# Patient Record
Sex: Male | Born: 1955
Health system: Southern US, Community
[De-identification: ages and names within clinical notes are randomized; demographics above are authoritative.]

## PROBLEM LIST (undated history)

## (undated) DIAGNOSIS — I10 Essential (primary) hypertension: Secondary | ICD-10-CM

## (undated) DIAGNOSIS — M1990 Unspecified osteoarthritis, unspecified site: Secondary | ICD-10-CM

---

## 2001-02-24 ENCOUNTER — Ambulatory Visit (HOSPITAL_COMMUNITY): Admission: RE | Admit: 2001-02-24 | Discharge: 2001-02-24 | Payer: Self-pay | Admitting: *Deleted

## 2004-09-23 ENCOUNTER — Emergency Department (HOSPITAL_COMMUNITY): Admission: EM | Admit: 2004-09-23 | Discharge: 2004-09-23 | Payer: Self-pay | Admitting: Emergency Medicine

## 2005-03-31 ENCOUNTER — Encounter: Admission: RE | Admit: 2005-03-31 | Discharge: 2005-03-31 | Payer: Self-pay | Admitting: Internal Medicine

## 2009-07-07 ENCOUNTER — Ambulatory Visit (HOSPITAL_COMMUNITY): Admission: RE | Admit: 2009-07-07 | Discharge: 2009-07-07 | Payer: Self-pay | Admitting: *Deleted

## 2009-07-07 ENCOUNTER — Encounter (INDEPENDENT_AMBULATORY_CARE_PROVIDER_SITE_OTHER): Payer: Self-pay | Admitting: *Deleted

## 2011-05-04 NOTE — Op Note (Signed)
NAME:  Tim [name], Tim [name] NO.:  [id number]   MEDICAL RECORD NO.:  [id number]          PATIENT TYPE:  AMB   LOCATION:  ENDO                         FACILITY:  [facility]   PHYSICIAN:  Georgiana Spinner, M.D.    DATE OF BIRTH:  10/05/1956   DATE OF PROCEDURE:  07/07/2009  DATE OF DISCHARGE:                               OPERATIVE REPORT   PROCEDURE:  Colonoscopy.   INDICATIONS:  Colon polyps.   ANESTHESIA:  1. Fentanyl 50 mcg.  2. Versed 5 mg.   PROCEDURE:  With the patient mildly sedated in the left lateral  decubitus position, a rectal examination was attempted.  Subsequently,  the Pentax videoscopic pediatric colonoscope was inserted in the rectum  and passed under direct vision to the cecum identified by the ileocecal  valve and appendiceal orifice, both of which were photographed. From  this point, the colonoscope was slowly withdrawn taking circumferential  views of the colonic mucosa, stopping only in the transverse colon were  a polyp was seen, photographed, and removed using snare cautery  technique.  The setting was 20/150 blended current.  Tissue was  retrieved by suctioning it into the endoscope into a tissue trap.  The  endoscope was then withdrawn all the way to the rectum, which appeared  normal on direct and showed hemorrhoids on retroflexed view.  The  endoscope was straightened and withdrawn.  The patient's vital signs and  pulse oximeter remained stable.  The patient tolerated the procedure  well with no apparent complication.   FINDINGS:  Polyp of distal transverse colon, internal hemorrhoids;  otherwise, unremarkable exam.  Await biopsy report.  The patient will  call me for results and follow up with me as an outpatient as needed.           ______________________________  Georgiana Spinner, M.D.     GMO/MEDQ  D:  07/07/2009  T:  07/07/2009  Job:  [id number]

## 2011-05-07 NOTE — Procedures (Signed)
[facility]  Patient:    Tim [name], Tim [name]                         MRN: [id number] Proc. Date: 02/24/01 Adm. Date:  [id number] Attending:  Sabino Gasser                           Procedure Report  PROCEDURE:  Colonoscopy.  INDICATIONS FOR PROCEDURE:  Hemoccult positivity.  ANESTHESIA:  Demerol 25, Versed 2 extra milligrams.  DESCRIPTION OF PROCEDURE:  With the patient mildly sedated in the left lateral decubitus position, the Olympus videoscopic colonoscope was inserted in the rectum and passed under direct vision to the cecum. The cecum identified by the ileocecal valve and appendiceal orifice both of which were photographed. From this point, the colonoscope was slowly withdrawn taking circumferential views of the entire colonic mucosa stopping only in the rectum which appeared normal in direct and showed internal hemorrhoids on retroflexed view. The endoscope was straightened and withdrawn. The patients vital signs and pulse oximeter stable. The patient tolerated the procedure well without apparent complications.  FINDINGS:  Internal hemorrhoids otherwise unremarkable colonoscopic examination to the cecum.  PLAN:  Have the patient follow-up with me as needed. DD:  02/24/01 TD:  02/25/01 Job: [id number]

## 2011-05-07 NOTE — Procedures (Signed)
[facility]  Patient:    FIN, HUPP                         MRN: [id number] Proc. Date: 02/24/01 Adm. Date:  [id number] Attending:  Sabino Gasser                           Procedure Report  PROCEDURE:  Upper endoscopy.  INDICATION FOR PROCEDURE:  Hemoccult positivity.  ANESTHESIA:  Demerol 50 mg, Versed 5 mg.  DESCRIPTION OF PROCEDURE:  With the patient mildly sedated in the left lateral decubitus position, the Olympus video endoscope was inserted in the mouth and passed under direct vision through the esophagus which appeared normal into the stomach. The fundus, body, antrum, duodenal bulb, and second portion of the duodenum all appeared normal. From this point, the endoscope was slowly withdrawn taking circumferential views of the entire duodenal mucosa until the endoscope was then pulled back into the stomach, placed in retroflexion to view the stomach from below and this too appeared normal. The endoscope was then straightened and withdrawn taking circumferential views of the entire gastric and esophageal mucosa which also appeared normal. The patients vital signs and pulse oximeter remained stable. The patient tolerated the procedure well and there were no apparent complications.  FINDINGS:  Essentially negative endoscopic examination.  PLAN:  Proceed to colonoscopy. DD:  02/24/01 TD:  02/25/01 Job: [id number]

## 2011-10-19 ENCOUNTER — Other Ambulatory Visit: Payer: Self-pay | Admitting: Internal Medicine

## 2011-10-19 ENCOUNTER — Ambulatory Visit
Admission: RE | Admit: 2011-10-19 | Discharge: 2011-10-19 | Disposition: A | Discharge status: Home / Self Care | Payer: Managed Care, Other (non HMO) | Source: Ambulatory Visit | Attending: Internal Medicine | Admitting: Internal Medicine

## 2011-10-19 MED ORDER — IOHEXOL 300 MG/ML  SOLN
100.0000 mL | Freq: Once | INTRAMUSCULAR | Status: AC | PRN
  Administered 2011-10-19: 100 mL via INTRAVENOUS

## 2015-07-10 ENCOUNTER — Ambulatory Visit: Payer: Self-pay | Admitting: Orthopedic Surgery

## 2015-07-10 NOTE — Progress Notes (Signed)
Preoperative surgical orders have been place into the Epic hospital system for Tim [name] on 07/10/2015, 6:07 PM  by Patrica Duel for surgery on 07/28/2015.  Preop Total Knee orders including Experal, IV Tylenol, and IV Decadron as long as there are no contraindications to the above medications. Avel Peace, PA-C

## 2015-07-17 NOTE — Patient Instructions (Addendum)
Tim [name]  07/17/2015   Your procedure is scheduled on:  Monday 07/28/2015  Report to [facility] Main  Entrance take [facility]  elevators to 3rd floor to  Short Stay Center at  1045 AM.  Call this number if you have problems the morning of surgery [phone]   Remember: ONLY 1 PERSON MAY GO WITH YOU TO SHORT STAY TO GET  READY MORNING OF YOUR SURGERY.   Do not eat food  :After Midnight. MAY HAVE CLEAR LIQUIDS FROM MIDNIGHT UP UNTIL 0745 AM THEN NOTHING UNTIL AFTER SURGERY!               Eat a good healthy snack prior to bedtime.   Take these medicines the morning of surgery with A SIP OF WATER: AMLODIPINE                               You may not have any metal on your body including hair pins and              piercings  Do not wear jewelry,  lotions, powders or perfumes, deodorant                         Men may shave face and neck.   Do not bring valuables to the hospital. [facility] IS NOT             RESPONSIBLE   FOR VALUABLES.  Contacts, dentures or bridgework may not be worn into surgery.  Leave suitcase in the car. After surgery it may be brought to your room.         Special Instructions: coughing and deep breathing exercises               Please read over the following fact sheets you were given: _____________________________________________________________________             [facility] - Preparing for Surgery Before surgery, you can play an important role.  Because skin is not sterile, your skin needs to be as free of germs as possible.  You can reduce the number of germs on your skin by washing with CHG (chlorahexidine gluconate) soap before surgery.  CHG is an antiseptic cleaner which kills germs and bonds with the skin to continue killing germs even after washing. Please DO NOT use if you have an allergy to CHG or antibacterial soaps.  If your skin becomes reddened/irritated stop using the CHG and inform your nurse when you arrive at Short  Stay. Do not shave (including legs and underarms) for at least 48 hours prior to the first CHG shower.  You may shave your face/neck. Please follow these instructions carefully:  1.  Shower with CHG Soap the night before surgery and the  morning of Surgery.  2.  If you choose to wash your hair, wash your hair first as usual with your  normal  shampoo.  3.  After you shampoo, rinse your hair and body thoroughly to remove the  shampoo.                           4.  Use CHG as you would any other liquid soap.  You can apply chg directly  to the skin and wash  Gently with a scrungie or clean washcloth.  5.  Apply the CHG Soap to your body ONLY FROM THE NECK DOWN.   Do not use on face/ open                           Wound or open sores. Avoid contact with eyes, ears mouth and genitals (private parts).                       Wash face,  Genitals (private parts) with your normal soap.             6.  Wash thoroughly, paying special attention to the area where your surgery  will be performed.  7.  Thoroughly rinse your body with warm water from the neck down.  8.  DO NOT shower/wash with your normal soap after using and rinsing off  the CHG Soap.                9.  Pat yourself dry with a clean towel.            10.  Wear clean pajamas.            11.  Place clean sheets on your bed the night of your first shower and do not  sleep with pets. Day of Surgery : Do not apply any lotions/deodorants the morning of surgery.  Please wear clean clothes to the hospital/surgery center.  FAILURE TO FOLLOW THESE INSTRUCTIONS MAY RESULT IN THE CANCELLATION OF YOUR SURGERY PATIENT SIGNATURE_________________________________  NURSE SIGNATURE__________________________________  ________________________________________________________________________   Tim [name]  An incentive spirometer is a tool that can help keep your lungs clear and active. This tool measures how well you are  filling your lungs with each breath. Taking long deep breaths may help reverse or decrease the chance of developing breathing (pulmonary) problems (especially infection) following:  A long period of time when you are unable to move or be active. BEFORE THE PROCEDURE   If the spirometer includes an indicator to show your best effort, your nurse or respiratory therapist will set it to a desired goal.  If possible, sit up straight or lean slightly forward. Try not to slouch.  Hold the incentive spirometer in an upright position. INSTRUCTIONS FOR USE   Sit on the edge of your bed if possible, or sit up as far as you can in bed or on a chair.  Hold the incentive spirometer in an upright position.  Breathe out normally.  Place the mouthpiece in your mouth and seal your lips tightly around it.  Breathe in slowly and as deeply as possible, raising the piston or the ball toward the top of the column.  Hold your breath for 3-5 seconds or for as long as possible. Allow the piston or ball to fall to the bottom of the column.  Remove the mouthpiece from your mouth and breathe out normally.  Rest for a few seconds and repeat Steps 1 through 7 at least 10 times every 1-2 hours when you are awake. Take your time and take a few normal breaths between deep breaths.  The spirometer may include an indicator to show your best effort. Use the indicator as a goal to work toward during each repetition.  After each set of 10 deep breaths, practice coughing to be sure your lungs are clear. If you have an incision (the cut made at the time of surgery),  support your incision when coughing by placing a pillow or rolled up towels firmly against it. Once you are able to get out of bed, walk around indoors and cough well. You may stop using the incentive spirometer when instructed by your caregiver.  RISKS AND COMPLICATIONS  Take your time so you do not get dizzy or light-headed.  If you are in pain, you may  need to take or ask for pain medication before doing incentive spirometry. It is harder to take a deep breath if you are having pain. AFTER USE  Rest and breathe slowly and easily.  It can be helpful to keep track of a log of your progress. Your caregiver can provide you with a simple table to help with this. If you are using the spirometer at home, follow these instructions: [place] IF:   You are having difficultly using the spirometer.  You have trouble using the spirometer as often as instructed.  Your pain medication is not giving enough relief while using the spirometer.  You develop fever of 100.5 F (38.1 C) or higher. SEEK IMMEDIATE MEDICAL CARE IF:   You cough up bloody sputum that had not been present before.  You develop fever of 102 F (38.9 C) or greater.  You develop worsening pain at or near the incision site. MAKE SURE YOU:   Understand these instructions.  Will watch your condition.  Will get help right away if you are not doing well or get worse. Document Released: 04/18/2007 Document Revised: 02/28/2012 Document Reviewed: 06/19/2007 ExitCare Patient Information 2014 ExitCare, [place].   ________________________________________________________________________  WHAT IS A BLOOD TRANSFUSION? Blood Transfusion Information  A transfusion is the replacement of blood or some of its parts. Blood is made up of multiple cells which provide different functions.  Red blood cells carry oxygen and are used for blood loss replacement.  White blood cells fight against infection.  Platelets control bleeding.  Plasma helps clot blood.  Other blood products are available for specialized needs, such as hemophilia or other clotting disorders. BEFORE THE TRANSFUSION  Who gives blood for transfusions?   Healthy volunteers who are fully evaluated to make sure their blood is safe. This is blood bank blood. Transfusion therapy is the safest it has ever been in  the practice of medicine. Before blood is taken from a donor, a complete history is taken to make sure that person has no history of diseases nor engages in risky social behavior (examples are intravenous drug use or sexual activity with multiple partners). The donor's travel history is screened to minimize risk of transmitting infections, such as malaria. The donated blood is tested for signs of infectious diseases, such as HIV and hepatitis. The blood is then tested to be sure it is compatible with you in order to minimize the chance of a transfusion reaction. If you or a relative donates blood, this is often done in anticipation of surgery and is not appropriate for emergency situations. It takes many days to process the donated blood. RISKS AND COMPLICATIONS Although transfusion therapy is very safe and saves many lives, the main dangers of transfusion include:   Getting an infectious disease.  Developing a transfusion reaction. This is an allergic reaction to something in the blood you were given. Every precaution is taken to prevent this. The decision to have a blood transfusion has been considered carefully by your caregiver before blood is given. Blood is not given unless the benefits outweigh the risks. AFTER THE TRANSFUSION  Right after receiving a blood transfusion, you will usually feel much better and more energetic. This is especially true if your red blood cells have gotten low (anemic). The transfusion raises the level of the red blood cells which carry oxygen, and this usually causes an energy increase.  The nurse administering the transfusion will monitor you carefully for complications. HOME CARE INSTRUCTIONS  No special instructions are needed after a transfusion. You may find your energy is better. Speak with your caregiver about any limitations on activity for underlying diseases you may have. SEEK MEDICAL CARE IF:   Your condition is not improving after your  transfusion.  You develop redness or irritation at the intravenous (IV) site. SEEK IMMEDIATE MEDICAL CARE IF:  Any of the following symptoms occur over the next 12 hours:  Shaking chills.  You have a temperature by mouth above 102 F (38.9 C), not controlled by medicine.  Chest, back, or muscle pain.  People around you feel you are not acting correctly or are confused.  Shortness of breath or difficulty breathing.  Dizziness and fainting.  You get a rash or develop hives.  You have a decrease in urine output.  Your urine turns a dark color or changes to pink, red, or brown. Any of the following symptoms occur over the next 10 days:  You have a temperature by mouth above 102 F (38.9 C), not controlled by medicine.  Shortness of breath.  Weakness after normal activity.  The white part of the eye turns yellow (jaundice).  You have a decrease in the amount of urine or are urinating less often.  Your urine turns a dark color or changes to pink, red, or brown. Document Released: 12/03/2000 Document Revised: 02/28/2012 Document Reviewed: 07/22/2008 ExitCare Patient Information 2014 ExitCare, [place].  _______________________________________________________________________   CLEAR LIQUID DIET   Foods Allowed                                                                     Foods Excluded  Coffee and tea, regular and decaf                             liquids that you cannot  Plain Jell-O in any flavor                                             see through such as: Fruit ices (not with fruit pulp)                                     milk, soups, orange juice  Iced Popsicles                                    All solid food Carbonated beverages, regular and diet  Cranberry, grape and apple juices Sports drinks like Gatorade Lightly seasoned clear broth or consume(fat free) Sugar, honey syrup  Sample Menu Breakfast                                 Lunch                                     Supper Cranberry juice                    Beef broth                            Chicken broth Jell-O                                     Grape juice                           Apple juice Coffee or tea                        Jell-O                                      Popsicle                                                Coffee or tea                        Coffee or tea  _____________________________________________________________________

## 2015-07-17 NOTE — Progress Notes (Signed)
04/04/2015-Pre-operative clearance note from Dr. Nicholos Johns on chart.

## 2015-07-21 ENCOUNTER — Encounter (HOSPITAL_COMMUNITY): Payer: Self-pay

## 2015-07-21 ENCOUNTER — Encounter (HOSPITAL_COMMUNITY)
Admission: RE | Admit: 2015-07-21 | Discharge: 2015-07-21 | Disposition: A | Discharge status: Home / Self Care | Payer: Managed Care, Other (non HMO) | Source: Ambulatory Visit | Attending: Orthopedic Surgery | Admitting: Orthopedic Surgery

## 2015-07-21 DIAGNOSIS — M179 Osteoarthritis of knee, unspecified: Secondary | ICD-10-CM | POA: Insufficient documentation

## 2015-07-21 DIAGNOSIS — Z1812 Retained nonmagnetic metal fragments: Secondary | ICD-10-CM | POA: Insufficient documentation

## 2015-07-21 DIAGNOSIS — Z01810 Encounter for preprocedural cardiovascular examination: Secondary | ICD-10-CM | POA: Diagnosis present

## 2015-07-21 DIAGNOSIS — Z01812 Encounter for preprocedural laboratory examination: Secondary | ICD-10-CM | POA: Diagnosis present

## 2015-07-21 HISTORY — DX: Unspecified osteoarthritis, unspecified site: M19.90

## 2015-07-21 HISTORY — DX: Essential (primary) hypertension: I10

## 2015-07-21 LAB — PROTIME-INR
INR: 1.07 (ref 0.00–1.49)
PROTHROMBIN TIME: 14.1 s (ref 11.6–15.2)

## 2015-07-21 LAB — URINALYSIS, ROUTINE W REFLEX MICROSCOPIC
Bilirubin Urine: NEGATIVE
Glucose, UA: NEGATIVE mg/dL
Hgb urine dipstick: NEGATIVE
Ketones, ur: NEGATIVE mg/dL
Leukocytes, UA: NEGATIVE
Nitrite: NEGATIVE
Protein, ur: NEGATIVE mg/dL
SPECIFIC GRAVITY, URINE: 1.023 (ref 1.005–1.030)
UROBILINOGEN UA: 0.2 mg/dL (ref 0.0–1.0)
pH: 5.5 (ref 5.0–8.0)

## 2015-07-21 LAB — COMPREHENSIVE METABOLIC PANEL
ALT: 34 U/L (ref 17–63)
AST: 37 U/L (ref 15–41)
Albumin: 4.4 g/dL (ref 3.5–5.0)
Alkaline Phosphatase: 75 U/L (ref 38–126)
Anion gap: 7 (ref 5–15)
BUN: 13 mg/dL (ref 6–20)
CO2: 25 mmol/L (ref 22–32)
Calcium: 9.1 mg/dL (ref 8.9–10.3)
Chloride: 104 mmol/L (ref 101–111)
Creatinine, Ser: 1.23 mg/dL (ref 0.61–1.24)
GFR calc Af Amer: >60 mL/min (ref >60)
GFR calc non Af Amer: >60 mL/min (ref >60)
Glucose, Bld: 110 mg/dL — ABNORMAL HIGH (ref 65–99)
Potassium: 4 mmol/L (ref 3.5–5.1)
SODIUM: 136 mmol/L (ref 135–145)
Total Bilirubin: 0.4 mg/dL (ref 0.3–1.2)
Total Protein: 7.6 g/dL (ref 6.5–8.1)

## 2015-07-21 LAB — CBC
HEMATOCRIT: 40.7 % (ref 39.0–52.0)
HEMOGLOBIN: 13.1 g/dL (ref 13.0–17.0)
MCH: 24.8 pg — AB (ref 26.0–34.0)
MCHC: 32.2 g/dL (ref 30.0–36.0)
MCV: 77.1 fL — ABNORMAL LOW (ref 78.0–100.0)
Platelets: 234 10*3/uL (ref 150–400)
RBC: 5.28 MIL/uL (ref 4.22–5.81)
RDW: 13.8 % (ref 11.5–15.5)
WBC: 4.7 10*3/uL (ref 4.0–10.5)

## 2015-07-21 LAB — SURGICAL PCR SCREEN
MRSA, PCR: NEGATIVE
STAPHYLOCOCCUS AUREUS: NEGATIVE

## 2015-07-21 LAB — ABO/RH: ABO/RH(D): A POS

## 2015-07-21 LAB — APTT: aPTT: 34 seconds (ref 24–37)

## 2015-07-21 NOTE — Progress Notes (Signed)
04/04/2015- preop clearance note from Dr Nicholos Johns on chart

## 2015-07-21 NOTE — Progress Notes (Signed)
Final EKG done 07/21/15 in EPIC  

## 2015-07-27 ENCOUNTER — Ambulatory Visit: Payer: Self-pay | Admitting: Orthopedic Surgery

## 2015-07-27 MED ORDER — DEXTROSE 5 % IV SOLN
3.0000 g | INTRAVENOUS | Status: AC
  Administered 2015-07-28: 3 g via INTRAVENOUS
  Filled 2015-07-27 (×2): qty 3000

## 2015-07-27 NOTE — H&P (Signed)
[name] DOB: 1956/09/23 Married / Language: English / Race: Black or African American Male Date of Admission:  07/28/2015 CC:  Left [name] History of Present Illness The patient is a 59 year old male who comes in for a preoperative History and Physical. The patient is scheduled for a left total knee arthroplasty to be performed by Dr. Gus [name]. Aluisio, MD at [facility] on 07/28/2015. The patient is a 59 year old male who presented with knee complaints. The patient was seen for a second opinion. The patient reports left knee symptoms including: [name] . Prior to being seen, the patient was previously evaluated by a colleague 5 year(s) ago (to 6). Previous work-up for this problem has included knee x-rays. Past treatment for this problem has included intra-articular injection of corticosteroids (and viscosupplementation (Supartz)). Note for "[name]": He has not had any surgery on the knee. He states that the left knee is getting progressively worse with time. His last injection was approximately 4 to 5 years ago. He has been treated in the past at [name] by Dr. Althea [name] and Dr. Ave [name]. He states that his left knee has gotten progressively worse over time. It is now bothering him with all activities. He even has some discomfort at rest. It is definitely limiting what he can and cannot do. He has lost a lot of motion in the knee. He is at a stage, where he wants to discuss treatment options of the knee. He is not having any hip [name]. He is not having lower extremity paresthesia. He has occasional [name] in his right knee, but nowhere near as bad as the left. he was foudn to have osteoarthritis of the left knee and felt to benefit from undergoing a total knee replacement. They have been treated conservatively in the past for the above stated problem and despite conservative measures, they continue to have progressive [name] and severe functional limitations and dysfunction. They have  failed non-operative management including home exercise, medications, and injections. It is felt that they would benefit from undergoing total joint replacement. Risks and benefits of the procedure have been discussed with the patient and they elect to proceed with surgery. There are no active contraindications to surgery such as ongoing infection or rapidly progressive neurological disease.  Problem List/Past Medical  Primary osteoarthritis of left knee (M17.12) Hypertension Hypercholesterolemia  Allergies ACE Inhibitors Swelling. Mouth  Social History Not under [name] contract No history of drug/alcohol rehab Marital status married Tobacco use Former smoker. 04/03/2015: smoke(d) less than 1/2 pack(s) per day Number of flights of stairs before winded less than 1 Current work status working full time Exercise Exercises weekly; does other Living situation live with spouse Former drinker 04/03/2015: In the past drank beer less than 5 times per week  Medication History Multivitamin (Oral) Active. Ginkgo Biloba (Oral) Active. Glucosamine Complex (Oral) Active. Valsartan-Hydrochlorothiazide (160-12.5MG  Tablet, Oral) Active. AmLODIPine Besylate (5MG  Tablet, Oral) Active. MetFORMIN HCl ER (500MG  Tablet ER 24HR, Oral) Active. Atorvastatin Calcium (20MG  Tablet, Oral) Active. Saw Palmetto (1000MG  Capsule, Oral) Active. Aspirin (81MG  Tablet Chewable, Oral) Active.  Past Surgical History No pertinent past surgical history  Review of Systems General Not Present- Chills, Fatigue, Fever, Memory Loss, Night Sweats, Weight Gain and Weight Loss. Skin Not Present- Eczema, Hives, Itching, Lesions and Rash. HEENT Not Present- Dentures, Double Vision, Headache, Hearing Loss, Tinnitus and Visual Loss. Respiratory Not Present- Allergies, Chronic Cough, Coughing up blood, Shortness of breath at rest and Shortness of breath with  exertion. Cardiovascular Not Present- Chest [name],  Difficulty Breathing Lying Down, Murmur, Palpitations, Racing/skipping heartbeats and Swelling. Gastrointestinal Not Present- Abdominal [name], Bloody Stool, Constipation, Diarrhea, Difficulty Swallowing, Heartburn, Jaundice, Loss of appetitie, Nausea and Vomiting. Male Genitourinary Not Present- Blood in Urine, Discharge, Flank [name], Incontinence, Painful Urination, Urgency, Urinary frequency, Urinary Retention, Urinating at Night and Weak urinary stream. Musculoskeletal Present- Joint [name]. Not Present- Back [name], Joint Swelling, Morning Stiffness, Muscle [name], Muscle Weakness and Spasms. Neurological Not Present- Blackout spells, Difficulty with balance, Dizziness, Paralysis, Tremor and Weakness. Psychiatric Not Present- Insomnia.  Vitals Weight: 270 lb Height: 75in Weight was reported by patient. Height was reported by patient. Body Surface Area: 2.49 m Body Mass Index: 33.75 kg/m  BP: 118/72 (Sitting, Right Arm, Standard)  Physical Exam General Mental Status -Alert, cooperative and good historian. General Appearance-pleasant, Not in acute distress. Orientation-Oriented X3. Build & Nutrition-Well nourished and Well developed.  Head and Neck Head-normocephalic, atraumatic . Neck Global Assessment - supple, no bruit auscultated on the right, no bruit auscultated on the left.  Eye Pupil - Bilateral-Regular and Round. Motion - Bilateral-EOMI.  Chest and Lung Exam Auscultation Breath sounds - clear at anterior chest wall and clear at posterior chest wall. Adventitious sounds - No Adventitious sounds.  Cardiovascular Auscultation Rhythm - Regular rate and rhythm. Heart Sounds - S1 WNL and S2 WNL. Murmurs & Other Heart Sounds - Auscultation of the heart reveals - No Murmurs.  Abdomen Palpation/Percussion Tenderness - Abdomen is non-tender to palpation. Rigidity (guarding) - Abdomen is soft. Auscultation Auscultation of the abdomen reveals - Bowel sounds  normal.  Male Genitourinary Note: Not done, not pertinent to present illness  Musculoskeletal Note: Well-developed male, alert and oriented, in no apparent distress. Evaluation of his hips shows normal range of motion with no discomfort. The right knee shows no effusion. Range of motion of the right knee is approximately 0 to 125 and 130. He has slight crepitus on range of motion, minimal tenderness, and no instability. Left knee, no effusion; a lot of hypertrophic changes around the left knee. His range is about 5 to 90. He cannot flex it past 90. There is no instability about the knee. He is tender medial greater than the lateral.  RADIOGRAPHS AP both knees and lateral shows severe medial and patellofemoral arthritis of the left knee with massive osteophyte formation in both the patellofemoral and medial compartments. He has minimal change in the right knee.  Assessment & Plan  Primary osteoarthritis of left knee (M17.12) Note:Surgical Plans: Left Total Knee Replacement  Disposition: Home  PCP: Dr. Nicholos Johns - Patient has been seen preoperatively and felt to be stable for surgery.  IV TXA  Anesthesia Issues: None  Signed electronically by Lauraine Rinne, III PA-C

## 2015-07-28 ENCOUNTER — Encounter (HOSPITAL_COMMUNITY)
Admission: RE | Disposition: A | Discharge status: Home Health | Payer: Self-pay | Source: Ambulatory Visit | Attending: Orthopedic Surgery

## 2015-07-28 ENCOUNTER — Encounter (HOSPITAL_COMMUNITY): Payer: Self-pay | Admitting: *Deleted

## 2015-07-28 ENCOUNTER — Inpatient Hospital Stay (HOSPITAL_COMMUNITY): Payer: Managed Care, Other (non HMO) | Admitting: Anesthesiology

## 2015-07-28 ENCOUNTER — Inpatient Hospital Stay (HOSPITAL_COMMUNITY)
Admission: RE | Admit: 2015-07-28 | Discharge: 2015-07-30 | DRG: 470 | Disposition: A | Discharge status: Home Health | Payer: Managed Care, Other (non HMO) | Source: Ambulatory Visit | Attending: Orthopedic Surgery | Admitting: Orthopedic Surgery

## 2015-07-28 DIAGNOSIS — Z7982 Long term (current) use of aspirin: Secondary | ICD-10-CM | POA: Diagnosis not present

## 2015-07-28 DIAGNOSIS — I10 Essential (primary) hypertension: Secondary | ICD-10-CM | POA: Diagnosis present

## 2015-07-28 DIAGNOSIS — E119 Type 2 diabetes mellitus without complications: Secondary | ICD-10-CM | POA: Diagnosis present

## 2015-07-28 DIAGNOSIS — Z79899 Other long term (current) drug therapy: Secondary | ICD-10-CM | POA: Diagnosis not present

## 2015-07-28 DIAGNOSIS — Z01812 Encounter for preprocedural laboratory examination: Secondary | ICD-10-CM | POA: Diagnosis not present

## 2015-07-28 DIAGNOSIS — M1712 Unilateral primary osteoarthritis, left knee: Secondary | ICD-10-CM | POA: Diagnosis present

## 2015-07-28 DIAGNOSIS — E780 Pure hypercholesterolemia: Secondary | ICD-10-CM | POA: Diagnosis present

## 2015-07-28 DIAGNOSIS — M25562 Pain in left knee: Secondary | ICD-10-CM | POA: Diagnosis present

## 2015-07-28 DIAGNOSIS — M179 Osteoarthritis of knee, unspecified: Secondary | ICD-10-CM | POA: Diagnosis present

## 2015-07-28 DIAGNOSIS — M1710 Unilateral primary osteoarthritis, unspecified knee: Secondary | ICD-10-CM | POA: Diagnosis present

## 2015-07-28 HISTORY — PX: TOTAL KNEE ARTHROPLASTY: SHX125

## 2015-07-28 LAB — TYPE AND SCREEN
ABO/RH(D): A POS
Antibody Screen: NEGATIVE

## 2015-07-28 LAB — GLUCOSE, CAPILLARY
GLUCOSE-CAPILLARY: 88 mg/dL (ref 65–99)
Glucose-Capillary: 103 mg/dL — ABNORMAL HIGH (ref 65–99)

## 2015-07-28 SURGERY — ARTHROPLASTY, KNEE, TOTAL
Anesthesia: Spinal | Site: Knee | Laterality: Left

## 2015-07-28 MED ORDER — DEXAMETHASONE SODIUM PHOSPHATE 10 MG/ML IJ SOLN
10.0000 mg | Freq: Once | INTRAMUSCULAR | Status: AC
  Administered 2015-07-28: 10 mg via INTRAVENOUS

## 2015-07-28 MED ORDER — LACTATED RINGERS IV SOLN
INTRAVENOUS | Status: DC
  Administered 2015-07-28: 1000 mL via INTRAVENOUS
  Administered 2015-07-28 (×2): via INTRAVENOUS

## 2015-07-28 MED ORDER — BUPIVACAINE HCL 0.25 % IJ SOLN
INTRAMUSCULAR | Status: DC | PRN
  Administered 2015-07-28: 20 mL

## 2015-07-28 MED ORDER — LACTATED RINGERS IV SOLN: INTRAVENOUS | Status: DC

## 2015-07-28 MED ORDER — MIDAZOLAM HCL 2 MG/2ML IJ SOLN
INTRAMUSCULAR | Status: AC
  Filled 2015-07-28: qty 2

## 2015-07-28 MED ORDER — FENTANYL CITRATE (PF) 100 MCG/2ML IJ SOLN
INTRAMUSCULAR | Status: AC
  Filled 2015-07-28: qty 4

## 2015-07-28 MED ORDER — ATORVASTATIN CALCIUM 20 MG PO TABS
20.0000 mg | ORAL_TABLET | Freq: Every day | ORAL | Status: DC
  Administered 2015-07-29 – 2015-07-30 (×2): 20 mg via ORAL
  Filled 2015-07-28 (×2): qty 1

## 2015-07-28 MED ORDER — PROPOFOL 10 MG/ML IV BOLUS
INTRAVENOUS | Status: AC
  Filled 2015-07-28: qty 20

## 2015-07-28 MED ORDER — MIDAZOLAM HCL 5 MG/5ML IJ SOLN
INTRAMUSCULAR | Status: DC | PRN
  Administered 2015-07-28: 2 mg via INTRAVENOUS

## 2015-07-28 MED ORDER — MORPHINE SULFATE 2 MG/ML IJ SOLN
1.0000 mg | INTRAMUSCULAR | Status: DC | PRN
  Administered 2015-07-28 (×2): 1 mg via INTRAVENOUS
  Filled 2015-07-28 (×2): qty 1

## 2015-07-28 MED ORDER — TRAMADOL HCL 50 MG PO TABS: 50.0000 mg | ORAL_TABLET | Freq: Four times a day (QID) | ORAL | Status: DC | PRN

## 2015-07-28 MED ORDER — KETOROLAC TROMETHAMINE 15 MG/ML IJ SOLN: 7.5000 mg | Freq: Four times a day (QID) | INTRAMUSCULAR | Status: AC | PRN

## 2015-07-28 MED ORDER — FLEET ENEMA 7-19 GM/118ML RE ENEM: 1.0000 | ENEMA | Freq: Once | RECTAL | Status: DC | PRN

## 2015-07-28 MED ORDER — METFORMIN HCL ER 500 MG PO TB24
500.0000 mg | ORAL_TABLET | Freq: Two times a day (BID) | ORAL | Status: DC
Start: 2015-07-29 — End: 2015-07-29
  Administered 2015-07-29: 500 mg via ORAL
  Filled 2015-07-28 (×3): qty 1

## 2015-07-28 MED ORDER — METHOCARBAMOL 1000 MG/10ML IJ SOLN
500.0000 mg | Freq: Four times a day (QID) | INTRAVENOUS | Status: DC | PRN
  Filled 2015-07-28: qty 5

## 2015-07-28 MED ORDER — ONDANSETRON HCL 4 MG PO TABS: 4.0000 mg | ORAL_TABLET | Freq: Four times a day (QID) | ORAL | Status: DC | PRN

## 2015-07-28 MED ORDER — ACETAMINOPHEN 500 MG PO TABS
1000.0000 mg | ORAL_TABLET | Freq: Four times a day (QID) | ORAL | Status: AC
  Administered 2015-07-28 – 2015-07-29 (×4): 1000 mg via ORAL
  Filled 2015-07-28 (×4): qty 2

## 2015-07-28 MED ORDER — PROPOFOL INFUSION 10 MG/ML OPTIME
INTRAVENOUS | Status: DC | PRN
  Administered 2015-07-28: 100 ug/kg/min via INTRAVENOUS

## 2015-07-28 MED ORDER — ACETAMINOPHEN 650 MG RE SUPP: 650.0000 mg | Freq: Four times a day (QID) | RECTAL | Status: DC | PRN

## 2015-07-28 MED ORDER — DEXAMETHASONE SODIUM PHOSPHATE 10 MG/ML IJ SOLN
INTRAMUSCULAR | Status: AC
  Filled 2015-07-28: qty 1

## 2015-07-28 MED ORDER — ACETAMINOPHEN 325 MG PO TABS
650.0000 mg | ORAL_TABLET | Freq: Four times a day (QID) | ORAL | Status: DC | PRN
  Administered 2015-07-29: 650 mg via ORAL
  Filled 2015-07-28: qty 2

## 2015-07-28 MED ORDER — POTASSIUM CHLORIDE IN NACL 20-0.9 MEQ/L-% IV SOLN
INTRAVENOUS | Status: DC
  Administered 2015-07-28: 20:00:00 via INTRAVENOUS
  Filled 2015-07-28 (×4): qty 1000

## 2015-07-28 MED ORDER — POLYETHYLENE GLYCOL 3350 17 G PO PACK: 17.0000 g | PACK | Freq: Every day | ORAL | Status: DC | PRN

## 2015-07-28 MED ORDER — FENTANYL CITRATE (PF) 100 MCG/2ML IJ SOLN
INTRAMUSCULAR | Status: DC | PRN
  Administered 2015-07-28: 100 ug via INTRAVENOUS

## 2015-07-28 MED ORDER — BUPIVACAINE LIPOSOME 1.3 % IJ SUSP
INTRAMUSCULAR | Status: DC | PRN
  Administered 2015-07-28: 20 mL

## 2015-07-28 MED ORDER — CHLORHEXIDINE GLUCONATE 4 % EX LIQD: 60.0000 mL | Freq: Once | CUTANEOUS | Status: DC

## 2015-07-28 MED ORDER — DIPHENHYDRAMINE HCL 12.5 MG/5ML PO ELIX: 12.5000 mg | ORAL_SOLUTION | ORAL | Status: DC | PRN

## 2015-07-28 MED ORDER — OXYCODONE HCL 5 MG PO TABS
5.0000 mg | ORAL_TABLET | ORAL | Status: DC | PRN
  Administered 2015-07-28: 10 mg via ORAL
  Administered 2015-07-28: 5 mg via ORAL
  Administered 2015-07-29 (×2): 10 mg via ORAL
  Administered 2015-07-29: 5 mg via ORAL
  Administered 2015-07-29 – 2015-07-30 (×4): 10 mg via ORAL
  Filled 2015-07-28: qty 2
  Filled 2015-07-28: qty 1
  Filled 2015-07-28 (×7): qty 2

## 2015-07-28 MED ORDER — 0.9 % SODIUM CHLORIDE (POUR BTL) OPTIME
TOPICAL | Status: DC | PRN
  Administered 2015-07-28: 1000 mL

## 2015-07-28 MED ORDER — TRANEXAMIC ACID 1000 MG/10ML IV SOLN
1000.0000 mg | INTRAVENOUS | Status: AC
  Administered 2015-07-28: 1000 mg via INTRAVENOUS
  Filled 2015-07-28: qty 10

## 2015-07-28 MED ORDER — RIVAROXABAN 10 MG PO TABS
10.0000 mg | ORAL_TABLET | Freq: Every day | ORAL | Status: DC
  Administered 2015-07-29 – 2015-07-30 (×2): 10 mg via ORAL
  Filled 2015-07-28 (×3): qty 1

## 2015-07-28 MED ORDER — CEFAZOLIN SODIUM-DEXTROSE 2-3 GM-% IV SOLR
2.0000 g | Freq: Four times a day (QID) | INTRAVENOUS | Status: AC
  Administered 2015-07-28 – 2015-07-29 (×2): 2 g via INTRAVENOUS
  Filled 2015-07-28 (×2): qty 50

## 2015-07-28 MED ORDER — METOCLOPRAMIDE HCL 10 MG PO TABS
5.0000 mg | ORAL_TABLET | Freq: Three times a day (TID) | ORAL | Status: DC | PRN
Start: 2015-07-28 — End: 2015-07-30

## 2015-07-28 MED ORDER — SODIUM CHLORIDE 0.9 % IR SOLN
Status: DC | PRN
  Administered 2015-07-28: 1000 mL

## 2015-07-28 MED ORDER — SODIUM CHLORIDE 0.9 % IJ SOLN
INTRAMUSCULAR | Status: AC
  Filled 2015-07-28: qty 50

## 2015-07-28 MED ORDER — METOCLOPRAMIDE HCL 5 MG/ML IJ SOLN: 5.0000 mg | Freq: Three times a day (TID) | INTRAMUSCULAR | Status: DC | PRN

## 2015-07-28 MED ORDER — ONDANSETRON HCL 4 MG/2ML IJ SOLN: 4.0000 mg | Freq: Four times a day (QID) | INTRAMUSCULAR | Status: DC | PRN

## 2015-07-28 MED ORDER — AMLODIPINE BESYLATE 5 MG PO TABS
5.0000 mg | ORAL_TABLET | Freq: Every day | ORAL | Status: DC
  Administered 2015-07-29 – 2015-07-30 (×2): 5 mg via ORAL
  Filled 2015-07-28 (×2): qty 1

## 2015-07-28 MED ORDER — BISACODYL 10 MG RE SUPP: 10.0000 mg | Freq: Every day | RECTAL | Status: DC | PRN

## 2015-07-28 MED ORDER — SODIUM CHLORIDE 0.9 % IJ SOLN
INTRAMUSCULAR | Status: DC | PRN
  Administered 2015-07-28: 30 mL via INTRAVENOUS

## 2015-07-28 MED ORDER — DOCUSATE SODIUM 100 MG PO CAPS
100.0000 mg | ORAL_CAPSULE | Freq: Two times a day (BID) | ORAL | Status: DC
  Administered 2015-07-30: 100 mg via ORAL

## 2015-07-28 MED ORDER — DEXAMETHASONE SODIUM PHOSPHATE 10 MG/ML IJ SOLN
10.0000 mg | Freq: Once | INTRAMUSCULAR | Status: AC
  Administered 2015-07-29: 10 mg via INTRAVENOUS
  Filled 2015-07-28: qty 1

## 2015-07-28 MED ORDER — PHENOL 1.4 % MT LIQD: 1.0000 | OROMUCOSAL | Status: DC | PRN

## 2015-07-28 MED ORDER — BUPIVACAINE LIPOSOME 1.3 % IJ SUSP
20.0000 mL | Freq: Once | INTRAMUSCULAR | Status: DC
  Filled 2015-07-28: qty 20

## 2015-07-28 MED ORDER — ACETAMINOPHEN 10 MG/ML IV SOLN
INTRAVENOUS | Status: AC
  Filled 2015-07-28: qty 100

## 2015-07-28 MED ORDER — HYDROMORPHONE HCL 1 MG/ML IJ SOLN: 0.2500 mg | INTRAMUSCULAR | Status: DC | PRN

## 2015-07-28 MED ORDER — ACETAMINOPHEN 10 MG/ML IV SOLN
1000.0000 mg | Freq: Once | INTRAVENOUS | Status: AC
  Administered 2015-07-28: 1000 mg via INTRAVENOUS
  Filled 2015-07-28: qty 100

## 2015-07-28 MED ORDER — MENTHOL 3 MG MT LOZG: 1.0000 | LOZENGE | OROMUCOSAL | Status: DC | PRN

## 2015-07-28 MED ORDER — SODIUM CHLORIDE 0.9 % IV SOLN: INTRAVENOUS | Status: DC

## 2015-07-28 MED ORDER — BUPIVACAINE HCL (PF) 0.25 % IJ SOLN
INTRAMUSCULAR | Status: AC
  Filled 2015-07-28: qty 30

## 2015-07-28 MED ORDER — BUPIVACAINE HCL (PF) 0.75 % IJ SOLN
INTRAMUSCULAR | Status: DC | PRN
  Administered 2015-07-28: 2 mL

## 2015-07-28 MED ORDER — METHOCARBAMOL 500 MG PO TABS
500.0000 mg | ORAL_TABLET | Freq: Four times a day (QID) | ORAL | Status: DC | PRN
  Administered 2015-07-28 – 2015-07-30 (×5): 500 mg via ORAL
  Filled 2015-07-28 (×5): qty 1

## 2015-07-28 SURGICAL SUPPLY — 59 items
BAG DECANTER FOR FLEXI CONT (MISCELLANEOUS) IMPLANT
BAG ZIPLOCK 12X15 (MISCELLANEOUS) ×2 IMPLANT
BANDAGE ELASTIC 6 VELCRO ST LF (GAUZE/BANDAGES/DRESSINGS) ×2 IMPLANT
BANDAGE ESMARK 6X9 LF (GAUZE/BANDAGES/DRESSINGS) ×1 IMPLANT
BLADE SAG 18X100X1.27 (BLADE) ×2 IMPLANT
BLADE SAW SGTL 11.0X1.19X90.0M (BLADE) ×2 IMPLANT
BNDG ESMARK 6X9 LF (GAUZE/BANDAGES/DRESSINGS) ×2
BOWL SMART MIX CTS (DISPOSABLE) ×2 IMPLANT
CAPT KNEE TOTAL 3 ATTUNE ×2 IMPLANT
CEMENT HV SMART SET (Cement) ×4 IMPLANT
CUFF TOURN SGL QUICK 34 (TOURNIQUET CUFF) ×1
CUFF TRNQT CYL 34X4X40X1 (TOURNIQUET CUFF) ×1 IMPLANT
DECANTER SPIKE VIAL GLASS SM (MISCELLANEOUS) ×2 IMPLANT
DRAPE EXTREMITY T 121X128X90 (DRAPE) ×2 IMPLANT
DRAPE POUCH INSTRU U-SHP 10X18 (DRAPES) ×2 IMPLANT
DRAPE U-SHAPE 47X51 STRL (DRAPES) ×2 IMPLANT
DRSG ADAPTIC 3X8 NADH LF (GAUZE/BANDAGES/DRESSINGS) ×2 IMPLANT
DRSG PAD ABDOMINAL 8X10 ST (GAUZE/BANDAGES/DRESSINGS) ×2 IMPLANT
DURAPREP 26ML APPLICATOR (WOUND CARE) ×2 IMPLANT
ELECT REM PT RETURN 9FT ADLT (ELECTROSURGICAL) ×2
ELECTRODE REM PT RTRN 9FT ADLT (ELECTROSURGICAL) ×1 IMPLANT
EVACUATOR 1/8 PVC DRAIN (DRAIN) ×2 IMPLANT
FACESHIELD WRAPAROUND (MASK) ×10 IMPLANT
GAUZE SPONGE 4X4 12PLY STRL (GAUZE/BANDAGES/DRESSINGS) ×2 IMPLANT
GLOVE BIO SURGEON STRL SZ7.5 (GLOVE) IMPLANT
GLOVE BIO SURGEON STRL SZ8 (GLOVE) ×2 IMPLANT
GLOVE BIOGEL PI IND STRL 6.5 (GLOVE) IMPLANT
GLOVE BIOGEL PI IND STRL 8 (GLOVE) ×1 IMPLANT
GLOVE BIOGEL PI INDICATOR 6.5 (GLOVE)
GLOVE BIOGEL PI INDICATOR 8 (GLOVE) ×1
GLOVE SURG SS PI 6.5 STRL IVOR (GLOVE) IMPLANT
GOWN STRL REUS W/TWL LRG LVL3 (GOWN DISPOSABLE) ×2 IMPLANT
GOWN STRL REUS W/TWL XL LVL3 (GOWN DISPOSABLE) IMPLANT
HANDPIECE INTERPULSE COAX TIP (DISPOSABLE) ×1
IMMOBILIZER KNEE 22 (SOFTGOODS) ×2 IMPLANT
KIT BASIN OR (CUSTOM PROCEDURE TRAY) ×2 IMPLANT
MANIFOLD NEPTUNE II (INSTRUMENTS) ×2 IMPLANT
NDL SAFETY ECLIPSE 18X1.5 (NEEDLE) ×2 IMPLANT
NEEDLE HYPO 18GX1.5 SHARP (NEEDLE) ×2
NS IRRIG 1000ML POUR BTL (IV SOLUTION) ×2 IMPLANT
PACK TOTAL JOINT (CUSTOM PROCEDURE TRAY) ×2 IMPLANT
PADDING CAST COTTON 6X4 STRL (CAST SUPPLIES) ×2 IMPLANT
PEN SKIN MARKING BROAD (MISCELLANEOUS) ×2 IMPLANT
POSITIONER SURGICAL ARM (MISCELLANEOUS) ×2 IMPLANT
SET HNDPC FAN SPRY TIP SCT (DISPOSABLE) ×1 IMPLANT
STRIP CLOSURE SKIN 1/2X4 (GAUZE/BANDAGES/DRESSINGS) ×2 IMPLANT
SUCTION FRAZIER 12FR DISP (SUCTIONS) ×2 IMPLANT
SUT MNCRL AB 4-0 PS2 18 (SUTURE) ×2 IMPLANT
SUT VIC AB 2-0 CT1 27 (SUTURE) ×3
SUT VIC AB 2-0 CT1 TAPERPNT 27 (SUTURE) ×3 IMPLANT
SUT VLOC 180 0 24IN GS25 (SUTURE) ×2 IMPLANT
SYR 20CC LL (SYRINGE) ×2 IMPLANT
SYR 50ML LL SCALE MARK (SYRINGE) ×2 IMPLANT
TOWEL OR 17X26 10 PK STRL BLUE (TOWEL DISPOSABLE) ×2 IMPLANT
TOWEL OR NON WOVEN STRL DISP B (DISPOSABLE) IMPLANT
TRAY FOLEY W/METER SILVER 16FR (SET/KITS/TRAYS/PACK) ×2 IMPLANT
WATER STERILE IRR 1500ML POUR (IV SOLUTION) ×2 IMPLANT
WRAP KNEE MAXI GEL POST OP (GAUZE/BANDAGES/DRESSINGS) ×2 IMPLANT
YANKAUER SUCT BULB TIP 10FT TU (MISCELLANEOUS) ×2 IMPLANT

## 2015-07-28 NOTE — Anesthesia Preprocedure Evaluation (Signed)
Anesthesia Evaluation  Patient identified by MRN, date of birth, ID band Patient awake    Reviewed: Allergy & Precautions, H&P , NPO status , Patient's Chart, lab work & pertinent test results  Airway Mallampati: II  TM Distance: >3 FB Neck ROM: full    Dental no notable dental hx. (+) Dental Advisory Given, Teeth Intact   Pulmonary neg pulmonary ROS, Current Smoker,  breath sounds clear to auscultation  Pulmonary exam normal       Cardiovascular Exercise Tolerance: Good hypertension, Pt. on medications Normal cardiovascular examRhythm:regular Rate:Normal     Neuro/Psych negative neurological ROS  negative psych ROS   GI/Hepatic negative GI ROS, Neg liver ROS,   Endo/Other  diabetes, Well Controlled, Type 2, Oral Hypoglycemic Agents  Renal/GU negative Renal ROS  negative genitourinary   Musculoskeletal   Abdominal   Peds  Hematology negative hematology ROS (+)   Anesthesia Other Findings   Reproductive/Obstetrics negative OB ROS                             Anesthesia Physical Anesthesia Plan  ASA: II  Anesthesia Plan: Spinal   Post-op Pain Management:    Induction:   Airway Management Planned: Simple Face Mask  Additional Equipment:   Intra-op Plan:   Post-operative Plan:   Informed Consent: I have reviewed the patients History and Physical, chart, labs and discussed the procedure including the risks, benefits and alternatives for the proposed anesthesia with the patient or authorized representative who has indicated his/her understanding and acceptance.   Dental Advisory Given  Plan Discussed with: CRNA and Surgeon  Anesthesia Plan Comments:         Anesthesia Quick Evaluation

## 2015-07-28 NOTE — Anesthesia Postprocedure Evaluation (Signed)
  Anesthesia Post-op Note  Patient: [name]  Procedure(s) Performed: Procedure(s) (LRB): LEFT TOTAL KNEE ARTHROPLASTY (Left)  Patient Location: PACU  Anesthesia Type: spinal  Level of Consciousness: awake and alert   Airway and Oxygen Therapy: Patient Spontanous Breathing  Post-op Pain: mild  Post-op Assessment: Post-op Vital signs reviewed, Patient's Cardiovascular Status Stable, Respiratory Function Stable, Patent Airway and No signs of Nausea or vomiting  Last Vitals:  Filed Vitals:   07/28/15 1723  BP: 117/69  Pulse: 68  Temp: 36.5 C  Resp: 14    Post-op Vital Signs: stable   Complications: No apparent anesthesia complications

## 2015-07-28 NOTE — Transfer of Care (Signed)
Immediate Anesthesia Transfer of Care Note  Patient: [name]  Procedure(s) Performed: Procedure(s): LEFT TOTAL KNEE ARTHROPLASTY (Left)  Patient Location: PACU  Anesthesia Type:SpinalT10  Level of Consciousness:  sedated, patient cooperative and responds to stimulation  Airway & Oxygen Therapy:Patient Spontanous Breathing and Patient connected to face mask oxgen  Post-op Assessment:  Report given to PACU RN and Post -op Vital signs reviewed and stable  Post vital signs:  Reviewed and stable  Last Vitals:  Filed Vitals:   07/28/15 1057  BP: 127/87  Pulse: 79  Temp: 36.7 C  Resp: 16    Complications: No apparent anesthesia complications

## 2015-07-28 NOTE — Op Note (Signed)
Pre-operative diagnosis- Osteoarthritis  Left knee(s)  Post-operative diagnosis- Osteoarthritis Left knee(s)  Procedure-  Left  Total Knee Arthroplasty  Surgeon- Gus Rankin. Dartanyan Deasis, MD  Assistant- Avel Peace, PA-C   Anesthesia-  Spinal  EBL-* No blood loss amount entered *   Drains Hemovac  Tourniquet time-  Total Tourniquet Time Documented: Thigh (Left) - 58 minutes Total: Thigh (Left) - 58 minutes     Complications- None  Condition-PACU - hemodynamically stable.   Brief Clinical Note   [name] is a 59 y.o. year old male with end stage OA of his left knee with progressively worsening pain and dysfunction. He has constant pain, with activity and at rest and significant functional deficits with difficulties even with ADLs. He has had extensive non-op management including analgesics, injections of cortisone, and home exercise program, but remains in significant pain with significant dysfunction. Radiographs show bone on bone arthritis medial and patellofemoral with large osteophytes.Marland Kitchen He presents now for left Total Knee Arthroplasty.     Procedure in detail---   The patient is brought into the operating room and positioned supine on the operating table. After successful administration of  Spinal,   a tourniquet is placed high on the  Left thigh(s) and the lower extremity is prepped and draped in the usual sterile fashion. Time out is performed by the operating team and then the  Left lower extremity is wrapped in Esmarch, knee flexed and the tourniquet inflated to 300 mmHg.       A midline incision is made with a ten blade through the subcutaneous tissue to the level of the extensor mechanism. A fresh blade is used to make a medial parapatellar arthrotomy. Soft tissue over the proximal medial tibia is subperiosteally elevated to the joint line with a knife and into the semimembranosus bursa with a Cobb elevator. Soft tissue over the proximal lateral tibia is elevated with attention  being paid to avoiding the patellar tendon on the tibial tubercle. The patella is everted, knee flexed 90 degrees and the ACL and PCL are removed. Findings are bone on bone all 3 compartments with massive global osteophytes.        The drill is used to create a starting hole in the distal femur and the canal is thoroughly irrigated with sterile saline to remove the fatty contents. The 5 degree Left  valgus alignment guide is placed into the femoral canal and the distal femoral cutting block is pinned to remove 10 mm off the distal femur. Resection is made with an oscillating saw.      The tibia is subluxed forward and the menisci are removed. The extramedullary alignment guide is placed referencing proximally at the medial aspect of the tibial tubercle and distally along the second metatarsal axis and tibial crest. The block is pinned to remove 2mm off the more deficient medial  side. Resection is made with an oscillating saw. Size 8is the most appropriate size for the tibia and the proximal tibia is prepared with the modular drill and keel punch for that size.      The femoral sizing guide is placed and size 9 is most appropriate. Rotation is marked off the epicondylar axis and confirmed by creating a rectangular flexion gap at 90 degrees. The size 9 cutting block is pinned in this rotation and the anterior, posterior and chamfer cuts are made with the oscillating saw. The intercondylar block is then placed and that cut is made.      Trial size 8  tibial component, trial size 9 posterior stabilized femur and a 8  mm posterior stabilized rotating platform insert trial is placed. Full extension is achieved with excellent varus/valgus and anterior/posterior balance throughout full range of motion. The patella is everted and thickness measured to be 31  mm. Free hand resection is taken to 18 mm, a 41 template is placed, lug holes are drilled, trial patella is placed, and it tracks normally. Osteophytes are removed  off the posterior femur with the trial in place. All trials are removed and the cut bone surfaces prepared with pulsatile lavage. Cement is mixed and once ready for implantation, the size 8 tibial implant, size  9 posterior stabilized femoral component, and the size 41 patella are cemented in place and the patella is held with the clamp. The trial insert is placed and the knee held in full extension. The Exparel (20 ml mixed with 30 ml saline) and .25% Bupivicaine, are injected into the extensor mechanism, posterior capsule, medial and lateral gutters and subcutaneous tissues.  All extruded cement is removed and once the cement is hard the permanent 8 mm posterior stabilized rotating platform insert is placed into the tibial tray.      The wound is copiously irrigated with saline solution and the extensor mechanism closed over a hemovac drain with #1 V-loc suture. The tourniquet is released for a total tourniquet time of 58  minutes. Flexion against gravity is 140 degrees and the patella tracks normally. Subcutaneous tissue is closed with 2.0 vicryl and subcuticular with running 4.0 Monocryl. The incision is cleaned and dried and steri-strips and a bulky sterile dressing are applied. The limb is placed into a knee immobilizer and the patient is awakened and transported to recovery in stable condition.      Please note that a surgical assistant was a medical necessity for this procedure in order to perform it in a safe and expeditious manner. Surgical assistant was necessary to retract the ligaments and vital neurovascular structures to prevent injury to them and also necessary for proper positioning of the limb to allow for anatomic placement of the prosthesis.   Gus Rankin Kajuan Guyton, MD    07/28/2015, 3:00 PM

## 2015-07-28 NOTE — H&P (View-Only) (Signed)
[name] DOB: 12/04/1956 Married / Language: English / Race: Black or African American Male Date of Admission:  07/28/2015 CC:  Left Knee Pain History of Present Illness The patient is a 59 year old male who comes in for a preoperative History and Physical. The patient is scheduled for a left total knee arthroplasty to be performed by Dr. Frank V. Aluisio, MD at [facility] on 07/28/2015. The patient is a 59 year old male who presented with knee complaints. The patient was seen for a second opinion. The patient reports left knee symptoms including: pain . Prior to being seen, the patient was previously evaluated by a colleague 5 year(s) ago (to 6). Previous work-up for this problem has included knee x-rays. Past treatment for this problem has included intra-articular injection of corticosteroids (and viscosupplementation (Supartz)). Note for "Knee pain": He has not had any surgery on the knee. He states that the left knee is getting progressively worse with time. His last injection was approximately 4 to 5 years ago. He has been treated in the past at [facility] by Dr. McKinley and Dr. Chandler. He states that his left knee has gotten progressively worse over time. It is now bothering him with all activities. He even has some discomfort at rest. It is definitely limiting what he can and cannot do. He has lost a lot of motion in the knee. He is at a stage, where he wants to discuss treatment options of the knee. He is not having any hip pain. He is not having lower extremity paresthesia. He has occasional pain in his right knee, but nowhere near as bad as the left. he was foudn to have osteoarthritis of the left knee and felt to benefit from undergoing a total knee replacement. They have been treated conservatively in the past for the above stated problem and despite conservative measures, they continue to have progressive pain and severe functional limitations and dysfunction. They have  failed non-operative management including home exercise, medications, and injections. It is felt that they would benefit from undergoing total joint replacement. Risks and benefits of the procedure have been discussed with the patient and they elect to proceed with surgery. There are no active contraindications to surgery such as ongoing infection or rapidly progressive neurological disease.  Problem List/Past Medical  Primary osteoarthritis of left knee (M17.12) Hypertension Hypercholesterolemia  Allergies ACE Inhibitors Swelling. Mouth  Social History Not under pain contract No history of drug/alcohol rehab Marital status married Tobacco use Former smoker. 04/03/2015: smoke(d) less than 1/2 pack(s) per day Number of flights of stairs before winded less than 1 Current work status working full time Exercise Exercises weekly; does other Living situation live with spouse Former drinker 04/03/2015: In the past drank beer less than 5 times per week  Medication History Multivitamin (Oral) Active. Ginkgo Biloba (Oral) Active. Glucosamine Complex (Oral) Active. Valsartan-Hydrochlorothiazide (160-12.5MG Tablet, Oral) Active. AmLODIPine Besylate (5MG Tablet, Oral) Active. MetFORMIN HCl ER (500MG Tablet ER 24HR, Oral) Active. Atorvastatin Calcium (20MG Tablet, Oral) Active. Saw Palmetto (1000MG Capsule, Oral) Active. Aspirin (81MG Tablet Chewable, Oral) Active.  Past Surgical History No pertinent past surgical history  Review of Systems General Not Present- Chills, Fatigue, Fever, Memory Loss, Night Sweats, Weight Gain and Weight Loss. Skin Not Present- Eczema, Hives, Itching, Lesions and Rash. HEENT Not Present- Dentures, Double Vision, Headache, Hearing Loss, Tinnitus and Visual Loss. Respiratory Not Present- Allergies, Chronic Cough, Coughing up blood, Shortness of breath at rest and Shortness of breath with   exertion. Cardiovascular Not Present- Chest Pain,  Difficulty Breathing Lying Down, Murmur, Palpitations, Racing/skipping heartbeats and Swelling. Gastrointestinal Not Present- Abdominal Pain, Bloody Stool, Constipation, Diarrhea, Difficulty Swallowing, Heartburn, Jaundice, Loss of appetitie, Nausea and Vomiting. Male Genitourinary Not Present- Blood in Urine, Discharge, Flank Pain, Incontinence, Painful Urination, Urgency, Urinary frequency, Urinary Retention, Urinating at Night and Weak urinary stream. Musculoskeletal Present- Joint Pain. Not Present- Back Pain, Joint Swelling, Morning Stiffness, Muscle Pain, Muscle Weakness and Spasms. Neurological Not Present- Blackout spells, Difficulty with balance, Dizziness, Paralysis, Tremor and Weakness. Psychiatric Not Present- Insomnia.  Vitals Weight: 270 lb Height: 75in Weight was reported by patient. Height was reported by patient. Body Surface Area: 2.49 m Body Mass Index: 33.75 kg/m  BP: 118/72 (Sitting, Right Arm, Standard)  Physical Exam General Mental Status -Alert, cooperative and good historian. General Appearance-pleasant, Not in acute distress. Orientation-Oriented X3. Build & Nutrition-Well nourished and Well developed.  Head and Neck Head-normocephalic, atraumatic . Neck Global Assessment - supple, no bruit auscultated on the right, no bruit auscultated on the left.  Eye Pupil - Bilateral-Regular and Round. Motion - Bilateral-EOMI.  Chest and Lung Exam Auscultation Breath sounds - clear at anterior chest wall and clear at posterior chest wall. Adventitious sounds - No Adventitious sounds.  Cardiovascular Auscultation Rhythm - Regular rate and rhythm. Heart Sounds - S1 WNL and S2 WNL. Murmurs & Other Heart Sounds - Auscultation of the heart reveals - No Murmurs.  Abdomen Palpation/Percussion Tenderness - Abdomen is non-tender to palpation. Rigidity (guarding) - Abdomen is soft. Auscultation Auscultation of the abdomen reveals - Bowel sounds  normal.  Male Genitourinary Note: Not done, not pertinent to present illness  Musculoskeletal Note: Well-developed male, alert and oriented, in no apparent distress. Evaluation of his hips shows normal range of motion with no discomfort. The right knee shows no effusion. Range of motion of the right knee is approximately 0 to 125 and 130. He has slight crepitus on range of motion, minimal tenderness, and no instability. Left knee, no effusion; a lot of hypertrophic changes around the left knee. His range is about 5 to 90. He cannot flex it past 90. There is no instability about the knee. He is tender medial greater than the lateral.  RADIOGRAPHS AP both knees and lateral shows severe medial and patellofemoral arthritis of the left knee with massive osteophyte formation in both the patellofemoral and medial compartments. He has minimal change in the right knee.  Assessment & Plan  Primary osteoarthritis of left knee (M17.12) Note:Surgical Plans: Left Total Knee Replacement  Disposition: Home  PCP: Dr. Ramachandran - Patient has been seen preoperatively and felt to be stable for surgery.  IV TXA  Anesthesia Issues: None  Signed electronically by Alexzandrew L Perkins, III PA-C 

## 2015-07-28 NOTE — Anesthesia Procedure Notes (Signed)
Spinal Patient location during procedure: OR Start time: 07/28/2015 2:30 PM Staffing Anesthesiologist: Ronelle Nigh Resident/CRNA: [place], Joud Ingwersen C Performed by: resident/CRNA  Preanesthetic Checklist Completed: patient identified, site marked, surgical consent, pre-op evaluation, timeout performed, IV checked, risks and benefits discussed and monitors and equipment checked Spinal Block Patient position: sitting Prep: Betadine and site prepped and draped Patient monitoring: heart rate, cardiac monitor, continuous pulse ox and blood pressure Approach: right paramedian Location: L3-4 Injection technique: single-shot Needle Needle type: Sprotte  Needle gauge: 24 G Needle length: 9 cm Assessment Sensory level: T6

## 2015-07-28 NOTE — Interval H&P Note (Signed)
History and Physical Interval Note:  07/28/2015 12:43 PM  Tim [name]  has presented today for surgery, with the diagnosis of osteoarthritis of the left knee  The various methods of treatment have been discussed with the patient and family. After consideration of risks, benefits and other options for treatment, the patient has consented to  Procedure(s): LEFT TOTAL KNEE ARTHROPLASTY (Left) as a surgical intervention .  The patient's history has been reviewed, patient examined, no change in status, stable for surgery.  I have reviewed the patient's chart and labs.  Questions were answered to the patient's satisfaction.     Loanne Drilling

## 2015-07-28 NOTE — Plan of Care (Signed)
Problem: Consults Goal: Diagnosis- Total Joint Replacement Outcome: Completed/Met Date Met:  07/28/15 Hemiarthroplasty  Problem: Phase I Progression Outcomes Goal: Initial discharge plan identified Outcome: Completed/Met Date Met:  07/28/15 Pt plans to go home with with and [facility] therapy.

## 2015-07-29 LAB — BASIC METABOLIC PANEL
ANION GAP: 6 (ref 5–15)
BUN: 14 mg/dL (ref 6–20)
CO2: 26 mmol/L (ref 22–32)
Calcium: 8.7 mg/dL — ABNORMAL LOW (ref 8.9–10.3)
Chloride: 104 mmol/L (ref 101–111)
Creatinine, Ser: 0.98 mg/dL (ref 0.61–1.24)
GFR calc Af Amer: >60 mL/min (ref >60)
Glucose, Bld: 162 mg/dL — ABNORMAL HIGH (ref 65–99)
Potassium: 4.3 mmol/L (ref 3.5–5.1)
Sodium: 136 mmol/L (ref 135–145)

## 2015-07-29 LAB — CBC
HCT: 36.7 % — ABNORMAL LOW (ref 39.0–52.0)
HEMOGLOBIN: 11.8 g/dL — AB (ref 13.0–17.0)
MCH: 24.4 pg — ABNORMAL LOW (ref 26.0–34.0)
MCHC: 32.2 g/dL (ref 30.0–36.0)
MCV: 75.8 fL — AB (ref 78.0–100.0)
Platelets: 210 10*3/uL (ref 150–400)
RBC: 4.84 MIL/uL (ref 4.22–5.81)
RDW: 13.4 % (ref 11.5–15.5)
WBC: 10.7 10*3/uL — ABNORMAL HIGH (ref 4.0–10.5)

## 2015-07-29 NOTE — Progress Notes (Signed)
Occupational Therapy Evaluation Patient Details Name: Tim [name] MRN: [id number] DOB: [birth year] Today's Date: 07/29/2015    History of Present Illness 59 yo male s/p L TKA 07/28/15   Clinical Impression   All OT education completed during evaluation and patient does not need further OT at this time. Will sign off.    Follow Up Recommendations  No OT follow up;Supervision - Intermittent    Equipment Recommendations  3 in 1 bedside comode    Recommendations for Other Services       Precautions / Restrictions Precautions Precautions: Fall Required Braces or Orthoses: Knee Immobilizer - Left Knee Immobilizer - Left: Discontinue once straight leg raise with < 10 degree lag Restrictions Weight Bearing Restrictions: No LLE Weight Bearing: Weight bearing as tolerated      Mobility Bed Mobility Overal bed mobility: Needs Assistance Bed Mobility: Supine to Sit     Supine to sit: Min guard     General bed mobility comments: close guard for safety.   Transfers Overall transfer level: Needs assistance Equipment used: Rolling walker (2 wheeled) Transfers: Sit to/from Stand Sit to Stand: Min guard;From elevated surface         General transfer comment: close guard for safety. VCs safety, hand placement    Balance                                            ADL Overall ADL's : Needs assistance/impaired Eating/Feeding: Independent   Grooming: Wash/dry hands;Wash/dry face;Oral care;Applying deodorant;Supervision/safety;Standing   Upper Body Bathing: Supervision/ safety;Standing   Lower Body Bathing: Supervison/ safety;Sit to/from stand   Upper Body Dressing : Supervision/safety;Standing   Lower Body Dressing: Supervision/safety;Sit to/from stand   Toilet Transfer: Supervision/safety;Ambulation;BSC;RW   Toileting- Clothing Manipulation and Hygiene: Supervision/safety;Sit to/from stand       Functional mobility during ADLs: Modified  independent;Rolling walker General ADL Comments: Patient up in recliner, no visitors present. Agreeable to OT evaluation. Patient ambulated from recliner to bathroom with RW, stood at sink to bathe, groom, and dress. Patient also performed toileting with 3 in 1 over toilet. Patient ambulated back to chair at end of session. Verbal education of shower transfer technique and patient verbalized understanding. Patient has no further OT needs. Will sign off.     Vision     Perception     Praxis      Pertinent Vitals/Pain Pain Assessment: 0-10 Pain Score: 3  Pain Location: L knee Pain Descriptors / Indicators: Sore Pain Intervention(s): Monitored during session     Hand Dominance Right   Extremity/Trunk Assessment Upper Extremity Assessment Upper Extremity Assessment: Overall WFL for tasks assessed   Lower Extremity Assessment Lower Extremity Assessment: Defer to PT evaluation LLE Deficits / Details: hip flex at least 2/5, hip abd/add 2/5, moves ankle well.  LLE: Unable to fully assess due to pain   Cervical / Trunk Assessment Cervical / Trunk Assessment: Normal   Communication Communication Communication: No difficulties   Cognition Arousal/Alertness: Awake/alert Behavior During Therapy: WFL for tasks assessed/performed Overall Cognitive Status: Within Functional Limits for tasks assessed                     General Comments       Exercises       Shoulder Instructions      Home Living Family/patient expects to be discharged to:: Private residence Living Arrangements: Spouse/significant  other   Type of Home: House Home Access: Stairs to enter [employer] of Steps: 2 Entrance Stairs-Rails: Right;Left Home Layout: One level     Bathroom Shower/Tub: Producer, television/film/video: Standard     Home Equipment: None          Prior Functioning/Environment Level of Independence: Independent             OT Diagnosis: Acute pain    OT Problem List: Pain;Decreased range of motion;Decreased knowledge of use of DME or AE   OT Treatment/Interventions:      OT Goals(Current goals can be found in the care plan section) Acute Rehab OT Goals Patient Stated Goal: to regain independence OT Goal Formulation: All assessment and education complete, DC therapy  OT Frequency:     Barriers to D/C:            Co-evaluation              End of Session CPM Left Knee CPM Left Knee: Off  Activity Tolerance:   Patient left:     Time: 1610-9604 OT Time Calculation (min): 29 min Charges:  OT General Charges $OT Visit: 1 Procedure OT Evaluation $Initial OT Evaluation Tier I: 1 Procedure OT Treatments $Self Care/Home Management : 8-22 mins G-Codes:    Tim [name] A 08-10-2015, 12:12 PM

## 2015-07-29 NOTE — Progress Notes (Signed)
Physical Therapy Treatment Patient Details Name: Tim [name] MRN: [id number] DOB: 03-Apr-1956 Today's Date: 07/29/2015    History of Present Illness 59 yo male s/p L TKA 07/28/15    PT Comments    Progressing with mobility.   Follow Up Recommendations  Home health PT     Equipment Recommendations  Rolling walker with 5" wheels    Recommendations for Other Services OT consult     Precautions / Restrictions Precautions Precautions: Fall Required Braces or Orthoses: Knee Immobilizer - Left Knee Immobilizer - Left: Discontinue once straight leg raise with < 10 degree lag Restrictions Weight Bearing Restrictions: No LLE Weight Bearing: Weight bearing as tolerated    Mobility  Bed Mobility Overal bed mobility: Needs Assistance Bed Mobility: Supine to Sit;Sit to Supine     Supine to sit: Min guard Sit to supine: Min assist   General bed mobility comments: small amount of assist for LE onto bed  Transfers Overall transfer level: Needs assistance Equipment used: Rolling walker (2 wheeled) Transfers: Sit to/from Stand Sit to Stand: Min guard;From elevated surface         General transfer comment: close guard for safety. VCs safety, hand placement  Ambulation/Gait Ambulation/Gait assistance: Min guard Ambulation Distance (Feet): 135 Feet Assistive device: Rolling walker (2 wheeled) Gait Pattern/deviations: Step-to pattern;Antalgic;Decreased stance time - left     General Gait Details: close guard for safety. VCS safety, sequence. Increased knee flexion noted during stance.   Stairs            Wheelchair Mobility    Modified Rankin (Stroke Patients Only)       Balance                                    Cognition Arousal/Alertness: Awake/alert Behavior During Therapy: WFL for tasks assessed/performed Overall Cognitive Status: Within Functional Limits for tasks assessed                      Exercises      General Comments         Pertinent Vitals/Pain Pain Assessment: 0-10 Pain Score: 7  Pain Location: L knee Pain Descriptors / Indicators: Sore Pain Intervention(s): Monitored during session;Repositioned    Home Living Family/patient expects to be discharged to:: Private residence Living Arrangements: Spouse/significant other   Type of Home: House Home Access: Stairs to enter Entrance Stairs-Rails: Right;Left Home Layout: One level Home Equipment: None      Prior Function Level of Independence: Independent          PT Goals (current goals can now be found in the care plan section) Progress towards PT goals: Progressing toward goals    Frequency  7X/week    PT Plan Current plan remains appropriate    Co-evaluation             End of Session Equipment Utilized During Treatment: Gait belt Activity Tolerance: Patient tolerated treatment well Patient left: in bed;with call bell/phone within reach;with family/visitor present     Time: 1451-1506 PT Time Calculation (min) (ACUTE ONLY): 15 min  Charges:  $Gait Training: 8-22 mins                    G Codes:      Rebeca Alert, MPT Pager: [phone]

## 2015-07-29 NOTE — Care Management Note (Signed)
Case Management Note  Patient Details  Name: Tim [name] MRN: [id number] Date of Birth: 11-03-1956  Subjective/Objective:                   LEFT TOTAL KNEE ARTHROPLASTY (Left) Action/Plan:  Discharge planning Expected Discharge Date:  07/30/15               Expected Discharge Plan:  [place]  In-House Referral:     Discharge planning Services  CM Consult  Post Acute Care Choice:  Home Health Choice offered to:     DME Arranged:  3-N-1, Walker rolling DME Agency:  [place]:  PT [facility] Agency:  [place]  Status of Service:  Completed, signed off  Medicare Important Message Given:    Date Medicare IM Given:    Medicare IM give by:    Date Additional Medicare IM Given:    Additional Medicare Important Message give by:     If discussed at [place] of Stay Meetings, dates discussed:    Additional Comments: CM met with pt in room to offer choice of home health agency.  Pt chooses [facility] to render HHPT.  Address and contact information verified by pt.  Referral given to [facility] rep, Tim (on unit).  CM called [facility] DME rep, Lecretia to please deliver the 3n1 and rolling walker to room prior to discharge.  NO other CM needs were communicated.   Dellie Catholic, RN 07/29/2015, 10:55 AM

## 2015-07-29 NOTE — Evaluation (Signed)
Physical Therapy Evaluation Patient Details Name: Tim [name] MRN: [id number] DOB: 12-02-1956 Today's Date: 07/29/2015   History of Present Illness  59 yo male s/p L TKA 07/28/15  Clinical Impression  On eval, pt was Min guard assist for mobility-walked ~100 feet with RW. Pain rated 5/10.     Follow Up Recommendations Home health PT    Equipment Recommendations  Rolling walker with 5" wheels    Recommendations for Other Services OT consult     Precautions / Restrictions Precautions Precautions: Fall Required Braces or Orthoses: Knee Immobilizer - Left Knee Immobilizer - Left: Discontinue once straight leg raise with < 10 degree lag Restrictions Weight Bearing Restrictions: No LLE Weight Bearing: Weight bearing as tolerated      Mobility  Bed Mobility Overal bed mobility: Needs Assistance Bed Mobility: Supine to Sit     Supine to sit: Min guard     General bed mobility comments: close guard for safety.   Transfers Overall transfer level: Needs assistance Equipment used: Rolling walker (2 wheeled) Transfers: Sit to/from Stand Sit to Stand: Min guard;From elevated surface         General transfer comment: close guard for safety. VCs safety, hand placement  Ambulation/Gait Ambulation/Gait assistance: Min guard Ambulation Distance (Feet): 100 Feet Assistive device: Rolling walker (2 wheeled) Gait Pattern/deviations: Step-to pattern;Antalgic     General Gait Details: close guard for safety. VCS safety, sequence.   Stairs            Wheelchair Mobility    Modified Rankin (Stroke Patients Only)       Balance                                             Pertinent Vitals/Pain Pain Assessment: 0-10 Pain Score: 5  Pain Location: L knee Pain Descriptors / Indicators: Sore Pain Intervention(s): Monitored during session;Ice applied;Repositioned    Home Living Family/patient expects to be discharged to:: Private residence Living  Arrangements: Spouse/significant other   Type of Home: House Home Access: Stairs to enter Entrance Stairs-Rails: Doctor, general practice of Steps: 2 Home Layout: One level Home Equipment: None      Prior Function Level of Independence: Independent               Hand Dominance        Extremity/Trunk Assessment   Upper Extremity Assessment: Defer to OT evaluation           Lower Extremity Assessment: LLE deficits/detail   LLE Deficits / Details: hip flex at least 2/5, hip abd/add 2/5, moves ankle well.   Cervical / Trunk Assessment: Normal  Communication   Communication: No difficulties  Cognition Arousal/Alertness: Awake/alert Behavior During Therapy: WFL for tasks assessed/performed Overall Cognitive Status: Within Functional Limits for tasks assessed                      General Comments      Exercises Total Joint Exercises Ankle Circles/Pumps: AROM;Both;10 reps;Supine Quad Sets: AROM;Both;10 reps;Supine Heel Slides: AAROM;Left;10 reps;Supine Hip ABduction/ADduction: AAROM;Left;10 reps;Supine Straight Leg Raises: AAROM;Left;10 reps;Supine Goniometric ROM: ~10-55 degrees      Assessment/Plan    PT Assessment Patient needs continued PT services  PT Diagnosis Difficulty walking;Acute pain   PT Problem List Decreased strength;Decreased range of motion;Decreased activity tolerance;Decreased balance;Decreased mobility;Pain;Decreased knowledge of use of DME  PT Treatment Interventions DME instruction;Gait  training;Stair training;Functional mobility training;Therapeutic activities;Patient/family education;Balance training;Therapeutic exercise   PT Goals (Current goals can be found in the Care Plan section) Acute Rehab PT Goals Patient Stated Goal: to regain independence PT Goal Formulation: With patient Time For Goal Achievement: 08/05/15 Potential to Achieve Goals: Good    Frequency 7X/week   Barriers to discharge         Co-evaluation               End of Session Equipment Utilized During Treatment: Gait belt;Left knee immobilizer Activity Tolerance: Patient tolerated treatment well Patient left: in chair;with call bell/phone within reach           Time: 0957-1013 PT Time Calculation (min) (ACUTE ONLY): 16 min   Charges:   PT Evaluation $Initial PT Evaluation Tier I: 1 Procedure     PT G Codes:        Rebeca Alert, MPT Pager: [phone]

## 2015-07-29 NOTE — Progress Notes (Signed)
Utilization review completed.  

## 2015-07-29 NOTE — Discharge Instructions (Addendum)
° °Dr. Frank Aluisio °Total Joint Specialist °[facility] °[address] °[place], [place] [address] °[phone] ° °TOTAL KNEE REPLACEMENT POSTOPERATIVE DIRECTIONS ° °Knee Rehabilitation, Guidelines Following Surgery  °Results after knee surgery are often greatly improved when you follow the exercise, range of motion and muscle strengthening exercises prescribed by your doctor. Safety measures are also important to protect the knee from further injury. Any time any of these exercises cause you to have increased pain or swelling in your knee joint, decrease the amount until you are comfortable again and slowly increase them. If you have problems or questions, call your caregiver or physical therapist for advice.  ° °HOME CARE INSTRUCTIONS  °Remove items at home which could result in a fall. This includes throw rugs or furniture in walking pathways.  °· ICE to the affected knee every three hours for 30 minutes at a time and then as needed for pain and swelling.  Continue to use ice on the knee for pain and swelling from surgery. You may notice swelling that will progress down to the foot and ankle.  This is normal after surgery.  Elevate the leg when you are not up walking on it.   °· Continue to use the breathing machine which will help keep your temperature down.  It is common for your temperature to cycle up and down following surgery, especially at night when you are not up moving around and exerting yourself.  The breathing machine keeps your lungs expanded and your temperature down. °· Do not place pillow under knee, focus on keeping the knee straight while resting ° °DIET °You may resume your previous home diet once your are discharged from the hospital. ° °DRESSING / WOUND CARE / SHOWERING °You may shower 3 days after surgery, but keep the wounds dry during showering.  You may use an occlusive plastic wrap (Press'n Seal for example), NO SOAKING/SUBMERGING IN THE BATHTUB.  If the  bandage gets wet, change with a clean dry gauze.  If the incision gets wet, pat the wound dry with a clean towel. °You may start showering once you are discharged home but do not submerge the incision under water. Just pat the incision dry and apply a dry gauze dressing on daily. °Change the surgical dressing daily and reapply a dry dressing each time. ° °ACTIVITY °Walk with your walker as instructed. °Use walker as long as suggested by your caregivers. °Avoid periods of inactivity such as sitting longer than an hour when not asleep. This helps prevent blood clots.  °You may resume a sexual relationship in one month or when given the OK by your doctor.  °You may return to work once you are cleared by your doctor.  °Do not drive a car for 6 weeks or until released by you surgeon.  °Do not drive while taking narcotics. ° °WEIGHT BEARING °Weight bearing as tolerated with assist device (walker, cane, etc) as directed, use it as long as suggested by your surgeon or therapist, typically at least 4-6 weeks. ° °POSTOPERATIVE CONSTIPATION PROTOCOL °Constipation - defined medically as fewer than three stools per week and severe constipation as less than one stool per week. ° °One of the most common issues patients have following surgery is constipation.  Even if you have a regular bowel pattern at home, your normal regimen is likely to be disrupted due to multiple reasons following surgery.  Combination of anesthesia, postoperative narcotics, change in appetite and fluid intake all can affect your bowels.    In order to avoid complications following surgery, here are some recommendations in order to help you during your recovery period. ° °Colace (docusate) - Pick up an over-the-counter form of Colace or another stool softener and take twice a day as long as you are requiring postoperative pain medications.  Take with a full glass of water daily.  If you experience loose stools or diarrhea, hold the colace until you stool forms  back up.  If your symptoms do not get better within 1 week or if they get worse, check with your doctor. ° °Dulcolax (bisacodyl) - Pick up over-the-counter and take as directed by the product packaging as needed to assist with the movement of your bowels.  Take with a full glass of water.  Use this product as needed if not relieved by Colace only.  ° °MiraLax (polyethylene glycol) - Pick up over-the-counter to have on hand.  MiraLax is a solution that will increase the amount of water in your bowels to assist with bowel movements.  Take as directed and can mix with a glass of water, juice, soda, coffee, or tea.  Take if you go more than two days without a movement. °Do not use MiraLax more than once per day. Call your doctor if you are still constipated or irregular after using this medication for 7 days in a row. ° °If you continue to have problems with postoperative constipation, please contact the office for further assistance and recommendations.  If you experience "the worst abdominal pain ever" or develop nausea or vomiting, please contact the office immediatly for further recommendations for treatment. ° °ITCHING ° If you experience itching with your medications, try taking only a single pain pill, or even half a pain pill at a time.  You can also use Benadryl over the counter for itching or also to help with sleep.  ° °TED HOSE STOCKINGS °Wear the elastic stockings on both legs for three weeks following surgery during the day but you may remove then at night for sleeping. ° °MEDICATIONS °See your medication summary on the “After Visit Summary” that the nursing staff will review with you prior to discharge.  You may have some home medications which will be placed on hold until you complete the course of blood thinner medication.  It is important for you to complete the blood thinner medication as prescribed by your surgeon.  Continue your approved medications as instructed at time of  discharge. ° °PRECAUTIONS °If you experience chest pain or shortness of breath - call 911 immediately for transfer to the hospital emergency department.  °If you develop a fever greater that 101 F, purulent drainage from wound, increased redness or drainage from wound, foul odor from the wound/dressing, or calf pain - CONTACT YOUR SURGEON.   °                                                °FOLLOW-UP APPOINTMENTS °Make sure you keep all of your appointments after your operation with your surgeon and caregivers. You should call the office at the above phone number and make an appointment for approximately two weeks after the date of your surgery or on the date instructed by your surgeon outlined in the "After Visit Summary". ° ° °RANGE OF MOTION AND STRENGTHENING EXERCISES  °Rehabilitation of the knee is important following a knee injury or   an operation. After just a few days of immobilization, the muscles of the thigh which control the knee become weakened and shrink (atrophy). Knee exercises are designed to build up the tone and strength of the thigh muscles and to improve knee motion. Often times heat used for twenty to thirty minutes before working out will loosen up your tissues and help with improving the range of motion but do not use heat for the first two weeks following surgery. These exercises can be done on a training (exercise) mat, on the floor, on a table or on a bed. Use what ever works the best and is most comfortable for you Knee exercises include:  °Leg Lifts - While your knee is still immobilized in a splint or cast, you can do straight leg raises. Lift the leg to 60 degrees, hold for 3 sec, and slowly lower the leg. Repeat 10-20 times 2-3 times daily. Perform this exercise against resistance later as your knee gets better.  °Quad and Hamstring Sets - Tighten up the muscle on the front of the thigh (Quad) and hold for 5-10 sec. Repeat this 10-20 times hourly. Hamstring sets are done by pushing the  foot backward against an object and holding for 5-10 sec. Repeat as with quad sets.  °· Leg Slides: Lying on your back, slowly slide your foot toward your buttocks, bending your knee up off the floor (only go as far as is comfortable). Then slowly slide your foot back down until your leg is flat on the floor again. °· Angel Wings: Lying on your back spread your legs to the side as far apart as you can without causing discomfort.  °A rehabilitation program following serious knee injuries can speed recovery and prevent re-injury in the future due to weakened muscles. Contact your doctor or a physical therapist for more information on knee rehabilitation.  ° °IF YOU ARE TRANSFERRED TO A SKILLED REHAB FACILITY °If the patient is transferred to a skilled rehab facility following release from the hospital, a list of the current medications will be sent to the facility for the patient to continue.  When discharged from the skilled rehab facility, please have the facility set up the patient's Home Health Physical Therapy prior to being released. Also, the skilled facility will be responsible for providing the patient with their medications at time of release from the facility to include their pain medication, the muscle relaxants, and their blood thinner medication. If the patient is still at the rehab facility at time of the two week follow up appointment, the skilled rehab facility will also need to assist the patient in arranging follow up appointment in our office and any transportation needs. ° °MAKE SURE YOU:  °Understand these instructions.  °Get help right away if you are not doing well or get worse.  ° ° °Pick up stool softner and laxative for home use following surgery while on pain medications. °Do not submerge incision under water. °Please use good hand washing techniques while changing dressing each day. °May shower starting three days after surgery. °Please use a clean towel to pat the incision dry following  showers. °Continue to use ice for pain and swelling after surgery. °Do not use any lotions or creams on the incision until instructed by your surgeon. ° °Take Xarelto for two and a half more weeks, then discontinue Xarelto. °Once the patient has completed the Xarelto, they may resume the 81 mg Aspirin. ° ° °Information on my medicine - XARELTO® (Rivaroxaban) ° °  This medication education was reviewed with me or my healthcare representative as part of my discharge preparation.   ° °Why was Xarelto® prescribed for you? °Xarelto® was prescribed for you to reduce the risk of blood clots forming after orthopedic surgery. The medical term for these abnormal blood clots is venous thromboembolism (VTE). ° °What do you need to know about xarelto® ? °Take your Xarelto® ONCE DAILY at the same time every day. °You may take it either with or without food. ° °If you have difficulty swallowing the tablet whole, you may crush it and mix in applesauce just prior to taking your dose. ° °Take Xarelto® exactly as prescribed by your doctor and DO NOT stop taking Xarelto® without talking to the doctor who prescribed the medication.  Stopping without other VTE prevention medication to take the place of Xarelto® may increase your risk of developing a clot. ° °After discharge, you should have regular check-up appointments with your healthcare provider that is prescribing your Xarelto®.   ° °What do you do if you miss a dose? °If you miss a dose, take it as soon as you remember on the same day then continue your regularly scheduled once daily regimen the next day. Do not take two doses of Xarelto® on the same day.  ° °Important Safety Information °A possible side effect of Xarelto® is bleeding. You should call your healthcare provider right away if you experience any of the following: °? Bleeding from an injury or your nose that does not stop. °? Unusual colored urine (red or dark brown) or unusual colored stools (red or black). °? Unusual  bruising for unknown reasons. °? A serious fall or if you hit your head (even if there is no bleeding). ° °Some medicines may interact with Xarelto® and might increase your risk of bleeding while on Xarelto®. To help avoid this, consult your healthcare provider or pharmacist prior to using any new prescription or non-prescription medications, including herbals, vitamins, non-steroidal anti-inflammatory drugs (NSAIDs) and supplements. ° °This website has more information on Xarelto®: www.xarelto.com. ° ° °

## 2015-07-30 LAB — BASIC METABOLIC PANEL
Anion gap: 10 (ref 5–15)
BUN: 13 mg/dL (ref 6–20)
CHLORIDE: 102 mmol/L (ref 101–111)
CO2: 27 mmol/L (ref 22–32)
Calcium: 8.9 mg/dL (ref 8.9–10.3)
Creatinine, Ser: 0.93 mg/dL (ref 0.61–1.24)
GFR calc non Af Amer: >60 mL/min (ref >60)
GLUCOSE: 125 mg/dL — AB (ref 65–99)
POTASSIUM: 3.8 mmol/L (ref 3.5–5.1)
Sodium: 139 mmol/L (ref 135–145)

## 2015-07-30 LAB — CBC
HCT: 32.1 % — ABNORMAL LOW (ref 39.0–52.0)
Hemoglobin: 10.1 g/dL — ABNORMAL LOW (ref 13.0–17.0)
MCH: 23.8 pg — ABNORMAL LOW (ref 26.0–34.0)
MCHC: 31.5 g/dL (ref 30.0–36.0)
MCV: 75.5 fL — AB (ref 78.0–100.0)
Platelets: 188 10*3/uL (ref 150–400)
RBC: 4.25 MIL/uL (ref 4.22–5.81)
RDW: 13.7 % (ref 11.5–15.5)
WBC: 11.4 10*3/uL — ABNORMAL HIGH (ref 4.0–10.5)

## 2015-07-30 MED ORDER — METHOCARBAMOL 500 MG PO TABS: 500.0000 mg | ORAL_TABLET | Freq: Four times a day (QID) | ORAL | Status: DC | PRN

## 2015-07-30 MED ORDER — OXYCODONE HCL 5 MG PO TABS: 5.0000 mg | ORAL_TABLET | ORAL | Status: DC | PRN

## 2015-07-30 MED ORDER — RIVAROXABAN 10 MG PO TABS: 10.0000 mg | ORAL_TABLET | Freq: Every day | ORAL | Status: DC

## 2015-07-30 MED ORDER — TRAMADOL HCL 50 MG PO TABS: 50.0000 mg | ORAL_TABLET | Freq: Four times a day (QID) | ORAL | Status: DC | PRN

## 2015-07-30 NOTE — Progress Notes (Signed)
LATE ENTRY NOTE Date of Service of Visit - 07/29/2015    Subjective: 2 Days Post-Op Procedure(s) (LRB): LEFT TOTAL KNEE ARTHROPLASTY (Left) Patient reports pain as mild.   Patient seen in rounds with Dr. Lequita Halt. Patient is well, but has had some minor complaints of pain in the knee, requiring pain medications We will start therapy today.  Plan is to go Home after hospital stay.  Objective: Vital signs in last 24 hours: Temp:  [98.2 F (36.8 C)-99.6 F (37.6 C)] 99.6 F (37.6 C) (08/10 0534) Pulse Rate:  [90-103] 90 (08/10 0534) Resp:  [18] 18 (08/10 0534) BP: (128-150)/(57-78) 139/78 mmHg (08/10 0534) SpO2:  [100 %] 100 % (08/10 0534)  Intake/Output from previous day:  Intake/Output Summary (Last 24 hours) at 07/30/15 0922 Last data filed at 07/30/15 0535  Gross per 24 hour  Intake    600 ml  Output   2775 ml  Net  -2175 ml    Labs:  Recent Labs  07/29/15 0526   HGB 11.8*     Recent Labs  07/29/15 0526   WBC 10.7*   RBC 4.84   HCT 36.7*   PLT 210     Recent Labs  07/29/15 0526   NA 136   K 4.3   CL 104   CO2 26   BUN 14   CREATININE 0.98   GLUCOSE 162*   CALCIUM 8.7*    No results for input(s): LABPT, INR in the last 72 hours.  EXAM General - Patient is Alert, Appropriate and Oriented Extremity - Neurovascular intact Sensation intact distally Dorsiflexion/Plantar flexion intact Dressing - dressing C/D/I Motor Function - intact, moving foot and toes well on exam.  Hemovac pulled without difficulty.  Past Medical History  Diagnosis Date  . Hypertension   . Arthritis     Assessment/Plan: 2 Days Post-Op Procedure(s) (LRB): LEFT TOTAL KNEE ARTHROPLASTY (Left) Principal Problem:   OA (osteoarthritis) of knee  Estimated body mass index is 34.37 kg/(m^2) as calculated from the following:   Height as of this encounter:  (1.905 m).   Weight as of this encounter: 124.739 kg (275 lb). Advance diet Up with therapy Plan for discharge  tomorrow Discharge home with home health  DVT Prophylaxis - Xarelto Weight-Bearing as tolerated to left leg D/C O2 and Pulse OX and try on Room Air  Avel Peace, PA-C Orthopaedic Surgery 07/30/2015, 9:22 AM

## 2015-07-30 NOTE — Progress Notes (Signed)
Physical Therapy Treatment Patient Details Name: Tim [name] MRN: [id number] DOB: 02-12-1956 Today's Date: 07/30/2015    History of Present Illness 59 yo male s/p L TKA 07/28/15    PT Comments    Progressing with mobility. Pain rated 5/10. Practiced ambulation, stair negotiation, exercises. Ready to d/c from PT standpoint.   Follow Up Recommendations  Home health PT     Equipment Recommendations  Rolling walker with 5" wheels    Recommendations for Other Services       Precautions / Restrictions Precautions Precautions: Fall Required Braces or Orthoses: Knee Immobilizer - Left Knee Immobilizer - Left: Discontinue once straight leg raise with < 10 degree lag Restrictions Weight Bearing Restrictions: No LLE Weight Bearing: Weight bearing as tolerated    Mobility  Bed Mobility Overal bed mobility: Needs Assistance Bed Mobility: Supine to Sit     Supine to sit: Supervision     General bed mobility comments: for safety  Transfers Overall transfer level: Needs assistance Equipment used: Rolling walker (2 wheeled) Transfers: Sit to/from Stand Sit to Stand: Min guard         General transfer comment: close guard for safety. VCs safety, hand placement  Ambulation/Gait Ambulation/Gait assistance: Min guard Ambulation Distance (Feet): 100 Feet Assistive device: Rolling walker (2 wheeled) Gait Pattern/deviations: Step-to pattern;Decreased stance time - left;Antalgic     General Gait Details: close guard for safety. VCS safety, sequence. Increased knee flexion noted during stance.   Stairs Stairs: Yes   Stair Management: Forwards;Step to pattern;Two rails Number of Stairs: 2 General stair comments: VCs safety, technique, sequence. Assist to stabilize.   Wheelchair Mobility    Modified Rankin (Stroke Patients Only)       Balance                                    Cognition Arousal/Alertness: Awake/alert Behavior During Therapy: WFL for  tasks assessed/performed Overall Cognitive Status: Within Functional Limits for tasks assessed                      Exercises Total Joint Exercises Ankle Circles/Pumps: AROM;Both;10 reps;Supine Quad Sets: AROM;Both;10 reps;Supine Hip ABduction/ADduction: AAROM;Left;10 reps;Supine Straight Leg Raises: AAROM;Left;10 reps;Supine Knee Flexion: AAROM;Left;5 reps;Seated Goniometric ROM: ~10-60 degrees    General Comments        Pertinent Vitals/Pain Pain Assessment: 0-10 Pain Score: 5  Pain Location: L knee Pain Descriptors / Indicators: Sore Pain Intervention(s): Monitored during session;Ice applied;Repositioned    Home Living                      Prior Function            PT Goals (current goals can now be found in the care plan section) Progress towards PT goals: Progressing toward goals    Frequency  7X/week    PT Plan Current plan remains appropriate    Co-evaluation             End of Session Equipment Utilized During Treatment: Gait belt Activity Tolerance: Patient tolerated treatment well Patient left: in chair;with call bell/phone within reach     Time: 4098-1191 PT Time Calculation (min) (ACUTE ONLY): 19 min  Charges:  $Gait Training: 8-22 mins                    G Codes:      Rebeca Alert, MPT Pager:  [phone]   

## 2015-07-30 NOTE — Progress Notes (Signed)
   Subjective: 2 Days Post-Op Procedure(s) (LRB): LEFT TOTAL KNEE ARTHROPLASTY (Left) Patient reports pain as mild.   Patient seen in rounds by Dr. Lequita Halt. Patient is well, and has had no acute complaints or problems Patient is ready to go home  Objective: Vital signs in last 24 hours: Temp:  [98.2 F (36.8 C)-99.6 F (37.6 C)] 99.6 F (37.6 C) (08/10 0534) Pulse Rate:  [90-103] 90 (08/10 0534) Resp:  [18] 18 (08/10 0534) BP: (128-150)/(57-78) 139/78 mmHg (08/10 0534) SpO2:  [100 %] 100 % (08/10 0534)  Intake/Output from previous day:  Intake/Output Summary (Last 24 hours) at 07/30/15 0924 Last data filed at 07/30/15 0535  Gross per 24 hour  Intake    600 ml  Output   2775 ml  Net  -2175 ml    Labs:  Recent Labs  07/29/15 0526 07/30/15 0514  HGB 11.8* 10.1*    Recent Labs  07/29/15 0526 07/30/15 0514  WBC 10.7* 11.4*  RBC 4.84 4.25  HCT 36.7* 32.1*  PLT 210 188    Recent Labs  07/29/15 0526 07/30/15 0514  NA 136 139  K 4.3 3.8  CL 104 102  CO2 26 27  BUN 14 13  CREATININE 0.98 0.93  GLUCOSE 162* 125*  CALCIUM 8.7* 8.9   No results for input(s): LABPT, INR in the last 72 hours.  EXAM: General - Patient is Alert, Appropriate and Oriented Extremity - Neurovascular intact Sensation intact distally Dorsiflexion/Plantar flexion intact Incision - clean, dry, no drainage Motor Function - intact, moving foot and toes well on exam.   Assessment/Plan: 2 Days Post-Op Procedure(s) (LRB): LEFT TOTAL KNEE ARTHROPLASTY (Left) Procedure(s) (LRB): LEFT TOTAL KNEE ARTHROPLASTY (Left) Past Medical History  Diagnosis Date  . Hypertension   . Arthritis    Principal Problem:   OA (osteoarthritis) of knee  Estimated body mass index is 34.37 kg/(m^2) as calculated from the following:   Height as of this encounter:  (1.905 m).   Weight as of this encounter: 124.739 kg (275 lb). Up with therapy Discharge home with home health Diet - Cardiac  diet Follow up - in 2 weeks Activity - WBAT Disposition - Home Condition Upon Discharge - Good D/C Meds - See DC Summary DVT Prophylaxis - Xarelto  Avel Peace, PA-C Orthopaedic Surgery 07/30/2015, 9:24 AM

## 2015-07-30 NOTE — Discharge Summary (Signed)
Physician Discharge Summary   Patient ID: Tim [name] MRN: [id number] DOB/AGE: 04/03/1956 59 y.o.  Admit date: 07/28/2015 Discharge date: 07/30/2015  Primary Diagnosis:  Osteoarthritis Left knee(s) Admission Diagnoses:  Past Medical History  Diagnosis Date  . Hypertension   . Arthritis    Discharge Diagnoses:   Principal Problem:   OA (osteoarthritis) of knee  Estimated body mass index is 34.37 kg/(m^2) as calculated from the following:   Height as of this encounter: $RemoveBeforeD'6\' 3"'[id number]$  (1.905 m).   Weight as of this encounter: 124.739 kg (275 lb).  Procedure:  Procedure(s) (LRB): LEFT TOTAL KNEE ARTHROPLASTY (Left)   Consults: None  HPI: Tim [name] is a 59 y.o. year old male with end stage OA of his left knee with progressively worsening pain and dysfunction. He has constant pain, with activity and at rest and significant functional deficits with difficulties even with ADLs. He has had extensive non-op management including analgesics, injections of cortisone, and home exercise program, but remains in significant pain with significant dysfunction. Radiographs show bone on bone arthritis medial and patellofemoral with large osteophytes.Marland Kitchen He presents now for left Total Knee Arthroplasty.   Laboratory Data: Admission on 07/28/2015  Component Date Value Ref Range Status  . Glucose-Capillary 07/28/2015 103* 65 - 99 mg/dL Final  . Glucose-Capillary 07/28/2015 88  65 - 99 mg/dL Final  . WBC 07/29/2015 10.7* 4.0 - 10.5 K/uL Final  . RBC 07/29/2015 4.84  4.22 - 5.81 MIL/uL Final  . Hemoglobin 07/29/2015 11.8* 13.0 - 17.0 g/dL Final  . HCT 07/29/2015 36.7* 39.0 - 52.0 % Final  . MCV 07/29/2015 75.8* 78.0 - 100.0 fL Final  . MCH 07/29/2015 24.4* 26.0 - 34.0 pg Final  . MCHC 07/29/2015 32.2  30.0 - 36.0 g/dL Final  . RDW 07/29/2015 13.4  11.5 - 15.5 % Final  . Platelets 07/29/2015 210  150 - 400 K/uL Final  . Sodium 07/29/2015 136  135 - 145 mmol/L Final  . Potassium 07/29/2015 4.3  3.5 - 5.1  mmol/L Final  . Chloride 07/29/2015 104  101 - 111 mmol/L Final  . CO2 07/29/2015 26  22 - 32 mmol/L Final  . Glucose, Bld 07/29/2015 162* 65 - 99 mg/dL Final  . BUN 07/29/2015 14  6 - 20 mg/dL Final  . Creatinine, Ser 07/29/2015 0.98  0.61 - 1.24 mg/dL Final  . Calcium 07/29/2015 8.7* 8.9 - 10.3 mg/dL Final  . GFR calc non Af Amer 07/29/2015 >60  >60 mL/min Final  . GFR calc Af Amer 07/29/2015 >60  >60 mL/min Final   Comment: (NOTE) The eGFR has been calculated using the CKD EPI equation. This calculation has not been validated in all clinical situations. eGFR's persistently <60 mL/min signify possible Chronic Kidney Disease.   . Anion gap 07/29/2015 6  5 - 15 Final  . WBC 07/30/2015 11.4* 4.0 - 10.5 K/uL Final  . RBC 07/30/2015 4.25  4.22 - 5.81 MIL/uL Final  . Hemoglobin 07/30/2015 10.1* 13.0 - 17.0 g/dL Final  . HCT 07/30/2015 32.1* 39.0 - 52.0 % Final  . MCV 07/30/2015 75.5* 78.0 - 100.0 fL Final  . MCH 07/30/2015 23.8* 26.0 - 34.0 pg Final  . MCHC 07/30/2015 31.5  30.0 - 36.0 g/dL Final  . RDW 07/30/2015 13.7  11.5 - 15.5 % Final  . Platelets 07/30/2015 188  150 - 400 K/uL Final  . Sodium 07/30/2015 139  135 - 145 mmol/L Final  . Potassium 07/30/2015 3.8  3.5 - 5.1 mmol/L Final  .  Chloride 07/30/2015 102  101 - 111 mmol/L Final  . CO2 07/30/2015 27  22 - 32 mmol/L Final  . Glucose, Bld 07/30/2015 125* 65 - 99 mg/dL Final  . BUN 07/30/2015 13  6 - 20 mg/dL Final  . Creatinine, Ser 07/30/2015 0.93  0.61 - 1.24 mg/dL Final  . Calcium 07/30/2015 8.9  8.9 - 10.3 mg/dL Final  . GFR calc non Af Amer 07/30/2015 >60  >60 mL/min Final  . GFR calc Af Amer 07/30/2015 >60  >60 mL/min Final   Comment: (NOTE) The eGFR has been calculated using the CKD EPI equation. This calculation has not been validated in all clinical situations. eGFR's persistently <60 mL/min signify possible Chronic Kidney Disease.   Georgiann Hahn gap 07/30/2015 10  5 - 15 Final  Hospital Outpatient Visit on 07/21/2015   Component Date Value Ref Range Status  . aPTT 07/21/2015 34  24 - 37 seconds Final  . WBC 07/21/2015 4.7  4.0 - 10.5 K/uL Final  . RBC 07/21/2015 5.28  4.22 - 5.81 MIL/uL Final  . Hemoglobin 07/21/2015 13.1  13.0 - 17.0 g/dL Final  . HCT 07/21/2015 40.7  39.0 - 52.0 % Final  . MCV 07/21/2015 77.1* 78.0 - 100.0 fL Final  . MCH 07/21/2015 24.8* 26.0 - 34.0 pg Final  . MCHC 07/21/2015 32.2  30.0 - 36.0 g/dL Final  . RDW 07/21/2015 13.8  11.5 - 15.5 % Final  . Platelets 07/21/2015 234  150 - 400 K/uL Final  . Sodium 07/21/2015 136  135 - 145 mmol/L Final  . Potassium 07/21/2015 4.0  3.5 - 5.1 mmol/L Final  . Chloride 07/21/2015 104  101 - 111 mmol/L Final  . CO2 07/21/2015 25  22 - 32 mmol/L Final  . Glucose, Bld 07/21/2015 110* 65 - 99 mg/dL Final  . BUN 07/21/2015 13  6 - 20 mg/dL Final  . Creatinine, Ser 07/21/2015 1.23  0.61 - 1.24 mg/dL Final  . Calcium 07/21/2015 9.1  8.9 - 10.3 mg/dL Final  . Total Protein 07/21/2015 7.6  6.5 - 8.1 g/dL Final  . Albumin 07/21/2015 4.4  3.5 - 5.0 g/dL Final  . AST 07/21/2015 37  15 - 41 U/L Final  . ALT 07/21/2015 34  17 - 63 U/L Final  . Alkaline Phosphatase 07/21/2015 75  38 - 126 U/L Final  . Total Bilirubin 07/21/2015 0.4  0.3 - 1.2 mg/dL Final  . GFR calc non Af Amer 07/21/2015 >60  >60 mL/min Final  . GFR calc Af Amer 07/21/2015 >60  >60 mL/min Final   Comment: (NOTE) The eGFR has been calculated using the CKD EPI equation. This calculation has not been validated in all clinical situations. eGFR's persistently <60 mL/min signify possible Chronic Kidney Disease.   . Anion gap 07/21/2015 7  5 - 15 Final  . Prothrombin Time 07/21/2015 14.1  11.6 - 15.2 seconds Final  . INR 07/21/2015 1.07  0.00 - 1.49 Final  . ABO/RH(D) 07/21/2015 A POS   Final  . Antibody Screen 07/21/2015 NEG   Final  . Sample Expiration 07/21/2015 07/31/2015   Final  . Color, Urine 07/21/2015 YELLOW  YELLOW Final  . APPearance 07/21/2015 CLEAR  CLEAR Final  .  Specific Gravity, Urine 07/21/2015 1.023  1.005 - 1.030 Final  . pH 07/21/2015 5.5  5.0 - 8.0 Final  . Glucose, UA 07/21/2015 NEGATIVE  NEGATIVE mg/dL Final  . Hgb urine dipstick 07/21/2015 NEGATIVE  NEGATIVE Final  . Bilirubin Urine 07/21/2015 NEGATIVE  NEGATIVE Final  . Ketones, ur 07/21/2015 NEGATIVE  NEGATIVE mg/dL Final  . Protein, ur 07/21/2015 NEGATIVE  NEGATIVE mg/dL Final  . Urobilinogen, UA 07/21/2015 0.2  0.0 - 1.0 mg/dL Final  . Nitrite 07/21/2015 NEGATIVE  NEGATIVE Final  . Leukocytes, UA 07/21/2015 NEGATIVE  NEGATIVE Final   MICROSCOPIC NOT DONE ON URINES WITH NEGATIVE PROTEIN, BLOOD, LEUKOCYTES, NITRITE, OR GLUCOSE <1000 mg/dL.  Marland Kitchen MRSA, PCR 07/21/2015 NEGATIVE  NEGATIVE Final  . Staphylococcus aureus 07/21/2015 NEGATIVE  NEGATIVE Final   Comment:        The Xpert SA Assay (FDA approved for NASAL specimens in patients over 67 years of age), is one component of a comprehensive surveillance program.  Test performance has been validated by [facility] for patients greater than or equal to 65 year old. It is not intended to diagnose infection nor to guide or monitor treatment.   . ABO/RH(D) 07/21/2015 A POS   Final     X-Rays:No results found.  EKG: Orders placed or performed during the hospital encounter of 07/21/15  . EKG 12-Lead  . EKG 12-Lead     Hospital Course: Tim [name] is a 59 y.o. who was admitted to [facility]. They were brought to the operating room on 07/28/2015 and underwent Procedure(s): LEFT TOTAL KNEE ARTHROPLASTY.  Patient tolerated the procedure well and was later transferred to the recovery room and then to the orthopaedic floor for postoperative care.  They were given PO and IV analgesics for pain control following their surgery.  They were given 24 hours of postoperative antibiotics of  Anti-infectives    Start     Dose/Rate Route Frequency Ordered Stop   07/28/15 1930  ceFAZolin (ANCEF) IVPB 2 g/50 mL premix     2 g 100 mL/hr over  30 Minutes Intravenous Every 6 hours 07/28/15 1815 07/29/15 0241   07/28/15 0600  ceFAZolin (ANCEF) 3 g in dextrose 5 % 50 mL IVPB     3 g 160 mL/hr over 30 Minutes Intravenous On call to O.R. 07/27/15 1254 07/28/15 1334     and started on DVT prophylaxis in the form of Xarelto.   PT and OT were ordered for total joint protocol.  Discharge planning consulted to help with postop disposition and equipment needs.  Patient had a decent night on the evening of surgery.  They started to get up OOB with therapy on day one. Hemovac drain was pulled without difficulty.  Continued to work with therapy into day two.  Dressing was changed on day two and the incision was healing well. Patient was seen in rounds and was ready to go home.  Discharge home with home health Diet - Cardiac diet Follow up - in 2 weeks Activity - WBAT Disposition - Home Condition Upon Discharge - Good D/C Meds - See DC Summary DVT Prophylaxis - Xarelto  Discharge Instructions    Call MD / Call 911    Complete by:  As directed   If you experience chest pain or shortness of breath, CALL 911 and be transported to the hospital emergency room.  If you develope a fever above 101 F, pus (white drainage) or increased drainage or redness at the wound, or calf pain, call your surgeon's office.     Change dressing    Complete by:  As directed   Change dressing daily with sterile 4 x 4 inch gauze dressing and apply TED hose. Do not submerge the incision under water.     Constipation  Prevention    Complete by:  As directed   Drink plenty of fluids.  Prune juice may be helpful.  You may use a stool softener, such as Colace (over the counter) 100 mg twice a day.  Use MiraLax (over the counter) for constipation as needed.     Diet - low sodium heart healthy    Complete by:  As directed      Discharge instructions    Complete by:  As directed   Pick up stool softner and laxative for home use following surgery while on pain medications. Do  not submerge incision under water. Please use good hand washing techniques while changing dressing each day. May shower starting three days after surgery. Please use a clean towel to pat the incision dry following showers. Continue to use ice for pain and swelling after surgery. Do not use any lotions or creams on the incision until instructed by your surgeon.  Take Xarelto for two and a half more weeks, then discontinue Xarelto. Once the patient has completed the Xarelto, they may resume the 81 mg Aspirin.  Postoperative Constipation Protocol  Constipation - defined medically as fewer than three stools per week and severe constipation as less than one stool per week.  One of the most common issues patients have following surgery is constipation.  Even if you have a regular bowel pattern at home, your normal regimen is likely to be disrupted due to multiple reasons following surgery.  Combination of anesthesia, postoperative narcotics, change in appetite and fluid intake all can affect your bowels.  In order to avoid complications following surgery, here are some recommendations in order to help you during your recovery period.  Colace (docusate) - Pick up an over-the-counter form of Colace or another stool softener and take twice a day as long as you are requiring postoperative pain medications.  Take with a full glass of water daily.  If you experience loose stools or diarrhea, hold the colace until you stool forms back up.  If your symptoms do not get better within 1 week or if they get worse, check with your doctor.  Dulcolax (bisacodyl) - Pick up over-the-counter and take as directed by the product packaging as needed to assist with the movement of your bowels.  Take with a full glass of water.  Use this product as needed if not relieved by Colace only.   MiraLax (polyethylene glycol) - Pick up over-the-counter to have on hand.  MiraLax is a solution that will increase the amount of water in  your bowels to assist with bowel movements.  Take as directed and can mix with a glass of water, juice, soda, coffee, or tea.  Take if you go more than two days without a movement. Do not use MiraLax more than once per day. Call your doctor if you are still constipated or irregular after using this medication for 7 days in a row.  If you continue to have problems with postoperative constipation, please contact the office for further assistance and recommendations.  If you experience "the worst abdominal pain ever" or develop nausea or vomiting, please contact the office immediatly for further recommendations for treatment.     Do not put a pillow under the knee. Place it under the heel.    Complete by:  As directed      Do not sit on low chairs, stoools or toilet seats, as it may be difficult to get up from low surfaces    Complete  by:  As directed      Driving restrictions    Complete by:  As directed   No driving until released by the physician.     Increase activity slowly as tolerated    Complete by:  As directed      Lifting restrictions    Complete by:  As directed   No lifting until released by the physician.     Patient may shower    Complete by:  As directed   You may shower without a dressing once there is no drainage.  Do not wash over the wound.  If drainage remains, do not shower until drainage stops.     TED hose    Complete by:  As directed   Use stockings (TED hose) for 3 weeks on both leg(s).  You may remove them at night for sleeping.     Weight bearing as tolerated    Complete by:  As directed   Laterality:  left  Extremity:  Lower            Medication List    STOP taking these medications        aspirin 81 MG tablet     GINKGO BILOBA PO     SAW PALMETTO PO      TAKE these medications        amLODipine 5 MG tablet  Commonly known as:  NORVASC  Take 5 mg by mouth daily.     atorvastatin 20 MG tablet  Commonly known as:  LIPITOR  Take 20 mg by mouth  daily.     metFORMIN 500 MG 24 hr tablet  Commonly known as:  GLUCOPHAGE-XR  Take 500 mg by mouth 2 (two) times daily.     methocarbamol 500 MG tablet  Commonly known as:  ROBAXIN  Take 1 tablet (500 mg total) by mouth every 6 (six) hours as needed for muscle spasms.     oxyCODONE 5 MG immediate release tablet  Commonly known as:  Oxy IR/ROXICODONE  Take 1-2 tablets (5-10 mg total) by mouth every 3 (three) hours as needed for moderate pain or severe pain.     rivaroxaban 10 MG Tabs tablet  Commonly known as:  XARELTO  Take 1 tablet (10 mg total) by mouth daily with breakfast. Take Xarelto for two and a half more weeks, then discontinue Xarelto. Once the patient has completed the Xarelto, they may resume the 81 mg Aspirin.     traMADol 50 MG tablet  Commonly known as:  ULTRAM  Take 1-2 tablets (50-100 mg total) by mouth every 6 (six) hours as needed (mild pain).     valsartan 160 MG tablet  Commonly known as:  DIOVAN  Take 160 mg by mouth daily.           Follow-up Information    Follow up with [facility].   Why:  home health physical therapy   Contact information:   [address] [address] [phone]       Follow up with [facility].   Why:  3n1 (over the commode seat) and rolling walker   Contact information:   [address] [place]  [address] [phone]       Follow up with Gearlean Alf, MD. Schedule an appointment as soon as possible for a visit on 08/14/2015.   Specialty:  Orthopedic Surgery   Why:  Call office at [phone] to setup appointment  on Thursday 8/25 with Dr. Wynelle Link.   Contact information:   [address] [place] [address] [phone]       Signed: Arlee Muslim, PA-C Orthopaedic Surgery 07/30/2015, 9:29 AM

## 2015-08-20 ENCOUNTER — Ambulatory Visit: Payer: Managed Care, Other (non HMO) | Attending: Orthopedic Surgery | Admitting: Physical Therapy

## 2015-08-20 ENCOUNTER — Encounter: Payer: Self-pay | Admitting: Physical Therapy

## 2015-08-20 DIAGNOSIS — M25662 Stiffness of left knee, not elsewhere classified: Secondary | ICD-10-CM

## 2015-08-20 DIAGNOSIS — M25562 Pain in left knee: Secondary | ICD-10-CM | POA: Diagnosis not present

## 2015-08-20 DIAGNOSIS — R262 Difficulty in walking, not elsewhere classified: Secondary | ICD-10-CM | POA: Insufficient documentation

## 2015-08-20 DIAGNOSIS — M25462 Effusion, left knee: Secondary | ICD-10-CM

## 2015-08-20 NOTE — Therapy (Signed)
[facility] [address] [place], [place], [address] Phone: [phone]   Fax:  [phone]  Physical Therapy Evaluation  Patient Details  Name: Tim [name] MRN: [id number] Date of Birth: [birth year] Referring Provider:  Ollen Gross, MD  Encounter Date: 08/20/2015      PT End of Session - 08/20/15 1152    Visit Number 1   Date for PT Re-Evaluation 10/20/15   PT Start Time 1100   PT Stop Time 1151   PT Time Calculation (min) 51 min   Activity Tolerance Patient tolerated treatment well   Behavior During Therapy [facility] for tasks assessed/performed      Past Medical History  Diagnosis Date  . Hypertension   . Arthritis     Past Surgical History  Procedure Laterality Date  . Total knee arthroplasty Left 07/28/2015    Procedure: LEFT TOTAL KNEE ARTHROPLASTY;  Surgeon: Ollen Gross, MD;  Location: [facility];  Service: Orthopedics;  Laterality: Left;    There were no vitals filed for this visit.  Visit Diagnosis:  Left knee pain - Plan: PT plan of care cert/re-cert  Knee stiffness, left - Plan: PT plan of care cert/re-cert  Difficulty walking - Plan: PT plan of care cert/re-cert  Swelling of left knee joint - Plan: PT plan of care cert/re-cert      Subjective Assessment - 08/20/15 1100    Subjective Patient underwent a left TKR on 07/28/15.  Reports that he had knee pain for about 3 years prior.  Reports that he has been struggling with bending the knee with home health PT   Limitations Standing;Walking;House hold activities   How long can you sit comfortably? 30   How long can you stand comfortably? 30   How long can you walk comfortably? 30   Patient Stated Goals gewt my motions back, go back to work   Currently in Pain? Yes   Pain Score 2    Pain Location Knee   Pain Orientation Left   Pain Descriptors / Indicators Aching;Tightness   Pain Type Surgical pain   Pain Onset 1 to 4 weeks ago   Pain Frequency  Intermittent   Aggravating Factors  straightening the knee or bending the knee pain up to 6/10   Pain Relieving Factors rest and ice   Effect of Pain on Daily Activities limits ADL's, out of work until October 2nd            [facility] PT Assessment - 08/20/15 0001    Assessment   Medical Diagnosis left TKR   Onset Date/Surgical Date 07/28/15   Next MD Visit 09/09/15   Prior Therapy home PT   Precautions   Precautions None   Restrictions   Weight Bearing Restrictions No   Balance Screen   Has the patient fallen in the past 6 months No   Has the patient had a decrease in activity level because of a fear of falling?  No   Is the patient reluctant to leave their home because of a fear of falling?  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, some squatting   Leisure was playing some basketball   AROM   Left Knee Extension 25  Left Knee Flexion 77   PROM   Overall PROM Comments PROM 20-82 degrees flexion   Strength   Overall Strength Comments 4-/5   Flexibility   Soft Tissue Assessment /Muscle Length --  very tight HS and calf   Palpation   Palpation comment ballotable patella, very tight and a lot of swelling, circumferential measurements at mid patella left 49.1 cm, right 43.3   Ambulation/Gait   Gait Comments SPC, slow, very bent knee , antalgic on the left, forward flexed posture                   [facility] Adult PT Treatment/Exercise - 08/20/15 0001    Knee/Hip Exercises: Aerobic   Nustep Level 5 x 6 minutes   Knee/Hip Exercises: Machines for Strengthening   Cybex Knee Flexion 20# left leg only   Cybex Leg Press 30# and no weight some with both legs and some with just the left leg                PT Education -  08/20/15 1151    Education provided Yes   Education Details low load long duration for flexion and extension   Person(s) Educated Patient   Methods Explanation;Demonstration;Handout   Comprehension Verbalized understanding;Returned demonstration          PT Short Term Goals - 08/20/15 1156    PT SHORT TERM GOAL #1   Title independent with initial HEP   Time 2   Period Weeks   Status New   PT SHORT TERM GOAL #2   Title --   PT SHORT TERM GOAL #3   Title --   Time --   Period --   Status --   PT SHORT TERM GOAL #4   Title --   Time --   Period --   Status --   PT SHORT TERM GOAL #5   Title go up and down stairs reciprocally   Time 8   Period Weeks   Status New           PT Long Term Goals - 08/20/15 1159    PT LONG TERM GOAL #3   Title walk community distances without assistive device and minimal deviation   Time 8   Period Weeks   Status New   PT LONG TERM GOAL #4   Title go up and down stairs reciprocally   Time 8   Period Weeks   Status New               Plan - 08/20/15 1154    Clinical Impression Statement Patient had left TKR, has very poor ROM.  AROM 25-77 degrees flexion, significant edema of the left knee.  Poor gait with the knee held in flexion   Pt will benefit from skilled therapeutic intervention in order to improve on the following deficits Abnormal gait;Decreased mobility;Decreased range of motion;Difficulty walking;Decreased strength;Increased edema;Pain   Rehab Potential Good   PT Frequency 3x / week   PT Duration 8 weeks   PT Treatment/Interventions Electrical Stimulation;Cryotherapy;Gait training;Neuromuscular re-education;Therapeutic exercise;Therapeutic activities;Functional mobility training;Stair training;Patient/family education;Manual techniques   PT Next Visit Plan add exercises and manual stretching    Consulted and Agree with Plan of Care Patient         Problem List Patient Active Problem List   Diagnosis Date  Noted  . OA (osteoarthritis) of knee 07/28/2015    Jearld Lesch., PT 08/20/2015, 12:03 PM  [facility] [phone]  [address] [place], [place], [address] Phone: [phone]   Fax:  [phone]

## 2015-08-22 ENCOUNTER — Ambulatory Visit: Payer: Managed Care, Other (non HMO) | Attending: Orthopedic Surgery | Admitting: Physical Therapy

## 2015-08-22 ENCOUNTER — Encounter: Payer: Self-pay | Admitting: Physical Therapy

## 2015-08-22 DIAGNOSIS — M25462 Effusion, left knee: Secondary | ICD-10-CM | POA: Diagnosis present

## 2015-08-22 DIAGNOSIS — M25562 Pain in left knee: Secondary | ICD-10-CM | POA: Insufficient documentation

## 2015-08-22 DIAGNOSIS — M25662 Stiffness of left knee, not elsewhere classified: Secondary | ICD-10-CM

## 2015-08-22 DIAGNOSIS — R262 Difficulty in walking, not elsewhere classified: Secondary | ICD-10-CM | POA: Diagnosis present

## 2015-08-22 NOTE — Therapy (Signed)
[place], [place], [address] Phone: [phone]   Fax:  [phone]  Physical Therapy Treatment  Patient Details  Name: Tim [name] MRN: [id number] Date of Birth: 1956/08/06 Referring Provider:  Gaynelle Arabian, MD  Encounter Date: 08/22/2015      PT End of Session - 08/22/15 1142    Visit Number 2   PT Start Time 1100   PT Stop Time 1151   PT Time Calculation (min) 51 min   Activity Tolerance Patient tolerated treatment well   Behavior During Therapy [facility] for tasks assessed/performed      Past Medical History  Diagnosis Date  . Hypertension   . Arthritis     Past Surgical History  Procedure Laterality Date  . Total knee arthroplasty Left 07/28/2015    Procedure: LEFT TOTAL KNEE ARTHROPLASTY;  Surgeon: Gaynelle Arabian, MD;  Location: [facility];  Service: Orthopedics;  Laterality: Left;    There were no vitals filed for this visit.  Visit Diagnosis:  Left knee pain  Knee stiffness, left  Swelling of left knee joint      Subjective Assessment - 08/22/15 1102    Subjective Pt reports that he did a little workout yesterday (HEP) feels like he is making progress in regards to straightening his LLE.    Patient Stated Goals get my motions back, go back to work   Currently in Pain? Yes   Pain Score 5    Pain Location Knee   Pain Orientation Left   Pain Descriptors / Indicators Tightness   Pain Type Surgical pain                         [facility] Adult PT Treatment/Exercise - 08/22/15 0001    Exercises   Exercises Knee/Hip   Knee/Hip Exercises: Aerobic   Nustep Level 4 x 7 minutes   Knee/Hip Exercises: Machines for Strengthening   Cybex Knee Flexion 20# left leg only 3 sets 10 reps    Cybex Leg Press #5# 5 reps 2 sets LLE     Other Machine low load long duration stretch 10 reps 2 sets    Knee/Hip Exercises: Standing   Hip Abduction 15 reps;Left;AROM;2 sets   Hip  Extension 15 reps;Left;AROM;2 sets   Knee/Hip Exercises: Supine   Short Arc Quad Sets 20 reps;Left   Straight Leg Raises Left;10 reps   Modalities   Modalities Cryotherapy   Cryotherapy   Number Minutes Cryotherapy 10 Minutes   Cryotherapy Location Knee   Type of Cryotherapy Ice pack   Manual Therapy   Manual Therapy Joint mobilization;Soft tissue mobilization;Passive ROM   Manual therapy comments very tight hamstrings    Joint Mobilization patellar mobs grades 2-3    Soft tissue mobilization L hamstring    Passive ROM L knee flexion and extension                  PT Short Term Goals - 08/22/15 1145    PT SHORT TERM GOAL #1   Title independent with initial HEP   Status Partially Met           PT Long Term Goals - 08/20/15 1159    PT LONG TERM GOAL #3   Title walk community distances without assistive device and minimal deviation   Time 8   Period Weeks   Status New   PT LONG TERM GOAL #4   Title go up  and down stairs reciprocally   Time 8   Period Weeks   Status New               Plan - 08/22/15 1143    Clinical Impression Statement Pt tolerated treatment well. Pt able to reach level 10 on leg press with low load resisted stretching with LLE. Pt pleasant and give good effort, but is limited with L knee ROM   Pt will benefit from skilled therapeutic intervention in order to improve on the following deficits Abnormal gait;Decreased mobility;Decreased range of motion;Difficulty walking;Decreased strength;Increased edema;Pain   Rehab Potential Good   PT Frequency 3x / week   PT Duration 8 weeks   PT Treatment/Interventions Electrical Stimulation;Cryotherapy;Gait training;Neuromuscular re-education;Therapeutic exercise;Therapeutic activities;Functional mobility training;Stair training;Patient/family education;Manual techniques   PT Next Visit Plan continue to add exercises and manual stretching         Problem List Patient Active Problem List    Diagnosis Date Noted  . OA (osteoarthritis) of knee 07/28/2015    Scot Jun, PTA  08/22/2015, 11:46 AM  [place] [address] [place], [place], [address] Phone: [phone]   Fax:  [phone]

## 2015-08-26 ENCOUNTER — Encounter: Payer: Self-pay | Admitting: Physical Therapy

## 2015-08-26 ENCOUNTER — Ambulatory Visit: Payer: Managed Care, Other (non HMO) | Admitting: Physical Therapy

## 2015-08-26 DIAGNOSIS — M25662 Stiffness of left knee, not elsewhere classified: Secondary | ICD-10-CM

## 2015-08-26 DIAGNOSIS — M25562 Pain in left knee: Secondary | ICD-10-CM | POA: Diagnosis not present

## 2015-08-26 DIAGNOSIS — R262 Difficulty in walking, not elsewhere classified: Secondary | ICD-10-CM

## 2015-08-26 NOTE — Therapy (Signed)
[place], [place], [address] Phone: [phone]   Fax:  [phone]  Physical Therapy Treatment  Patient Details  Name: Tim [name] MRN: [id number] Date of Birth: November 08, 1956 Referring Provider:  Gaynelle Arabian, MD  Encounter Date: 08/26/2015      PT End of Session - 08/26/15 1132    Visit Number 3   Date for PT Re-Evaluation 10/20/15   PT Start Time 1106   PT Stop Time 1200   PT Time Calculation (min) 54 min      Past Medical History  Diagnosis Date  . Hypertension   . Arthritis     Past Surgical History  Procedure Laterality Date  . Total knee arthroplasty Left 07/28/2015    Procedure: LEFT TOTAL KNEE ARTHROPLASTY;  Surgeon: Gaynelle Arabian, MD;  [facility]: [facility];  Service: Orthopedics;  Laterality: Left;    There were no vitals filed for this visit.  Visit Diagnosis:  Knee stiffness, left  Difficulty walking      Subjective Assessment - 08/26/15 1108    Subjective wife helped me work on straightening leg this weekend.hurts to walk on or bend   Pain Score 5    Pain Location Knee   Pain Orientation Left                         [facility] Adult PT Treatment/Exercise - 08/26/15 0001    Ambulation/Gait   Gait Comments amb without AD working on heel strike, pt tends to toe walk- holds knee in flexion   Knee/Hip Exercises: Aerobic   Elliptical 2 fwd/2 back   Nustep Level 4 x 85minutes   Knee/Hip Exercises: Machines for Strengthening   Cybex Knee Extension 10 # 2 sets 10  up with 2 down with Left   Cybex Knee Flexion 20# left leg only 3 sets 10 eps    Cybex Leg Press 40# 2 sets 10, left only 20# 10 times   Knee/Hip Exercises: Seated   Other Seated Knee/Hip Exercises fitter 2 blue flex and ext 2 sets 10   Cryotherapy   Number Minutes Cryotherapy 10 Minutes   Cryotherapy Location Knee   Type of Cryotherapy Ice pack   Manual Therapy   Manual Therapy Joint  mobilization;Passive ROM;Other (comment)  belt mobs for flexion                PT Education - 08/26/15 1123    Education provided Yes   Education Details LLLD step stretch and calf stretch on step   Methods Explanation;Demonstration   Comprehension Verbalized understanding;Returned demonstration          PT Short Term Goals - 08/22/15 1145    PT SHORT TERM GOAL #1   Title independent with initial HEP   Status Partially Met           PT Long Term Goals - 08/20/15 1159    PT LONG TERM GOAL #3   Title walk community distances without assistive device and minimal deviation   Time 8   Period Weeks   Status New   PT LONG TERM GOAL #4   Title go up and down stairs reciprocally   Time 8   Period Weeks   Status New               Plan - 08/26/15 1132    Clinical Impression Statement pt amb without AD but holds knee in flexion and  missing heel strike. Pt tolerated ther ex but needed VCing for increased ROM and correct muscle activation. Pt very quarded to MT and PROM.   PT Next Visit Plan Gait/ Manual therapy for ROM, quad strength        Problem List Patient Active Problem List   Diagnosis Date Noted  . OA (osteoarthritis) of knee 07/28/2015    PAYSEUR,ANGIE PTA 08/26/2015, 11:35 AM  [place] [address] [place], [place], [address] Phone: [phone]   Fax:  [phone]

## 2015-08-28 ENCOUNTER — Ambulatory Visit: Payer: Managed Care, Other (non HMO) | Admitting: Physical Therapy

## 2015-08-28 ENCOUNTER — Encounter: Payer: Self-pay | Admitting: Physical Therapy

## 2015-08-28 DIAGNOSIS — R262 Difficulty in walking, not elsewhere classified: Secondary | ICD-10-CM

## 2015-08-28 DIAGNOSIS — M25662 Stiffness of left knee, not elsewhere classified: Secondary | ICD-10-CM

## 2015-08-28 DIAGNOSIS — M25562 Pain in left knee: Secondary | ICD-10-CM

## 2015-08-28 NOTE — Therapy (Signed)
[facility] [place] [place] [address] [place], [place], [address] Phone: [phone]   Fax:  [phone]  Physical Therapy Treatment  Patient Details  Name: Tim [name] MRN: [id number] Date of Birth: 09-01-1956 Referring Provider:  Ollen Gross, MD  Encounter Date: 08/28/2015      PT End of Session - 08/28/15 1053    Visit Number 4   Date for PT Re-Evaluation 10/20/15   PT Start Time 1017   PT Stop Time 1120   PT Time Calculation (min) 63 min      Past Medical History  Diagnosis Date  . Hypertension   . Arthritis     Past Surgical History  Procedure Laterality Date  . Total knee arthroplasty Left 07/28/2015    Procedure: LEFT TOTAL KNEE ARTHROPLASTY;  Surgeon: Ollen Gross, MD;  [facility];  Service: Orthopedics;  Laterality: Left;    There were no vitals filed for this visit.  Visit Diagnosis:  Knee stiffness, left  Difficulty walking  Left knee pain      Subjective Assessment - 08/28/15 1020    Subjective working hard at home   Currently in Pain? Yes   Pain Score 5    Pain Location Knee   Pain Orientation Left            [facility] PT Assessment - 08/28/15 0001    AROM   Left Knee Extension 17   Left Knee Flexion 92                     [facility] Adult PT Treatment/Exercise - 08/28/15 0001    Knee/Hip Exercises: Aerobic   Elliptical 3 fwd/3back   Recumbent Bike 5 min, working on full rev.   Knee/Hip Exercises: Machines for Strengthening   Cybex Knee Extension 10 # 2 sets 10  left only   Cybex Knee Flexion 20# left leg only 3 sets 10 eps    Cybex Leg Press 20 # left only 3 sets 10  PTA over pressure for flexion 100 degress   Total Gym Leg Press calf raises Left 30# 2 sets 10   Knee/Hip Exercises: Standing   Other Standing Knee Exercises TKE with ball 15 times hold 3 sec   Other Standing Knee Exercises cable pull down 50 # 2 sets 15   Knee/Hip Exercises: Seated   Other Seated  Knee/Hip Exercises fitter 2 blue flex and ext 2 sets 10   Cryotherapy   Number Minutes Cryotherapy 10 Minutes   Cryotherapy Location Knee   Type of Cryotherapy Ice pack                  PT Short Term Goals - 08/28/15 1055    PT SHORT TERM GOAL #1   Title independent with initial HEP   Status Achieved           PT Long Term Goals - 08/20/15 1159    PT LONG TERM GOAL #3   Title walk community distances without assistive device and minimal deviation   Time 8   Period Weeks   Status New   PT LONG TERM GOAL #4   Title go up and down stairs reciprocally   Time 8   Period Weeks   Status New               Plan - 08/28/15 1054    Clinical Impression Statement pt improving with ROM but still very limited, informed pt that MD  may discuss manipulation next week. Pt verb and appears to be working very hard at home but limited both ext and flexion which is affect gait as well as ADLS   PT Next Visit Plan Gait/ Manual therapy for ROM, quad strength    MD 9/16        Problem List Patient Active Problem List   Diagnosis Date Noted  . OA (osteoarthritis) of knee 07/28/2015    PAYSEUR,ANGIE PTA 08/28/2015, 10:59 AM  [facility] [address] [place], [place], [address] Phone: [phone]   Fax:  [phone]

## 2015-09-01 ENCOUNTER — Encounter: Payer: Self-pay | Admitting: Physical Therapy

## 2015-09-01 ENCOUNTER — Ambulatory Visit: Payer: Managed Care, Other (non HMO) | Admitting: Physical Therapy

## 2015-09-01 DIAGNOSIS — M25562 Pain in left knee: Secondary | ICD-10-CM | POA: Diagnosis not present

## 2015-09-01 DIAGNOSIS — M25662 Stiffness of left knee, not elsewhere classified: Secondary | ICD-10-CM

## 2015-09-01 DIAGNOSIS — R262 Difficulty in walking, not elsewhere classified: Secondary | ICD-10-CM

## 2015-09-01 NOTE — Therapy (Signed)
[place], [place], [address] Phone: [phone]   Fax:  [phone]  Physical Therapy Treatment  Patient Details  Name: Tim [name] MRN: [id number] Date of Birth: [birth year] Referring Provider:  Gaynelle Arabian, MD  Encounter Date: 09/01/2015      PT End of Session - 09/01/15 1148    Visit Number 5   Date for PT Re-Evaluation 10/20/15   PT Start Time 1103   PT Stop Time 1159   PT Time Calculation (min) 56 min   Activity Tolerance Patient tolerated treatment well   Behavior During Therapy [facility] for tasks assessed/performed      Past Medical History  Diagnosis Date  . Hypertension   . Arthritis     Past Surgical History  Procedure Laterality Date  . Total knee arthroplasty Left 07/28/2015    Procedure: LEFT TOTAL KNEE ARTHROPLASTY;  Surgeon: Gaynelle Arabian, MD;  [facility]: [facility];  Service: Orthopedics;  Laterality: Left;    There were no vitals filed for this visit.  Visit Diagnosis:  Difficulty walking  Knee stiffness, left  Left knee pain      Subjective Assessment - 09/01/15 1104    Subjective Pt reports stiffness this morning. Pt ambulated in clinic without AD   Currently in Pain? Yes   Pain Score 1    Pain Location Knee   Pain Orientation Right   Pain Descriptors / Indicators Tightness   Pain Type Surgical pain                         [facility] Adult PT Treatment/Exercise - 09/01/15 0001    Ambulation/Gait   Gait Comments amb without AD working on heel strike, pt tends to toe walk- holds knee in flexion   Knee/Hip Exercises: Aerobic   Elliptical I10 R 1 3 fwd/3back   Recumbent Bike 5 min, working on full rev.   Knee/Hip Exercises: Machines for Strengthening   Cybex Knee Extension 10 # 2 sets 10   Cybex Knee Flexion 20# left leg only 3 sets 15 reps    Cybex Leg Press 20 # left only 3 sets 15   Total Gym Leg Press calf raises Left 0# 30 reps   Other  Machine low load long duration stretch 10 reps 2 sets on leg press    Modalities   Modalities Cryotherapy;Vasopneumatic   Cryotherapy   Number Minutes Cryotherapy 15 Minutes   Cryotherapy Location Knee   Type of Cryotherapy Other (comment)  Game ready    Vasopneumatic   Number Minutes Vasopneumatic  15 minutes   Vasopnuematic Location  Knee   Vasopneumatic Pressure Low   Vasopneumatic Temperature  32                  PT Short Term Goals - 08/28/15 1055    PT SHORT TERM GOAL #1   Title independent with initial HEP   Status Achieved           PT Long Term Goals - 09/01/15 1154    PT LONG TERM GOAL #1   Title increase AROM of the left knee to 5-115 degrees flexion   Status On-going   PT LONG TERM GOAL #2   Title decrease pain 50%   Status On-going   PT LONG TERM GOAL #3   Title walk community distances without assistive device and minimal deviation   Status Partially Met  Plan - 09/01/15 1152    Clinical Impression Statement Pt ROM improved with low load long duration stretch on leg press. Pt continues to work hard in PT and reports that he works hard at home as well. Pt continues to be limited with ext and flexion.   Pt will benefit from skilled therapeutic intervention in order to improve on the following deficits Abnormal gait;Decreased mobility;Decreased range of motion;Difficulty walking;Decreased strength;Increased edema;Pain   Rehab Potential Good   PT Frequency 3x / week   PT Duration 8 weeks   PT Treatment/Interventions Electrical Stimulation;Cryotherapy;Gait training;Neuromuscular re-education;Therapeutic exercise;Therapeutic activities;Functional mobility training;Stair training;Patient/family education;Manual techniques   PT Next Visit Plan Gait/ Manual therapy for ROM, quad strength    MD 9/16        Problem List Patient Active Problem List   Diagnosis Date Noted  . OA (osteoarthritis) of knee 07/28/2015    Scot Jun, PTA  09/01/2015, 11:56 AM  [place] [address] [place] [address] [place], [place], [address] Phone: [phone]   Fax:  [phone]

## 2015-09-04 ENCOUNTER — Ambulatory Visit: Payer: Managed Care, Other (non HMO) | Admitting: Physical Therapy

## 2015-09-04 ENCOUNTER — Encounter: Payer: Self-pay | Admitting: Physical Therapy

## 2015-09-04 DIAGNOSIS — M25462 Effusion, left knee: Secondary | ICD-10-CM

## 2015-09-04 DIAGNOSIS — R262 Difficulty in walking, not elsewhere classified: Secondary | ICD-10-CM

## 2015-09-04 DIAGNOSIS — M25562 Pain in left knee: Secondary | ICD-10-CM

## 2015-09-04 DIAGNOSIS — M25662 Stiffness of left knee, not elsewhere classified: Secondary | ICD-10-CM

## 2015-09-04 NOTE — Therapy (Signed)
[place], [place], [address] Phone: [phone]   Fax:  [phone]  Physical Therapy Treatment  Patient Details  Name: Tim [name] MRN: [id number] Date of Birth: [birth year] Referring Provider:  Gaynelle Arabian, MD  Encounter Date: 09/04/2015      PT End of Session - 09/04/15 1226    Visit Number 6   PT Start Time 5625   PT Stop Time 6389   PT Time Calculation (min) 57 min   Activity Tolerance Patient tolerated treatment well   Behavior During Therapy [facility] for tasks assessed/performed      Past Medical History  Diagnosis Date  . Hypertension   . Arthritis     Past Surgical History  Procedure Laterality Date  . Total knee arthroplasty Left 07/28/2015    Procedure: LEFT TOTAL KNEE ARTHROPLASTY;  Surgeon: Gaynelle Arabian, MD;  [facility]: [facility];  Service: Orthopedics;  Laterality: Left;    There were no vitals filed for this visit.  Visit Diagnosis:  Difficulty walking  Knee stiffness, left  Left knee pain  Swelling of left knee joint      Subjective Assessment - 09/04/15 1148    Subjective Pt reports that things are going fine with improved mobility. Pt walks without AD   Currently in Pain? No/denies  Took 2 pills before treatment    Pain Score 0-No pain   Pain Location Knee   Pain Descriptors / Indicators Tightness            [facility] PT Assessment - 09/04/15 0001    AROM   Left Knee Extension 20   Left Knee Flexion 92   PROM   Overall PROM Comments PROM 15-99 degrees flexion                     [facility] Adult PT Treatment/Exercise - 09/04/15 0001    Knee/Hip Exercises: Aerobic   Elliptical I10 R 5 3 fwd/3back   Recumbent Bike 3 min, working on full rev.   Knee/Hip Exercises: Machines for Strengthening   Cybex Knee Extension 10 # 2 sets 10   Cybex Knee Flexion 25# left leg only 2 sets 15 reps    Cybex Leg Press 60 # 3X10, #40 2X10 LLE    Other Machine low  load long duration stretch 10 reps on leg press    Knee/Hip Exercises: Standing   Other Standing Knee Exercises Squats holding on sink 2X10    Cryotherapy   Number Minutes Cryotherapy 15 Minutes   Cryotherapy Location Knee   Type of Cryotherapy Other (comment)  Game Ready   Vasopneumatic   Number Minutes Vasopneumatic  15 minutes   Vasopnuematic Location  Knee   Vasopneumatic Pressure Medium   Vasopneumatic Temperature  32                  PT Short Term Goals - 08/28/15 1055    PT SHORT TERM GOAL #1   Title independent with initial HEP   Status Achieved           PT Long Term Goals - 09/01/15 1154    PT LONG TERM GOAL #1   Title increase AROM of the left knee to 5-115 degrees flexion   Status On-going   PT LONG TERM GOAL #2   Title decrease pain 50%   Status On-going   PT LONG TERM GOAL #3   Title walk community distances without assistive device and minimal  deviation   Status Partially Met               Plan - 09/04/15 1227    Clinical Impression Statement Pt tolerated treatment well but has only slight improvement with ROM. Pt gives max effort during PT session. Pt able to tolerate all interventions with increase weight/resistance.   Pt will benefit from skilled therapeutic intervention in order to improve on the following deficits Abnormal gait;Decreased mobility;Decreased range of motion;Difficulty walking;Decreased strength;Increased edema;Pain   Rehab Potential Good   PT Frequency 3x / week   PT Duration 8 weeks   PT Treatment/Interventions Electrical Stimulation;Cryotherapy;Gait training;Neuromuscular re-education;Therapeutic exercise;Therapeutic activities;Functional mobility training;Stair training;Patient/family education;Manual techniques   PT Next Visit Plan Gait/ Manual therapy for ROM, quad strength    MD 9/16        Problem List Patient Active Problem List   Diagnosis Date Noted  . OA (osteoarthritis) of knee 07/28/2015     Scot Jun, PTA  09/04/2015, 12:31 PM  [place] [address] [place], [place], [address] Phone: [phone]   Fax:  [phone]

## 2015-09-08 ENCOUNTER — Encounter: Payer: Self-pay | Admitting: Physical Therapy

## 2015-09-08 ENCOUNTER — Ambulatory Visit: Payer: Managed Care, Other (non HMO) | Admitting: Physical Therapy

## 2015-09-08 DIAGNOSIS — M25662 Stiffness of left knee, not elsewhere classified: Secondary | ICD-10-CM

## 2015-09-08 DIAGNOSIS — R262 Difficulty in walking, not elsewhere classified: Secondary | ICD-10-CM

## 2015-09-08 DIAGNOSIS — M25462 Effusion, left knee: Secondary | ICD-10-CM

## 2015-09-08 DIAGNOSIS — M25562 Pain in left knee: Secondary | ICD-10-CM | POA: Diagnosis not present

## 2015-09-08 NOTE — Therapy (Signed)
[facility] [address] [place], [place], [address] Phone: [phone]   Fax:  [phone]  Physical Therapy Treatment  Patient Details  Name: Tim [name] MRN: [id number] Date of Birth: 1956/07/31 Referring Provider:  Ollen Gross, MD  Encounter Date: 09/08/2015      PT End of Session - 09/08/15 1258    Visit Number 7   Date for PT Re-Evaluation 10/20/15   PT Start Time 1151   PT Stop Time 1255   PT Time Calculation (min) 64 min   Activity Tolerance Patient tolerated treatment well   Behavior During Therapy [facility] for tasks assessed/performed      Past Medical History  Diagnosis Date  . Hypertension   . Arthritis     Past Surgical History  Procedure Laterality Date  . Total knee arthroplasty Left 07/28/2015    Procedure: LEFT TOTAL KNEE ARTHROPLASTY;  Surgeon: Ollen Gross, MD;  [facility]: [facility];  Service: Orthopedics;  Laterality: Left;    There were no vitals filed for this visit.  Visit Diagnosis:  Difficulty walking  Knee stiffness, left  Left knee pain  Swelling of left knee joint      Subjective Assessment - 09/08/15 1156    Subjective Saw MD, he wants [place] to conitnue and will reassess on October 16th, reports that he needs more ROM and at that time will assess to see if a manipulation is warranted   Currently in Pain? No/denies  but with two pain medication pills                         [facility] Adult PT Treatment/Exercise - 09/08/15 0001    Ambulation/Gait   Gait Comments stairs reciprocally up and down   Knee/Hip Exercises: Aerobic   Elliptical I15 R 5 3 fwd/3back   Knee/Hip Exercises: Machines for Strengthening   Cybex Knee Extension 10 # 2 sets 10   Cybex Knee Flexion 25# left leg only 2 sets 15 reps    Cybex Leg Press 60 # 3X10, #40 2X10 LLE    Knee/Hip Exercises: Standing   Other Standing Knee Exercises Squats holding on sink 2X10    Vasopneumatic   Number Minutes  Vasopneumatic  15 minutes   Vasopnuematic Location  Knee   Vasopneumatic Pressure Medium   Vasopneumatic Temperature  32   Manual Therapy   Manual Therapy Joint mobilization;Passive ROM;Other (comment)   Manual therapy comments very tight hamstrings    Joint Mobilization patellar mobs grades 2-3    Soft tissue mobilization L hamstring    Passive ROM L knee flexion and extension                  PT Short Term Goals - 08/28/15 1055    PT SHORT TERM GOAL #1   Title independent with initial HEP   Status Achieved           PT Long Term Goals - 09/01/15 1154    PT LONG TERM GOAL #1   Title increase AROM of the left knee to 5-115 degrees flexion   Status On-going   PT LONG TERM GOAL #2   Title decrease pain 50%   Status On-going   PT LONG TERM GOAL #3   Title walk community distances without assistive device and minimal deviation   Status Partially Met               Plan - 09/08/15 1259  Clinical Impression Statement Saw MD and is doing well, has to gain ROM prior to going back to see MD.  The good thing is the end range is a flexible type feel.   PT Next Visit Plan Gait/ Manual therapy for ROM, quad strength    MD 9/16        Problem List Patient Active Problem List   Diagnosis Date Noted  . OA (osteoarthritis) of knee 07/28/2015    Sumner Boast., PT 09/08/2015, 1:01 PM  [place] [address] [place], [place], [address] Phone: [phone]   Fax:  [phone]

## 2015-09-10 ENCOUNTER — Ambulatory Visit: Payer: Managed Care, Other (non HMO) | Admitting: Physical Therapy

## 2015-09-10 ENCOUNTER — Encounter: Payer: Self-pay | Admitting: Physical Therapy

## 2015-09-10 DIAGNOSIS — M25662 Stiffness of left knee, not elsewhere classified: Secondary | ICD-10-CM

## 2015-09-10 DIAGNOSIS — M25462 Effusion, left knee: Secondary | ICD-10-CM

## 2015-09-10 DIAGNOSIS — M25562 Pain in left knee: Secondary | ICD-10-CM | POA: Diagnosis not present

## 2015-09-10 NOTE — Therapy (Signed)
[place], [place], [address] Phone: [phone]   Fax:  [phone]  Physical Therapy Treatment  Patient Details  Name: Tim [name] MRN: [id number] Date of Birth: [birth year] Referring Provider:  Gaynelle Arabian, MD  Encounter Date: 09/10/2015      PT End of Session - 09/10/15 1142    Visit Number 8   Date for PT Re-Evaluation 10/20/15   PT Start Time 1100   PT Stop Time 1157   PT Time Calculation (min) 57 min   Activity Tolerance Patient tolerated treatment well   Behavior During Therapy [facility] for tasks assessed/performed      Past Medical History  Diagnosis Date  . Hypertension   . Arthritis     Past Surgical History  Procedure Laterality Date  . Total knee arthroplasty Left 07/28/2015    Procedure: LEFT TOTAL KNEE ARTHROPLASTY;  Surgeon: Gaynelle Arabian, MD;  Location: [facility];  Service: Orthopedics;  Laterality: Left;    There were no vitals filed for this visit.  Visit Diagnosis:  Left knee pain  Knee stiffness, left  Swelling of left knee joint      Subjective Assessment - 09/10/15 1108    Subjective Pt reports that things are going good, does have some stiffness in L knee in the mornings   Limitations Standing;Walking;House hold activities   Currently in Pain? No/denies   Pain Score 0-No pain   Pain Location Knee   Pain Orientation Right                         [facility] Adult PT Treatment/Exercise - 09/10/15 0001    Knee/Hip Exercises: Aerobic   Elliptical I10 R 5 3 fwd/3back   Knee/Hip Exercises: Machines for Strengthening   Cybex Knee Extension 10 # 3 sets 10   Cybex Knee Flexion 25# left leg only 3 sets 10 reps    Cybex Leg Press 70 # 3X10   Knee/Hip Exercises: Standing   Forward Step Up 10 reps;Step Height: 8";3 sets   Other Standing Knee Exercises Squats holding on sink 2X15    Other Standing Knee Exercises cable pull down 170 # 2 sets 15   Cryotherapy   Number Minutes Cryotherapy 15 Minutes   Cryotherapy Location Knee   Vasopneumatic   Number Minutes Vasopneumatic  15 minutes   Vasopnuematic Location  Knee   Vasopneumatic Pressure Medium   Vasopneumatic Temperature  32                  PT Short Term Goals - 08/28/15 1055    PT SHORT TERM GOAL #1   Title independent with initial HEP   Status Achieved           PT Long Term Goals - 09/01/15 1154    PT LONG TERM GOAL #1   Title increase AROM of the left knee to 5-115 degrees flexion   Status On-going   PT LONG TERM GOAL #2   Title decrease pain 50%   Status On-going   PT LONG TERM GOAL #3   Title walk community distances without assistive device and minimal deviation   Status Partially Met               Plan - 09/10/15 1143    Clinical Impression Statement Pt very motivated and gives good effort during PT session. Able to tolerated interventions with increased weight.   Pt will benefit from  skilled therapeutic intervention in order to improve on the following deficits Abnormal gait;Decreased mobility;Decreased range of motion;Difficulty walking;Decreased strength;Increased edema;Pain   Rehab Potential Good   PT Frequency 3x / week   PT Duration 8 weeks   PT Treatment/Interventions Electrical Stimulation;Cryotherapy;Gait training;Neuromuscular re-education;Therapeutic exercise;Therapeutic activities;Functional mobility training;Stair training;Patient/family education;Manual techniques   PT Next Visit Plan Gait/ Manual therapy for ROM, quad strength    MD 9/16        Problem List Patient Active Problem List   Diagnosis Date Noted  . OA (osteoarthritis) of knee 07/28/2015    Scot Jun, PTA  09/10/2015, 11:44 AM  [address] [place], [place], [address] Phone: [phone]   Fax:  [phone]

## 2015-09-12 ENCOUNTER — Ambulatory Visit: Payer: Managed Care, Other (non HMO) | Admitting: Physical Therapy

## 2015-09-12 ENCOUNTER — Encounter: Payer: Self-pay | Admitting: Physical Therapy

## 2015-09-12 DIAGNOSIS — M25562 Pain in left knee: Secondary | ICD-10-CM

## 2015-09-12 DIAGNOSIS — M25462 Effusion, left knee: Secondary | ICD-10-CM

## 2015-09-12 DIAGNOSIS — R262 Difficulty in walking, not elsewhere classified: Secondary | ICD-10-CM

## 2015-09-12 DIAGNOSIS — M25662 Stiffness of left knee, not elsewhere classified: Secondary | ICD-10-CM

## 2015-09-12 NOTE — Therapy (Signed)
[place], [place], [address] Phone: [phone]   Fax:  [phone]  Physical Therapy Treatment  Patient Details  Name: Tim [name] MRM: [id number] Date of Birth: 14-Sep-1956 Referring Provider:  Gaynelle Arabian, MD  Encounter Date: 09/12/2015      PT End of Session - 09/12/15 0904    Visit Number 9   Date for PT Re-Evaluation 10/20/15   PT Start Time 0801   PT Stop Time 0918   PT Time Calculation (min) 77 min   Behavior During Therapy [facility] for tasks assessed/performed      Past Medical History  Diagnosis Date  . Hypertension   . Arthritis     Past Surgical History  Procedure Laterality Date  . Total knee arthroplasty Left 07/28/2015    Procedure: LEFT TOTAL KNEE ARTHROPLASTY;  Surgeon: Gaynelle Arabian, MD;  Location: [facility];  Service: Orthopedics;  Laterality: Left;    There were no vitals filed for this visit.  Visit Diagnosis:  Knee stiffness, left  Swelling of left knee joint  Left knee pain  Difficulty walking      Subjective Assessment - 09/12/15 0804    Subjective Pt reports things are going good, not as sore as last time.   Currently in Pain? Yes   Pain Score 2    Pain Location Knee   Pain Orientation Right   Pain Descriptors / Indicators Tightness   Pain Onset 1 to 4 weeks ago                         [facility] Adult PT Treatment/Exercise - 09/12/15 0001    Knee/Hip Exercises: Stretches   Other Knee/Hip Stretches Lunge stretch 3 sec 10 reps    Knee/Hip Exercises: Aerobic   Elliptical I R 5 2 fwd/back   Recumbent Bike 4 min, working on full rev.   Knee/Hip Exercises: Machines for Strengthening   Cybex Knee Extension 10 # 3 sets 10   Cybex Knee Flexion 35# left leg only 3 sets 10 reps    Cybex Leg Press 80 # 5   Other Machine low load long duration stretch 10 reps on leg press    Knee/Hip Exercises: Standing   Forward Step Up 10 reps;2 sets;Step  Height: 8"   Other Standing Knee Exercises Squats holding on sink 2    Other Standing Knee Exercises cable pull down 170 # 3 sets 15   Cryotherapy   Number Minutes Cryotherapy 15 Minutes   Cryotherapy Location Knee   Type of Cryotherapy Other (comment)  Game Ready    Vaso pneumatic   Number Minutes Vaso pneumatic  15 minutes   Aspermatic Location  Knee   Vaso pneumatic Pressure Medium   Vaso pneumatic Temperature  32   Manual Therapy   Manual Therapy Passive ROM   Passive ROM CL knee flexion and extension                  PT Short Term Goals - 08/28/15 1055    PT SHORT TERM GOAL #1   Title independent with initial HEP   Status Achieved           PT Long Term Goals - 09/12/15 0909    PT LONG TERM GOAL #4   Title go up and down stairs reciprocally   Status Partially Met  Plan - 09/12/15 0905    Clinical Impression Statement Pt continues to be very motivated in PT. Pt tolerated all interventions with increased weight, but still has limited with CL knee flexion and extension. Pt reports that he has been compliant with HEP. Pt instructed to perform lunge stretch at home, verbalizes understanding.   Pt will benefit from skilled therapeutic intervention in order to improve on the following deficits Abnormal gait;Decreased mobility;Decreased range of motion;Difficulty walking;Decreased strength;Increased edema;Pain   Rehab Potential Good   PT Frequency 3 / week   PT Duration 8 weeks   PT Treatment/Interventions Electrical Stimulation;Cryotherapy;Gait training;Neuromuscular re-education;Therapeutic exercise;Therapeutic activities;Functional mobility training;Stair training;Patient/family education;Manual techniques   PT Next Visit Plan Gait/ Manual therapy for ROM, quad strength    MD 9/16        Problem List Patient Active Problem List   Diagnosis Date Noted  . OA (osteoarthritis) of knee 07/28/2015    Scot Jun, PTA  09/12/2015,  9:10 AM  [place], [place], [address] Phone: [phone]   Fax:  [phone]

## 2015-09-15 ENCOUNTER — Encounter: Payer: Self-pay | Admitting: Physical Therapy

## 2015-09-15 ENCOUNTER — Ambulatory Visit: Payer: Managed Care, Other (non HMO) | Admitting: Physical Therapy

## 2015-09-15 DIAGNOSIS — M25662 Stiffness of left knee, not elsewhere classified: Secondary | ICD-10-CM

## 2015-09-15 DIAGNOSIS — M25562 Pain in left knee: Secondary | ICD-10-CM

## 2015-09-15 DIAGNOSIS — R262 Difficulty in walking, not elsewhere classified: Secondary | ICD-10-CM

## 2015-09-15 DIAGNOSIS — M25462 Effusion, left knee: Secondary | ICD-10-CM

## 2015-09-15 NOTE — Therapy (Signed)
[place], [place], [address] Phone: [phone]   Fax:  [phone]  Physical Therapy Treatment  Patient Details  Name: Tim [name] MRN: [id number] Date of Birth: [birth year] Referring Provider:  Gaynelle Arabian, MD  Encounter Date: 09/15/2015      PT End of Session - 09/15/15 0839    Visit Number 10   Date for PT Re-Evaluation 10/20/15   PT Start Time 0802   PT Stop Time 0905   PT Time Calculation (min) 63 min   Activity Tolerance Patient tolerated treatment well   Behavior During Therapy [facility] for tasks assessed/performed      Past Medical History  Diagnosis Date  . Hypertension   . Arthritis     Past Surgical History  Procedure Laterality Date  . Total knee arthroplasty Left 07/28/2015    Procedure: LEFT TOTAL KNEE ARTHROPLASTY;  Surgeon: Gaynelle Arabian, MD;  Location: [facility];  Service: Orthopedics;  Laterality: Left;    There were no vitals filed for this visit.  Visit Diagnosis:  Knee stiffness, left  Swelling of left knee joint  Left knee pain  Difficulty walking      Subjective Assessment - 09/15/15 0803    Subjective Very stiff.    Currently in Pain? Yes   Pain Score 2    Pain Location Knee   Pain Orientation Left   Pain Descriptors / Indicators Tightness   Pain Type Surgical pain   Pain Onset 1 to 4 weeks ago   Aggravating Factors  bending the knee   Pain Relieving Factors rest                         [facility] Adult PT Treatment/Exercise - 09/15/15 0001    Ambulation/Gait   Gait Comments stairs reciprocally up and down   Knee/Hip Exercises: Stretches   Other Knee/Hip Stretches Lunge stretch 3 sec 10 reps    Other Knee/Hip Stretches prayer position stretch   Knee/Hip Exercises: Aerobic   Elliptical I15 R 5 2 fwd/2back   Knee/Hip Exercises: Machines for Strengthening   Cybex Knee Extension 10 # 3 sets 10   Cybex Knee Flexion 35# left leg only 3  sets 10 reps    Cybex Leg Press 40# just really working on flexion   Other Machine sit to stand    Knee/Hip Exercises: Standing   Other Standing Knee Exercises Squats holding on sink 2X15    Vasopneumatic   Number Minutes Vasopneumatic  15 minutes   Vasopnuematic Location  Knee   Vasopneumatic Pressure Medium   Vasopneumatic Temperature  32   Manual Therapy   Manual Therapy Passive ROM   Joint Mobilization patellar mobs grades 2-3    Soft tissue mobilization L hamstring    Passive ROM L knee flexion and extension                  PT Short Term Goals - 08/28/15 1055    PT SHORT TERM GOAL #1   Title independent with initial HEP   Status Achieved           PT Long Term Goals - 09/12/15 0909    PT LONG TERM GOAL #4   Title go up and down stairs reciprocally   Status Partially Met               Plan - 09/15/15 0839    Clinical Impression Statement Patient overall  feels that he is making progress with his ROM and ability to walk and get up from sitting.  He remains very tight however it feels like a "springy" end feel.   PT Next Visit Plan Gait/ Manual therapy for ROM, quad strength    10/07/15   Consulted and Agree with Plan of Care Patient        Problem List Patient Active Problem List   Diagnosis Date Noted  . OA (osteoarthritis) of knee 07/28/2015    ALBRIGHT,MICHAEL W.,PT 09/15/2015, 8:41 AM  [address] [place], [place], [address] Phone: [phone]   Fax:  [phone]

## 2015-09-17 ENCOUNTER — Encounter: Payer: Self-pay | Admitting: Physical Therapy

## 2015-09-17 ENCOUNTER — Ambulatory Visit: Payer: Managed Care, Other (non HMO) | Admitting: Physical Therapy

## 2015-09-17 DIAGNOSIS — R262 Difficulty in walking, not elsewhere classified: Secondary | ICD-10-CM

## 2015-09-17 DIAGNOSIS — M25662 Stiffness of left knee, not elsewhere classified: Secondary | ICD-10-CM

## 2015-09-17 DIAGNOSIS — M25562 Pain in left knee: Secondary | ICD-10-CM | POA: Diagnosis not present

## 2015-09-17 NOTE — Therapy (Signed)
[place], [place], [address] Phone: [phone]   Fax:  [phone]  Physical Therapy Treatment  Patient Details  Name: Tim [name] MRN: [id number] Date of Birth: [birth year] Referring Provider:  Gaynelle Arabian, MD  Encounter Date: 09/17/2015      PT End of Session - 09/17/15 1026    Visit Number 11   Date for PT Re-Evaluation 10/20/15   PT Start Time 0935   PT Stop Time 1040   PT Time Calculation (min) 65 min   Activity Tolerance Patient tolerated treatment well   Behavior During Therapy [facility] for tasks assessed/performed      Past Medical History  Diagnosis Date  . Hypertension   . Arthritis     Past Surgical History  Procedure Laterality Date  . Total knee arthroplasty Left 07/28/2015    Procedure: LEFT TOTAL KNEE ARTHROPLASTY;  Surgeon: Gaynelle Arabian, MD;  [facility]: [facility];  Service: Orthopedics;  Laterality: Left;    There were no vitals filed for this visit.  Visit Diagnosis:  Knee stiffness, left  Difficulty walking      Subjective Assessment - 09/17/15 0936    Subjective Pt reports that things are going good. Pt reports purchasing an elliptical and rode it for 20 minutes yesterday.   Currently in Pain? Yes   Pain Score 2   took 2 pills prior to PT                         [facility] Adult PT Treatment/Exercise - 09/17/15 0001    Ambulation/Gait   Gait Comments stairs reciprocally up and down, skip step on ascend    Knee/Hip Exercises: Stretches   Other Knee/Hip Stretches Lunge stretch 3 sec 10 reps    Knee/Hip Exercises: Aerobic   Elliptical I10 R 10 3 fwd/3back   Knee/Hip Exercises: Machines for Strengthening   Cybex Knee Extension 10 # 3 sets 10   Cybex Knee Flexion 35# left leg only 3 sets 10 reps    Cybex Leg Press 80# 3x10    Knee/Hip Exercises: Standing   Other Standing Knee Exercises Squats 2x15    Other Standing Knee Exercises cable pull down  170 # 3 sets 15   Manual Therapy   Manual Therapy Passive ROM   Manual therapy comments PROM taken to end range and held   Passive ROM R knee ext                   PT Short Term Goals - 08/28/15 1055    PT SHORT TERM GOAL #1   Title independent with initial HEP   Status Achieved           PT Long Term Goals - 09/12/15 0909    PT LONG TERM GOAL #4   Title go up and down stairs reciprocally   Status Partially Met               Plan - 09/17/15 1027    Clinical Impression Statement Pt continues to report progression at home with mobility. Pt gave great effort in PT this date. Pt does have difficulty skipping a step ascending with stair negotiation. Pt R knee remains very tight and limited with flex and ext    Pt will benefit from skilled therapeutic intervention in order to improve on the following deficits Abnormal gait;Decreased mobility;Decreased range of motion;Difficulty walking;Decreased strength;Increased edema;Pain   Rehab Potential Good  PT Frequency 3x / week   PT Duration 8 weeks   PT Treatment/Interventions Electrical Stimulation;Cryotherapy;Gait training;Neuromuscular re-education;Therapeutic exercise;Therapeutic activities;Functional mobility training;Stair training;Patient/family education;Manual techniques   PT Next Visit Plan Gait/ Manual therapy for ROM, quad strength    10/07/15        Problem List Patient Active Problem List   Diagnosis Date Noted  . OA (osteoarthritis) of knee 07/28/2015    Scot Jun, PTA  09/17/2015, 10:33 AM  [address] [place], [place], [address] Phone: [phone]   Fax:  [phone]

## 2015-09-19 ENCOUNTER — Ambulatory Visit: Payer: Managed Care, Other (non HMO) | Admitting: Physical Therapy

## 2015-09-19 DIAGNOSIS — M25562 Pain in left knee: Secondary | ICD-10-CM

## 2015-09-19 DIAGNOSIS — M25662 Stiffness of left knee, not elsewhere classified: Secondary | ICD-10-CM

## 2015-09-19 DIAGNOSIS — M25462 Effusion, left knee: Secondary | ICD-10-CM

## 2015-09-19 DIAGNOSIS — R262 Difficulty in walking, not elsewhere classified: Secondary | ICD-10-CM

## 2015-09-19 NOTE — Therapy (Signed)
[place], [place], [address] Phone: [phone]   Fax:  [phone]  Physical Therapy Treatment  Patient Details  Name: Tim [name] MRN: [id number] Date of Birth: 15-Dec-1956 Referring Provider:  Gaynelle Arabian, MD  Encounter Date: 09/19/2015      PT End of Session - 09/19/15 1031    Visit Number 12   Date for PT Re-Evaluation 10/20/15   PT Start Time 0930   PT Stop Time 1030   PT Time Calculation (min) 60 min   Activity Tolerance Patient tolerated treatment well   Behavior During Therapy [facility] for tasks assessed/performed      Past Medical History  Diagnosis Date   Hypertension    Arthritis     Past Surgical History  Procedure Laterality Date   Total knee arthroplasty Left 07/28/2015    Procedure: LEFT TOTAL KNEE ARTHROPLASTY;  Surgeon: Gaynelle Arabian, MD;  [facility];  Service: Orthopedics;  Laterality: Left;    There were no vitals filed for this visit.  Visit Diagnosis:  Knee stiffness, left  Difficulty walking  Swelling of left knee joint  Left knee pain      Subjective Assessment - 09/19/15 0938    Subjective Feeling better                         [facility] Adult PT Treatment/Exercise - 09/19/15 0001    Ambulation/Gait   Gait Comments stairs reciprocally up and down, skip step on asccend    Knee/Hip Exercises: Aerobic   Elliptical I10 R 10 3 fwd/3back   Knee/Hip Exercises: Machines for Strengthening   Cybex Leg Press 80# 3x15  30" LLPS knee flexion between sets   Knee/Hip Exercises: Standing   Forward Step Up Left;2 sets;10 reps;Step Height: 8"   Other Standing Knee Exercises squats 2x10x5sec hold   Cryotherapy   Number Minutes Cryotherapy 15 Minutes   Cryotherapy Location Knee   Type of Cryotherapy Other (comment)  game ready   Vasopneumatic   Number Minutes Vasopneumatic  15 minutes   Vasopnuematic Location  Knee   Vasopneumatic  Pressure Medium   Vasopneumatic Temperature  32   Manual Therapy   Manual Therapy Passive ROM   Manual therapy comments PROM taken to end range and held   Joint Mobilization patellar mobs grades 2-3    Soft tissue mobilization L hamstring    Passive ROM R knee ext                   PT Short Term Goals - 08/28/15 1055    PT SHORT TERM GOAL #1   Title independent with initial HEP   Status Achieved           PT Long Term Goals - 09/12/15 0909    PT LONG TERM GOAL #4   Title go up and down stairs reciprocally   Status Partially Met               Plan - 09/19/15 1040    Clinical Impression Statement Cont with decreased knee flex/ extension ROM.  Lacks hip/ knee extension synergy with gait        Problem List Patient Active Problem List   Diagnosis Date Noted   OA (osteoarthritis) of knee 07/28/2015    Olean Ree, PTA 09/19/2015, 10:49 AM  [place], [place],  [place] Phone: [phone]   Fax:  [phone]

## 2015-09-22 ENCOUNTER — Ambulatory Visit: Payer: Managed Care, Other (non HMO) | Attending: Orthopedic Surgery | Admitting: Physical Therapy

## 2015-09-22 ENCOUNTER — Encounter: Payer: Self-pay | Admitting: Physical Therapy

## 2015-09-22 DIAGNOSIS — M25662 Stiffness of left knee, not elsewhere classified: Secondary | ICD-10-CM

## 2015-09-22 DIAGNOSIS — M25462 Effusion, left knee: Secondary | ICD-10-CM

## 2015-09-22 DIAGNOSIS — M25562 Pain in left knee: Secondary | ICD-10-CM | POA: Diagnosis present

## 2015-09-22 DIAGNOSIS — R262 Difficulty in walking, not elsewhere classified: Secondary | ICD-10-CM | POA: Insufficient documentation

## 2015-09-22 NOTE — Therapy (Signed)
[place], [place], [address] Phone: [phone]   Fax:  [phone]  Physical Therapy Treatment  Patient Details  Name: Tim [name] MRN: [id number] Date of Birth: 12-20-1956 Referring Provider:  Gaynelle Arabian, MD  Encounter Date: 09/22/2015      PT End of Session - 09/22/15 1145    Visit Number 13   Date for PT Re-Evaluation 10/20/15   PT Start Time 1102   PT Stop Time 1158   PT Time Calculation (min) 56 min      Past Medical History  Diagnosis Date  . Hypertension   . Arthritis     Past Surgical History  Procedure Laterality Date  . Total knee arthroplasty Left 07/28/2015    Procedure: LEFT TOTAL KNEE ARTHROPLASTY;  Surgeon: Gaynelle Arabian, MD;  [facility];  Service: Orthopedics;  Laterality: Left;    There were no vitals filed for this visit.  Visit Diagnosis:  Knee stiffness, left  Swelling of left knee joint  Left knee pain      Subjective Assessment - 09/22/15 1104    Subjective Feeling good, still stiff, i can do a little more    Currently in Pain? Yes   Pain Score 2    Pain Location Knee   Pain Orientation Left   Pain Descriptors / Indicators Tightness   Pain Type Surgical pain                         [facility] Adult PT Treatment/Exercise - 09/22/15 0001    Ambulation/Gait   Gait Comments stairs reciprocally up and down, skip step on ascend    Knee/Hip Exercises: Aerobic   Elliptical I10 R 11 3 fwd/3back   Knee/Hip Exercises: Machines for Strengthening   Cybex Leg Press 80# 3x15   Knee/Hip Exercises: Standing   Forward Step Up Left;2 sets;10 reps;Step Height: 8";Both   Other Standing Knee Exercises squats 2x15x5sec hold   Other Standing Knee Exercises cable pull down 170 # 2 sets 15   Cryotherapy   Number Minutes Cryotherapy 15 Minutes   Cryotherapy Location Knee   Type of Cryotherapy Other (comment)  Game Ready    Vasopneumatic   Number Minutes Vasopneumatic  15 minutes   Vasopnuematic Location  Knee   Vasopneumatic Pressure Medium   Vasopneumatic Temperature  32   Manual Therapy   Manual Therapy Passive ROM   Manual therapy comments PROM taken to end range and held   Joint Mobilization patellar mobs grades 2-3    Soft tissue mobilization L hamstring    Passive ROM R knee ext                   PT Short Term Goals - 08/28/15 1055    PT SHORT TERM GOAL #1   Title independent with initial HEP   Status Achieved           PT Long Term Goals - 09/12/15 0909    PT LONG TERM GOAL #4   Title go up and down stairs reciprocally   Status Partially Met               Plan - 09/22/15 1145    Clinical Impression Statement Pt continues to work hard in PT. Continues with decrease knee flex/ ext.    Pt will benefit from skilled therapeutic intervention in order to improve on the following deficits Abnormal gait;Decreased mobility;Decreased range of  motion;Difficulty walking;Decreased strength;Increased edema;Pain   Rehab Potential Good   PT Frequency 3x / week   PT Duration 8 weeks   PT Treatment/Interventions Electrical Stimulation;Cryotherapy;Gait training;Neuromuscular re-education;Therapeutic exercise;Therapeutic activities;Functional mobility training;Stair training;Patient/family education;Manual techniques        Problem List Patient Active Problem List   Diagnosis Date Noted  . OA (osteoarthritis) of knee 07/28/2015    Scot Jun, PTA  09/22/2015, 11:47 AM  [address] [place], [place], [address] Phone: [phone]   Fax:  [phone]

## 2015-09-24 ENCOUNTER — Ambulatory Visit: Payer: Managed Care, Other (non HMO) | Admitting: Physical Therapy

## 2015-09-24 ENCOUNTER — Encounter: Payer: Self-pay | Admitting: Physical Therapy

## 2015-09-24 DIAGNOSIS — M25662 Stiffness of left knee, not elsewhere classified: Secondary | ICD-10-CM

## 2015-09-24 DIAGNOSIS — M25562 Pain in left knee: Secondary | ICD-10-CM

## 2015-09-24 DIAGNOSIS — M25462 Effusion, left knee: Secondary | ICD-10-CM

## 2015-09-24 NOTE — Therapy (Signed)
[place], [place], [address] Phone: [phone]   Fax:  [phone]  Physical Therapy Treatment  Patient Details  Name: Arcadio Cope MRN: [id number] Date of Birth: 1956-07-14 Referring Provider:  Gaynelle Arabian, MD  Encounter Date: 09/24/2015      PT End of Session - 09/24/15 1103    Visit Number 14   Date for PT Re-Evaluation 10/20/15   PT Start Time 1017   PT Stop Time 1118   PT Time Calculation (min) 61 min   Activity Tolerance Patient tolerated treatment well   Behavior During Therapy [facility] for tasks assessed/performed      Past Medical History  Diagnosis Date  . Hypertension   . Arthritis     Past Surgical History  Procedure Laterality Date  . Total knee arthroplasty Left 07/28/2015    Procedure: LEFT TOTAL KNEE ARTHROPLASTY;  Surgeon: Gaynelle Arabian, MD;  [facility]: [facility];  Service: Orthopedics;  Laterality: Left;    There were no vitals filed for this visit.  Visit Diagnosis:  Knee stiffness, left  Swelling of left knee joint  Left knee pain      Subjective Assessment - 09/24/15 1021    Subjective Pt reports no new issue    Currently in Pain? Yes   Pain Score 2    Pain Location Knee   Pain Orientation Left   Pain Descriptors / Indicators Tightness            [facility] PT Assessment - 09/24/15 0001    AROM   Left Knee Extension 14   Left Knee Flexion 90   PROM   Overall PROM Comments PROM 10-104 degrees flexion                     [facility] Adult PT Treatment/Exercise - 09/24/15 0001    Ambulation/Gait   Gait Comments stairs reciprocally up and down, skip step on ascend    Knee/Hip Exercises: Aerobic   Elliptical I15 R 6 3 fwd/3back   Knee/Hip Exercises: Machines for Strengthening   Cybex Knee Extension 10 # 3 sets 10   Cybex Knee Flexion 35# left leg only 2 sets 15 reps    Cybex Leg Press 100# 3x10, with flexion stretch    Knee/Hip Exercises:  Standing   Forward Step Up Left;2 sets;10 reps;Step Height: 8";Both;Hand Hold: 1  explosive    Other Standing Knee Exercises squats 2x10x5sec hold   Cryotherapy   Number Minutes Cryotherapy 15 Minutes   Cryotherapy Location Knee   Type of Cryotherapy Other (comment)   Vasopneumatic   Number Minutes Vasopneumatic  15 minutes   Vasopnuematic Location  Knee   Vasopneumatic Pressure High   Vasopneumatic Temperature  32   Manual Therapy   Manual Therapy Passive ROM   Manual therapy comments PROM taken to end range and held   Joint Mobilization patellar mobs grades 2-3    Soft tissue mobilization L hamstring    Passive ROM R knee ext                   PT Short Term Goals - 08/28/15 1055    PT SHORT TERM GOAL #1   Title independent with initial HEP   Status Achieved           PT Long Term Goals - 09/24/15 1105    PT LONG TERM GOAL #1   Title increase AROM of the left knee to 5-115  degrees flexion   Status On-going   PT LONG TERM GOAL #2   Title decrease pain 50%   Status Partially Met               Plan - 09/24/15 1104    Clinical Impression Statement Pt with small improvements with R knee AROM & PROM, performed all exercises well. R knee remains tight.   Pt will benefit from skilled therapeutic intervention in order to improve on the following deficits Abnormal gait;Decreased mobility;Decreased range of motion;Difficulty walking;Decreased strength;Increased edema;Pain   Rehab Potential Good   PT Frequency 3x / week   PT Duration 8 weeks   PT Treatment/Interventions Electrical Stimulation;Cryotherapy;Gait training;Neuromuscular re-education;Therapeutic exercise;Therapeutic activities;Functional mobility training;Stair training;Patient/family education;Manual techniques   PT Next Visit Plan Gait/ Manual therapy for ROM, quad strength    10/07/15        Problem List Patient Active Problem List   Diagnosis Date Noted  . OA (osteoarthritis) of knee  07/28/2015    Scot Jun, PTA  09/24/2015, 11:05 AM  [place], [place], [address] Phone: [phone]   Fax:  [phone]

## 2015-09-26 ENCOUNTER — Encounter: Payer: Self-pay | Admitting: Physical Therapy

## 2015-09-26 ENCOUNTER — Ambulatory Visit: Payer: Managed Care, Other (non HMO) | Admitting: Physical Therapy

## 2015-09-26 DIAGNOSIS — M25462 Effusion, left knee: Secondary | ICD-10-CM

## 2015-09-26 DIAGNOSIS — M25562 Pain in left knee: Secondary | ICD-10-CM

## 2015-09-26 DIAGNOSIS — M25662 Stiffness of left knee, not elsewhere classified: Secondary | ICD-10-CM | POA: Diagnosis not present

## 2015-09-26 NOTE — Therapy (Signed)
[place], [place], [address] Phone: [phone]   Fax:  [phone]  Physical Therapy Treatment  Patient Details  Name: Tim [name] MRN: [id number] Date of Birth: 09/18/1956 Referring Provider:  Gaynelle Arabian, MD  Encounter Date: 09/26/2015      PT End of Session - 09/26/15 1148    Visit Number 15   Date for PT Re-Evaluation 10/20/15   PT Start Time 1100   PT Stop Time 1201   PT Time Calculation (min) 61 min   Activity Tolerance Patient tolerated treatment well   Behavior During Therapy [facility] for tasks assessed/performed      Past Medical History  Diagnosis Date  . Hypertension   . Arthritis     Past Surgical History  Procedure Laterality Date  . Total knee arthroplasty Left 07/28/2015    Procedure: LEFT TOTAL KNEE ARTHROPLASTY;  Surgeon: Gaynelle Arabian, MD;  [facility];  Service: Orthopedics;  Laterality: Left;    There were no vitals filed for this visit.  Visit Diagnosis:  Knee stiffness, left  Swelling of left knee joint  Left knee pain      Subjective Assessment - 09/26/15 1100    Subjective Pt reports no new issue    Currently in Pain? Yes   Pain Score 2    Pain Location Knee   Pain Orientation Left   Pain Descriptors / Indicators Tightness   Pain Type Surgical pain                         [facility] Adult PT Treatment/Exercise - 09/26/15 0001    Ambulation/Gait   Gait Comments stairs reciprocally up and down x3 , 3rd set skip step on ascend    Knee/Hip Exercises: Stretches   Other Knee/Hip Stretches Lunge stretch 3 sec 10 reps    Knee/Hip Exercises: Aerobic   Elliptical I15 R 6 15 64fwd/3back   Recumbent Bike 3 min post, able to make full rev.   Knee/Hip Exercises: Machines for Strengthening   Cybex Knee Extension 10 # 3 sets 10   Cybex Knee Flexion 35# left leg only 2 sets 15 reps    Cybex Leg Press 100# 3x10, with flexion stretch    Knee/Hip  Exercises: Standing   Forward Step Up Left;10 reps;Step Height: 8";Both;Hand Hold: 1;3 sets  explosive    Other Standing Knee Exercises squats 2x10x5sec hold   Other Standing Knee Exercises cable pull down 190 # 2 sets 15   Cryotherapy   Number Minutes Cryotherapy 15 Minutes   Cryotherapy Location Knee   Type of Cryotherapy Other (comment)   Vasopneumatic   Number Minutes Vasopneumatic  15 minutes   Vasopnuematic Location  Knee   Vasopneumatic Pressure High   Vasopneumatic Temperature  32   Manual Therapy   Manual Therapy Passive ROM   Manual therapy comments PROM taken to end range and held   Passive ROM R knee flexion  & ext                   PT Short Term Goals - 08/28/15 1055    PT SHORT TERM GOAL #1   Title independent with initial HEP   Status Achieved           PT Long Term Goals - 09/24/15 1105    PT LONG TERM GOAL #1   Title increase AROM of the left knee to 5-115 degrees flexion  Status On-going   PT LONG TERM GOAL #2   Title decrease pain 50%   Status Partially Met               Plan - 09/26/15 1150    Clinical Impression Statement Pt performed all exercises well and continues to give good effort with all interventions. Today therapy session consisted of additional interventions with more resistance and weight. Pt able to make full revolution on stationary bike without extreme L hip hike.   Pt will benefit from skilled therapeutic intervention in order to improve on the following deficits Abnormal gait;Decreased mobility;Decreased range of motion;Difficulty walking;Decreased strength;Increased edema;Pain   Rehab Potential Good   PT Frequency 3x / week   PT Duration 8 weeks   PT Treatment/Interventions Electrical Stimulation;Cryotherapy;Gait training;Neuromuscular re-education;Therapeutic exercise;Therapeutic activities;Functional mobility training;Stair training;Patient/family education;Manual techniques   PT Next Visit Plan Gait/ Manual  therapy for ROM, quad strength    10/07/15        Problem List Patient Active Problem List   Diagnosis Date Noted  . OA (osteoarthritis) of knee 07/28/2015    Scot Jun, PTA  09/26/2015, 11:56 AM  [place] [address] [place], [place], [address] Phone: [phone]   Fax:  [phone]

## 2015-09-29 ENCOUNTER — Encounter: Payer: Self-pay | Admitting: Physical Therapy

## 2015-09-29 ENCOUNTER — Ambulatory Visit: Payer: Managed Care, Other (non HMO) | Admitting: Physical Therapy

## 2015-09-29 DIAGNOSIS — M25562 Pain in left knee: Secondary | ICD-10-CM

## 2015-09-29 DIAGNOSIS — M25662 Stiffness of left knee, not elsewhere classified: Secondary | ICD-10-CM | POA: Diagnosis not present

## 2015-09-29 DIAGNOSIS — M25462 Effusion, left knee: Secondary | ICD-10-CM

## 2015-09-29 NOTE — Therapy (Signed)
[place], [place], [address] Phone: [phone]   Fax:  [phone]  Physical Therapy Treatment  Patient Details  Name: Tim [name] MRN: [id number] Date of Birth: [birth year] Referring Provider:  Gaynelle Arabian, MD  Encounter Date: 09/29/2015      PT End of Session - 09/29/15 1152    Visit Number 16   Date for PT Re-Evaluation 10/20/15   PT Start Time 1100   PT Stop Time 1206   PT Time Calculation (min) 66 min   Activity Tolerance Patient tolerated treatment well   Behavior During Therapy [facility] for tasks assessed/performed      Past Medical History  Diagnosis Date  . Hypertension   . Arthritis     Past Surgical History  Procedure Laterality Date  . Total knee arthroplasty Left 07/28/2015    Procedure: LEFT TOTAL KNEE ARTHROPLASTY;  Surgeon: Gaynelle Arabian, MD;  [facility];  Service: Orthopedics;  Laterality: Left;    There were no vitals filed for this visit.  Visit Diagnosis:  Knee stiffness, left  Swelling of left knee joint  Left knee pain      Subjective Assessment - 09/29/15 1100    Subjective Pt reports that things are going good.   Currently in Pain? Yes   Pain Score 2    Pain Location Knee   Pain Orientation Left   Pain Descriptors / Indicators Tightness   Pain Type Surgical pain                         [facility] Adult PT Treatment/Exercise - 09/29/15 0001    Ambulation/Gait   Gait Comments stairs reciprocally up and down x3 , 3rd set skip step on ascend    Knee/Hip Exercises: Stretches   Other Knee/Hip Stretches Lunge stretch 3 sec 10 reps    Knee/Hip Exercises: Aerobic   Elliptical I 20 R10 62fd/3back   Recumbent Bike 3 min post, able to make full rev.   Knee/Hip Exercises: Machines for Strengthening   Cybex Knee Extension 15 # 2 sets RLE  10   Cybex Knee Flexion 35# left leg only 2 sets 15 reps    Cybex Leg Press 100# 3x10, with flexion  stretch    Knee/Hip Exercises: Standing   Forward Step Up Left;10 reps;Step Height: 8";Both;Hand Hold: 1;3 sets  Explosive    Other Standing Knee Exercises squats 2x10x5sec hold   Other Standing Knee Exercises cable pull down 190 # 2 sets 15   Cryotherapy   Number Minutes Cryotherapy 15 Minutes   Cryotherapy Location Knee   Type of Cryotherapy --  game ready   Vasopneumatic   Number Minutes Vasopneumatic  15 minutes   Vasopnuematic Location  Knee   Vasopneumatic Pressure High   Vasopneumatic Temperature  32   Manual Therapy   Manual Therapy Passive ROM   Manual therapy comments PROM taken to end range and held   Passive ROM R knee flexion  & ext                   PT Short Term Goals - 08/28/15 1055    PT SHORT TERM GOAL #1   Title independent with initial HEP   Status Achieved           PT Long Term Goals - 09/24/15 1105    PT LONG TERM GOAL #1   Title increase AROM of the left knee  to 5-115 degrees flexion   Status On-going   PT LONG TERM GOAL #2   Title decrease pain 50%   Status Partially Met               Plan - 09/29/15 1153    Clinical Impression Statement Continues to preform well in PT, Pt reports that he does the elliptical on days he's not in PT. L knee ROM still very limited but pt is very pleased of his progress.     Pt will benefit from skilled therapeutic intervention in order to improve on the following deficits Abnormal gait;Decreased mobility;Decreased range of motion;Difficulty walking;Decreased strength;Increased edema;Pain   Rehab Potential Good   PT Frequency 3x / week   PT Duration 8 weeks   PT Treatment/Interventions Electrical Stimulation;Cryotherapy;Gait training;Neuromuscular re-education;Therapeutic exercise;Therapeutic activities;Functional mobility training;Stair training;Patient/family education;Manual techniques   PT Next Visit Plan Gait/ Manual therapy for ROM, quad strength    10/07/15        Problem  List Patient Active Problem List   Diagnosis Date Noted  . OA (osteoarthritis) of knee 07/28/2015    Scot Jun, PTA  09/29/2015, 11:59 AM  [place] [address] [place], [place], [address] Phone: [phone]   Fax:  [phone]

## 2015-10-01 ENCOUNTER — Ambulatory Visit: Payer: Managed Care, Other (non HMO) | Admitting: Physical Therapy

## 2015-10-01 ENCOUNTER — Encounter: Payer: Self-pay | Admitting: Physical Therapy

## 2015-10-01 DIAGNOSIS — R262 Difficulty in walking, not elsewhere classified: Secondary | ICD-10-CM

## 2015-10-01 DIAGNOSIS — M25662 Stiffness of left knee, not elsewhere classified: Secondary | ICD-10-CM

## 2015-10-01 DIAGNOSIS — M25462 Effusion, left knee: Secondary | ICD-10-CM

## 2015-10-01 DIAGNOSIS — M25562 Pain in left knee: Secondary | ICD-10-CM

## 2015-10-01 NOTE — Therapy (Signed)
[place], [place], [address] Phone: [phone]   Fax:  [phone]  Physical Therapy Treatment  Patient Details  Name: Tim [name] MRN: [id number] Date of Birth: [birth year] Referring Provider:  Gaynelle Arabian, MD  Encounter Date: 10/01/2015      PT End of Session - 10/01/15 1152    Visit Number 17   Date for PT Re-Evaluation 10/20/15   PT Start Time 1100   PT Stop Time 1207   PT Time Calculation (min) 67 min   Activity Tolerance Patient tolerated treatment well   Behavior During Therapy [facility] for tasks assessed/performed      Past Medical History  Diagnosis Date  . Hypertension   . Arthritis     Past Surgical History  Procedure Laterality Date  . Total knee arthroplasty Left 07/28/2015    Procedure: LEFT TOTAL KNEE ARTHROPLASTY;  Surgeon: Gaynelle Arabian, MD;  [facility];  Service: Orthopedics;  Laterality: Left;    There were no vitals filed for this visit.  Visit Diagnosis:  Swelling of left knee joint  Knee stiffness, left  Left knee pain  Difficulty walking      Subjective Assessment - 10/01/15 1106    Subjective Reports increased stiffness this morning   Patient Stated Goals get my motions back, go back to work   Currently in Pain? Yes   Pain Score 2    Pain Location Knee   Pain Orientation Left                         [facility] Adult PT Treatment/Exercise - 10/01/15 0001    Knee/Hip Exercises: Stretches   Other Knee/Hip Stretches Lunge stretch 3 sec 10 reps 2 set    Knee/Hip Exercises: Aerobic   Elliptical I 20 R10 24fwd/3back   Recumbent Bike 4 min  able to make full rev.   Knee/Hip Exercises: Machines for Strengthening   Cybex Knee Extension 15 # 2 sets RLE  10   Cybex Knee Flexion 35# left leg only 2 sets 15 reps    Cybex Leg Press 100# 3x10, with flexion stretch    Knee/Hip Exercises: Standing   Forward Step Up Left;10 reps;Step Height:  8";Both;Hand Hold: 1;3 sets  explosive    Other Standing Knee Exercises squats 2x10x 3 sec hold   Other Standing Knee Exercises cable pull down 190 # 2 sets 15   Knee/Hip Exercises: Seated   Sit to Sand 2 sets;10 reps;without UE support  heavy use of momentum    Cryotherapy   Number Minutes Cryotherapy 15 Minutes   Cryotherapy Location Knee   Vasopneumatic   Number Minutes Vasopneumatic  15 minutes   Vasopnuematic Location  Knee   Vasopneumatic Pressure High   Vasopneumatic Temperature  32   Manual Therapy   Manual Therapy Passive ROM   Manual therapy comments PROM taken to end range and held   Passive ROM R knee flexion  & ext                   PT Short Term Goals - 08/28/15 1055    PT SHORT TERM GOAL #1   Title independent with initial HEP   Status Achieved           PT Long Term Goals - 09/24/15 1105    PT LONG TERM GOAL #1   Title increase AROM of the left knee to 5-115 degrees flexion  Status On-going   PT LONG TERM GOAL #2   Title decrease pain 50%   Status Partially Met               Plan - 10/01/15 1152    Clinical Impression Statement Gives max effort with all interventions, L knee still remain tight. Pt reports able to perform yard work yesterday.   Pt will benefit from skilled therapeutic intervention in order to improve on the following deficits Abnormal gait;Decreased mobility;Decreased range of motion;Difficulty walking;Decreased strength;Increased edema;Pain   Rehab Potential Good   PT Frequency 3x / week   PT Duration 8 weeks   PT Treatment/Interventions Electrical Stimulation;Cryotherapy;Gait training;Neuromuscular re-education;Therapeutic exercise;Therapeutic activities;Functional mobility training;Stair training;Patient/family education;Manual techniques   PT Next Visit Plan Gait/ Manual therapy for ROM, quad strength    10/07/15        Problem List Patient Active Problem List   Diagnosis Date Noted  . OA (osteoarthritis)  of knee 07/28/2015    Scot Jun, PTA  10/01/2015, 11:54 AM  [place] [address] [place], [place], [address] Phone: [phone]   Fax:  [phone]

## 2015-10-03 ENCOUNTER — Ambulatory Visit: Payer: Managed Care, Other (non HMO) | Admitting: Physical Therapy

## 2015-10-03 ENCOUNTER — Encounter: Payer: Self-pay | Admitting: Physical Therapy

## 2015-10-03 DIAGNOSIS — M25662 Stiffness of left knee, not elsewhere classified: Secondary | ICD-10-CM

## 2015-10-03 DIAGNOSIS — M25562 Pain in left knee: Secondary | ICD-10-CM

## 2015-10-03 DIAGNOSIS — M25462 Effusion, left knee: Secondary | ICD-10-CM

## 2015-10-03 NOTE — Therapy (Signed)
[place], [place], [address] Phone: [phone]   Fax:  [phone]  Physical Therapy Treatment  Patient Details  Name: Tim [name] MRN: [id number] Date of Birth: April 12, 1956 No Data Recorded  Encounter Date: 10/03/2015      PT End of Session - 10/03/15 1152    Visit Number 18   Date for PT Re-Evaluation 10/20/15   PT Start Time 1100   PT Stop Time 1200   PT Time Calculation (min) 60 min   Activity Tolerance Patient tolerated treatment well   Behavior During Therapy [facility] for tasks assessed/performed      Past Medical History  Diagnosis Date  . Hypertension   . Arthritis     Past Surgical History  Procedure Laterality Date  . Total knee arthroplasty Left 07/28/2015    Procedure: LEFT TOTAL KNEE ARTHROPLASTY;  Surgeon: Gaynelle Arabian, MD;  [facility];  Service: Orthopedics;  Laterality: Left;    There were no vitals filed for this visit.  Visit Diagnosis:  Left knee pain  Knee stiffness, left  Swelling of left knee joint      Subjective Assessment - 10/03/15 1103    Subjective Just a little tight when I get started   Currently in Pain? Yes   Pain Score 2    Pain Orientation Left   Pain Descriptors / Indicators Tightness   Pain Type Surgical pain                         [facility] Adult PT Treatment/Exercise - 10/03/15 0001    Knee/Hip Exercises: Aerobic   Elliptical I 20 R10 51fd/3back   Recumbent Bike 3 min  able to make full rev.   Knee/Hip Exercises: Machines for Strengthening   Cybex Knee Extension 15 # 2 sets RLE  10   Cybex Knee Flexion 35# left leg only 2 sets 15 reps    Cybex Leg Press 80# 3x10, with flexion stretch    Knee/Hip Exercises: Standing   Forward Step Up Left;10 reps;Step Height: 8";Both;Hand Hold: 1;3 sets  Explosive, Black tband LLE focusing on TKE    Other Standing Knee Exercises squats 2x10x 3 sec hold   Knee/Hip Exercises:  Seated   Sit to Sand 2 sets;10 reps;without UE support  heavy use of momentum, anterior pull on L knee on descends    Cryotherapy   Number Minutes Cryotherapy 15 Minutes   Cryotherapy Location Knee   Vasopneumatic   Number Minutes Vasopneumatic  15 minutes   Vasopnuematic Location  Knee   Vasopneumatic Pressure High   Vasopneumatic Temperature  32   Manual Therapy   Manual Therapy Passive ROM;Soft tissue mobilization   Manual therapy comments PROM taken to end range and held   Soft tissue mobilization R quad   Passive ROM R knee flexion  & ext                   PT Short Term Goals - 08/28/15 1055    PT SHORT TERM GOAL #1   Title independent with initial HEP   Status Achieved           PT Long Term Goals - 09/24/15 1105    PT LONG TERM GOAL #1   Title increase AROM of the left knee to 5-115 degrees flexion   Status On-going   PT LONG TERM GOAL #2   Title decrease pain 50%  Status Partially Met               Plan - 10/03/15 1154    Clinical Impression Statement Continues to give max effort in PT. L knee remains Tight, Positive response to STM to R quad.   Pt will benefit from skilled therapeutic intervention in order to improve on the following deficits Abnormal gait;Decreased mobility;Decreased range of motion;Difficulty walking;Decreased strength;Increased edema;Pain   Rehab Potential Good   PT Frequency 3x / week   PT Duration 8 weeks   PT Treatment/Interventions Electrical Stimulation;Cryotherapy;Gait training;Neuromuscular re-education;Therapeutic exercise;Therapeutic activities;Functional mobility training;Stair training;Patient/family education;Manual techniques   PT Next Visit Plan Gait/ Manual therapy for ROM, quad strength    10/07/15        Problem List Patient Active Problem List   Diagnosis Date Noted  . OA (osteoarthritis) of knee 07/28/2015    Scot Jun, PTA 10/03/2015, 11:56 AM  [place], [place], [address] Phone: [phone]   Fax:  [phone]  Name: Tim [name] MRN: [id number] Date of Birth: Jan 22, 1956

## 2015-10-06 ENCOUNTER — Ambulatory Visit: Payer: Managed Care, Other (non HMO) | Admitting: Physical Therapy

## 2015-10-06 ENCOUNTER — Encounter: Payer: Self-pay | Admitting: Physical Therapy

## 2015-10-06 DIAGNOSIS — M25462 Effusion, left knee: Secondary | ICD-10-CM

## 2015-10-06 DIAGNOSIS — R262 Difficulty in walking, not elsewhere classified: Secondary | ICD-10-CM

## 2015-10-06 DIAGNOSIS — M25662 Stiffness of left knee, not elsewhere classified: Secondary | ICD-10-CM | POA: Diagnosis not present

## 2015-10-06 DIAGNOSIS — M25562 Pain in left knee: Secondary | ICD-10-CM

## 2015-10-06 NOTE — Therapy (Signed)
[facility] [address] [place], [place], [address] Phone: [phone]   Fax:  [phone]  Physical Therapy Treatment  Patient Details  Name: Tim [name] MRN: [id number] Date of Birth: 08-Mar-1956 No Data Recorded  Encounter Date: 10/06/2015      PT End of Session - 10/06/15 1102    Visit Number 19   Date for PT Re-Evaluation 10/20/15   PT Start Time 1015   PT Stop Time 1119   PT Time Calculation (min) 64 min   Activity Tolerance Patient tolerated treatment well   Behavior During Therapy [facility] for tasks assessed/performed      Past Medical History  Diagnosis Date  . Hypertension   . Arthritis     Past Surgical History  Procedure Laterality Date  . Total knee arthroplasty Left 07/28/2015    Procedure: LEFT TOTAL KNEE ARTHROPLASTY;  Surgeon: Ollen GrossFrank Aluisio, MD;  [facility];  Service: Orthopedics;  Laterality: Left;    There were no vitals filed for this visit.  Visit Diagnosis:  Knee stiffness, left  Swelling of left knee joint  Difficulty walking  Left knee pain      Subjective Assessment - 10/06/15 1019    Subjective Just stays tight   Currently in Pain? Yes   Pain Score 2    Pain Location Knee   Pain Orientation Left            [facility] PT Assessment - 10/06/15 0001    AROM   Left Knee Extension 14   Left Knee Flexion 97   PROM   Overall PROM Comments 10-107                     [facility] Adult PT Treatment/Exercise - 10/06/15 0001    Knee/Hip Exercises: Stretches   Other Knee/Hip Stretches Lunge stretch 3 sec 10 reps 2 set    Knee/Hip Exercises: Aerobic   Elliptical I 20 R10 533fwd/3back   Other Aerobic UBE constant work at 45 watts x 4 minutes for endurance   Knee/Hip Exercises: Machines for Strengthening   Cybex Knee Extension 15 # 2 sets RLE  10   Cybex Knee Flexion 35# left leg only 2 sets 15 reps    Cybex Leg Press 80# 3x10, with flexion stretch    Knee/Hip  Exercises: Standing   Other Standing Knee Exercises squats 2x10x 3 sec hold   Vasopneumatic   Number Minutes Vasopneumatic  15 minutes   Vasopnuematic Location  Knee   Vasopneumatic Pressure High   Vasopneumatic Temperature  32   Manual Therapy   Manual Therapy Passive ROM;Soft tissue mobilization   Manual therapy comments PROM taken to end range and held   Passive ROM some contract relax                  PT Short Term Goals - 08/28/15 1055    PT SHORT TERM GOAL #1   Title independent with initial HEP   Status Achieved           PT Long Term Goals - 10/06/15 1104    PT LONG TERM GOAL #1   Title increase AROM of the left knee to 5-115 degrees flexion   Status On-going   PT LONG TERM GOAL #2   Title decrease pain 50%   Status On-going               Plan - 10/06/15 1102    Clinical Impression  Statement Patient remains very tight however the end feel with PROM is a "springy" feel that makes me think that the ROM may improve with continued stretching.   PT Next Visit Plan Gait/ Manual therapy for ROM, quad strength       Consulted and Agree with Plan of Care Patient        Problem List Patient Active Problem List   Diagnosis Date Noted  . OA (osteoarthritis) of knee 07/28/2015    Jearld Lesch 10/06/2015, 11:07 AM  [facility] [place] [address] [place], [place], [address] Phone: [phone]   Fax:  [phone]  Name: Tim [name] MRN: [id number] Date of Birth: August 01, 1956

## 2015-10-07 ENCOUNTER — Ambulatory Visit: Payer: Self-pay | Admitting: Orthopedic Surgery

## 2015-10-07 ENCOUNTER — Telehealth: Payer: Self-pay

## 2015-10-07 NOTE — Telephone Encounter (Signed)
Patient cancelled PT appt 10/19, having manipulation next Tues or thurs

## 2015-10-07 NOTE — Progress Notes (Signed)
Preoperative surgical orders have been place into the Epic hospital system for Tim [name] on 10/07/2015, 5:00 PM  by Patrica DuelPERKINS, Teandre Hamre for surgery on 10-13-2015.  Preop Knee orders including IV Tylenol and IV Decadron as long as there are no contraindications to the above medications. Avel Peacerew Keira Bohlin, PA-C

## 2015-10-08 ENCOUNTER — Ambulatory Visit: Payer: Managed Care, Other (non HMO) | Admitting: Physical Therapy

## 2015-10-09 NOTE — Patient Instructions (Addendum)
20 Tim [name]  10/09/2015   Your procedure is scheduled on:   10-13-2015 Monday  [facility] and [facility]. Arrive at      2:30  PM.  (Limit 1 person with you).  Call this number if you have problems the morning of surgery: [phone]  Or Presurgical Testing [phone].   For Living Will and/or Health Care Power Attorney Forms: please provide copy for your medical record,may bring AM of surgery(Forms should be already notarized -we do not provide this service).(10-10-15 Yes/ Not   Preferring to provide at this time).     Do not eat food/ or drink: After Midnight.  Exception: may have clear liquids:up to 6 Hours before arrival. Nothing after: 1100 AM  Clear liquids include soda, tea, black coffee, apple or grape juice, broth.  Take these medicines the morning of surgery with A SIP OF WATER-   (DO NOT TAKE ANY DIABETIC MEDS AM OF SURGERY) : Amlodipine. Atorvastatin.   Do not wear jewelry, make-up or nail polish.  Do not wear deodorant, lotions, powders, or perfumes.   Do not shave legs and under arms- 48 hours(2 days) prior to first CHG shower.(Shaving face and neck okay.)  Do not bring valuables to the hospital.(Hospital is not responsible for lost valuables).  Contacts, dentures or removable bridgework, body piercing, hair pins may not be worn into surgery.  Leave suitcase in the car. After surgery it may be brought to your room.  For patients admitted to the hospital, checkout time is 11:00 AM the day of discharge.(Restricted visitors-Any Persons displaying flu-like symptoms or illness).    Patients discharged the day of surgery will not be allowed to drive home. Must have responsible person with you x 24 hours once discharged.  Name and phone number of your driver: Tim ConradiJanice [name] [phone] cell     Please read over the following fact sheets that you were given:  CHG(Chlorhexidine Gluconate 4% Surgical Soap) use,  Incentive  Spirometry Instruction.           [place] - Preparing for Surgery Before surgery, you can play an important role.  Because skin is not sterile, your skin needs to be as free of germs as possible.  You can reduce the number of germs on your skin by washing with CHG (chlorahexidine gluconate) soap before surgery.  CHG is an antiseptic cleaner which kills germs and bonds with the skin to continue killing germs even after washing. Please DO NOT use if you have an allergy to CHG or antibacterial soaps.  If your skin becomes reddened/irritated stop using the CHG and inform your nurse when you arrive at Short Stay. Do not shave (including legs and underarms) for at least 48 hours prior to the first CHG shower.  You may shave your face/neck. Please follow these instructions carefully:  1.  Shower with CHG Soap the night before surgery and the  morning of Surgery.  2.  If you choose to wash your hair, wash your hair first as usual with your  normal  shampoo.  3.  After you shampoo, rinse your hair and body thoroughly to remove the  shampoo.                           4.  Use CHG as you would any other liquid soap.  You can apply chg directly  to the skin and wash  Gently with a scrungie or clean washcloth.  5.  Apply the CHG Soap to your body ONLY FROM THE NECK DOWN.   Do not use on face/ open                           Wound or open sores. Avoid contact with eyes, ears mouth and genitals (private parts).                       Wash face,  Genitals (private parts) with your normal soap.             6.  Wash thoroughly, paying special attention to the area where your surgery  will be performed.  7.  Thoroughly rinse your body with warm water from the neck down.  8.  DO NOT shower/wash with your normal soap after using and rinsing off  the CHG Soap.                9.  Pat yourself dry with a clean towel.            10.  Wear clean pajamas.            11.  Place clean sheets on your  bed the night of your first shower and do not  sleep with pets. Day of Surgery : Do not apply any lotions/deodorants the morning of surgery.  Please wear clean clothes to the hospital/surgery center.  FAILURE TO FOLLOW THESE INSTRUCTIONS MAY RESULT IN THE CANCELLATION OF YOUR SURGERY PATIENT SIGNATURE_________________________________  NURSE SIGNATURE__________________________________  ________________________________________________________________________   Tim [name]  An incentive spirometer is a tool that can help keep your lungs clear and active. This tool measures how well you are filling your lungs with each breath. Taking long deep breaths may help reverse or decrease the chance of developing breathing (pulmonary) problems (especially infection) following:  A long period of time when you are unable to move or be active. BEFORE THE PROCEDURE   If the spirometer includes an indicator to show your best effort, your nurse or respiratory therapist will set it to a desired goal.  If possible, sit up straight or lean slightly forward. Try not to slouch.  Hold the incentive spirometer in an upright position. INSTRUCTIONS FOR USE  1. Sit on the edge of your bed if possible, or sit up as far as you can in bed or on a chair. 2. Hold the incentive spirometer in an upright position. 3. Breathe out normally. 4. Place the mouthpiece in your mouth and seal your lips tightly around it. 5. Breathe in slowly and as deeply as possible, raising the piston or the ball toward the top of the column. 6. Hold your breath for 3-5 seconds or for as long as possible. Allow the piston or ball to fall to the bottom of the column. 7. Remove the mouthpiece from your mouth and breathe out normally. 8. Rest for a few seconds and repeat Steps 1 through 7 at least 10 times every 1-2 hours when you are awake. Take your time and take a few normal breaths between deep breaths. 9. The spirometer may include  an indicator to show your best effort. Use the indicator as a goal to work toward during each repetition. 10. After each set of 10 deep breaths, practice coughing to be sure your lungs are clear. If you have an incision (the cut made at the time of  surgery), support your incision when coughing by placing a pillow or rolled up towels firmly against it. Once you are able to get out of bed, walk around indoors and cough well. You may stop using the incentive spirometer when instructed by your caregiver.  RISKS AND COMPLICATIONS  Take your time so you do not get dizzy or light-headed.  If you are in pain, you may need to take or ask for pain medication before doing incentive spirometry. It is harder to take a deep breath if you are having pain. AFTER USE  Rest and breathe slowly and easily.  It can be helpful to keep track of a log of your progress. Your caregiver can provide you with a simple table to help with this. If you are using the spirometer at home, follow these instructions: [place] IF:   You are having difficultly using the spirometer.  You have trouble using the spirometer as often as instructed.  Your pain medication is not giving enough relief while using the spirometer.  You develop fever of 100.5 F (38.1 C) or higher. SEEK IMMEDIATE MEDICAL CARE IF:   You cough up bloody sputum that had not been present before.  You develop fever of 102 F (38.9 C) or greater.  You develop worsening pain at or near the incision site. MAKE SURE YOU:   Understand these instructions.  Will watch your condition.  Will get help right away if you are not doing well or get worse. Document Released: 04/18/2007 Document Revised: 02/28/2012 Document Reviewed: 06/19/2007 [facility] Patient Information 2014 [place], [place].   ________________________________________________________________________

## 2015-10-10 ENCOUNTER — Encounter (HOSPITAL_COMMUNITY)
Admission: RE | Admit: 2015-10-10 | Discharge: 2015-10-10 | Disposition: A | Discharge status: Home / Self Care | Payer: Managed Care, Other (non HMO) | Source: Ambulatory Visit | Attending: Orthopedic Surgery | Admitting: Orthopedic Surgery

## 2015-10-10 ENCOUNTER — Encounter (HOSPITAL_COMMUNITY): Payer: Self-pay

## 2015-10-10 DIAGNOSIS — E119 Type 2 diabetes mellitus without complications: Secondary | ICD-10-CM | POA: Diagnosis not present

## 2015-10-10 DIAGNOSIS — Z7982 Long term (current) use of aspirin: Secondary | ICD-10-CM | POA: Diagnosis not present

## 2015-10-10 DIAGNOSIS — Z7984 Long term (current) use of oral hypoglycemic drugs: Secondary | ICD-10-CM | POA: Diagnosis not present

## 2015-10-10 DIAGNOSIS — Z79891 Long term (current) use of opiate analgesic: Secondary | ICD-10-CM | POA: Diagnosis not present

## 2015-10-10 DIAGNOSIS — F17200 Nicotine dependence, unspecified, uncomplicated: Secondary | ICD-10-CM | POA: Diagnosis not present

## 2015-10-10 DIAGNOSIS — I10 Essential (primary) hypertension: Secondary | ICD-10-CM | POA: Diagnosis not present

## 2015-10-10 DIAGNOSIS — Z7901 Long term (current) use of anticoagulants: Secondary | ICD-10-CM | POA: Diagnosis not present

## 2015-10-10 DIAGNOSIS — M24662 Ankylosis, left knee: Secondary | ICD-10-CM | POA: Diagnosis present

## 2015-10-10 DIAGNOSIS — Z79899 Other long term (current) drug therapy: Secondary | ICD-10-CM | POA: Diagnosis not present

## 2015-10-10 DIAGNOSIS — Z96652 Presence of left artificial knee joint: Secondary | ICD-10-CM | POA: Diagnosis not present

## 2015-10-10 LAB — CBC
HCT: 40 % (ref 39.0–52.0)
Hemoglobin: 12.7 g/dL — ABNORMAL LOW (ref 13.0–17.0)
MCH: 24.1 pg — ABNORMAL LOW (ref 26.0–34.0)
MCHC: 31.8 g/dL (ref 30.0–36.0)
MCV: 76 fL — ABNORMAL LOW (ref 78.0–100.0)
PLATELETS: 287 10*3/uL (ref 150–400)
RBC: 5.26 MIL/uL (ref 4.22–5.81)
RDW: 14.4 % (ref 11.5–15.5)
WBC: 5.1 10*3/uL (ref 4.0–10.5)

## 2015-10-10 LAB — BASIC METABOLIC PANEL
ANION GAP: 6 (ref 5–15)
BUN: 13 mg/dL (ref 6–20)
CALCIUM: 9.5 mg/dL (ref 8.9–10.3)
CO2: 28 mmol/L (ref 22–32)
Chloride: 106 mmol/L (ref 101–111)
Creatinine, Ser: 1.07 mg/dL (ref 0.61–1.24)
Glucose, Bld: 125 mg/dL — ABNORMAL HIGH (ref 65–99)
Potassium: 4 mmol/L (ref 3.5–5.1)
Sodium: 140 mmol/L (ref 135–145)

## 2015-10-10 NOTE — Pre-Procedure Instructions (Signed)
EKG 8'16, repeat done today due to tachycardia 95-111 heart rate.

## 2015-10-12 DIAGNOSIS — Z96659 Presence of unspecified artificial knee joint: Secondary | ICD-10-CM

## 2015-10-12 DIAGNOSIS — T8489XA Other specified complication of internal orthopedic prosthetic devices, implants and grafts, initial encounter: Secondary | ICD-10-CM

## 2015-10-12 DIAGNOSIS — M25669 Stiffness of unspecified knee, not elsewhere classified: Secondary | ICD-10-CM | POA: Diagnosis present

## 2015-10-12 NOTE — H&P (Signed)
  CC- Tim [name] is a 59 y.o. male who presents with left knee pain.  HPI- . Knee Pain: Patient presents with stiffness involving the  left knee. Onset of the symptoms was several months ago. Inciting event: He had a lef total knee arthroplasty and has developed post-operative stiffness. He has had adequate physical therapy but is unable to substantially increase his flexion. He presents now for closed manipulation.    Past Medical History  Diagnosis Date  . Hypertension   . Arthritis     Past Surgical History  Procedure Laterality Date  . Total knee arthroplasty Left 07/28/2015    Procedure: LEFT TOTAL KNEE ARTHROPLASTY;  Surgeon: Ollen GrossFrank Holland Kotter, MD;  Location: [facility];  Service: Orthopedics;  Laterality: Left;    Prior to Admission medications   Medication Sig Start Date End Date Taking? Authorizing Provider  amLODipine (NORVASC) 5 MG tablet Take 5 mg by mouth every morning.  07/04/15  Yes Historical Provider, MD  aspirin EC 81 MG tablet Take 81 mg by mouth daily.   Yes Historical Provider, MD  atorvastatin (LIPITOR) 20 MG tablet Take 20 mg by mouth daily. 07/04/15  Yes Historical Provider, MD  metFORMIN (GLUCOPHAGE-XR) 500 MG 24 hr tablet Take 500 mg by mouth 2 (two) times daily. 04/28/15  Yes Historical Provider, MD  Multiple Vitamin (MULTIVITAMIN WITH MINERALS) TABS tablet Take 1 tablet by mouth daily.   Yes Historical Provider, MD  traMADol (ULTRAM) 50 MG tablet Take 1-2 tablets (50-100 mg total) by mouth every 6 (six) hours as needed (mild pain). 07/30/15  Yes Avel Peacerew Perkins, PA-C  valsartan (DIOVAN) 160 MG tablet Take 160 mg by mouth every morning.    Yes Historical Provider, MD  methocarbamol (ROBAXIN) 500 MG tablet Take 1 tablet (500 mg total) by mouth every 6 (six) hours as needed for muscle spasms. Patient not taking: Reported on 10/08/2015 07/30/15   Avel Peacerew Perkins, PA-C  oxyCODONE (OXY IR/ROXICODONE) 5 MG immediate release tablet Take 1-2 tablets (5-10 mg total) by mouth every 3 (three)  hours as needed for moderate pain or severe pain. Patient not taking: Reported on 10/08/2015 07/30/15   Avel Peacerew Perkins, PA-C  rivaroxaban (XARELTO) 10 MG TABS tablet Take 1 tablet (10 mg total) by mouth daily with breakfast. Take Xarelto for two and a half more weeks, then discontinue Xarelto. Once the patient has completed the Xarelto, they may resume the 81 mg Aspirin. Patient not taking: Reported on 10/08/2015 07/30/15   Avel Peacerew Perkins, PA-C   KNEE EXAM antalgic gait, reduced range of motion (10-95 degrees), collateral ligaments intact  Physical Examination: General appearance - alert, well appearing, and in no distress Mental status - alert, oriented to person, place, and time Chest - clear to auscultation, no wheezes, rales or rhonchi, symmetric air entry Heart - normal rate, regular rhythm, normal S1, S2, no murmurs, rubs, clicks or gallops Abdomen - soft, nontender, nondistended, no masses or organomegaly Neurological - alert, oriented, normal speech, no focal findings or movement disorder noted   Asessment/Plan--- Left knee arthrofibrosis- - Plan left knee closed manipulation. Procedure risks and potential comps discussed with patient who elects to proceed. Goals are decreased pain and increased function with a high likelihood of achieving both

## 2015-10-13 ENCOUNTER — Encounter (HOSPITAL_COMMUNITY)
Admission: RE | Disposition: A | Discharge status: Home / Self Care | Payer: Self-pay | Source: Ambulatory Visit | Attending: Orthopedic Surgery

## 2015-10-13 ENCOUNTER — Encounter (HOSPITAL_COMMUNITY): Payer: Self-pay | Admitting: *Deleted

## 2015-10-13 ENCOUNTER — Ambulatory Visit (HOSPITAL_COMMUNITY): Payer: Managed Care, Other (non HMO) | Admitting: Anesthesiology

## 2015-10-13 ENCOUNTER — Ambulatory Visit (HOSPITAL_COMMUNITY)
Admission: RE | Admit: 2015-10-13 | Discharge: 2015-10-13 | Disposition: A | Discharge status: Home / Self Care | Payer: Managed Care, Other (non HMO) | Source: Ambulatory Visit | Attending: Orthopedic Surgery | Admitting: Orthopedic Surgery

## 2015-10-13 DIAGNOSIS — Z96659 Presence of unspecified artificial knee joint: Secondary | ICD-10-CM

## 2015-10-13 DIAGNOSIS — I10 Essential (primary) hypertension: Secondary | ICD-10-CM | POA: Insufficient documentation

## 2015-10-13 DIAGNOSIS — T8489XA Other specified complication of internal orthopedic prosthetic devices, implants and grafts, initial encounter: Secondary | ICD-10-CM

## 2015-10-13 DIAGNOSIS — M24662 Ankylosis, left knee: Secondary | ICD-10-CM | POA: Insufficient documentation

## 2015-10-13 DIAGNOSIS — E119 Type 2 diabetes mellitus without complications: Secondary | ICD-10-CM | POA: Insufficient documentation

## 2015-10-13 DIAGNOSIS — F17200 Nicotine dependence, unspecified, uncomplicated: Secondary | ICD-10-CM | POA: Insufficient documentation

## 2015-10-13 DIAGNOSIS — Z79899 Other long term (current) drug therapy: Secondary | ICD-10-CM | POA: Insufficient documentation

## 2015-10-13 DIAGNOSIS — Z7901 Long term (current) use of anticoagulants: Secondary | ICD-10-CM | POA: Insufficient documentation

## 2015-10-13 DIAGNOSIS — Z96652 Presence of left artificial knee joint: Secondary | ICD-10-CM | POA: Insufficient documentation

## 2015-10-13 DIAGNOSIS — Z79891 Long term (current) use of opiate analgesic: Secondary | ICD-10-CM | POA: Insufficient documentation

## 2015-10-13 DIAGNOSIS — Z7982 Long term (current) use of aspirin: Secondary | ICD-10-CM | POA: Insufficient documentation

## 2015-10-13 DIAGNOSIS — M25669 Stiffness of unspecified knee, not elsewhere classified: Secondary | ICD-10-CM | POA: Diagnosis present

## 2015-10-13 DIAGNOSIS — Z7984 Long term (current) use of oral hypoglycemic drugs: Secondary | ICD-10-CM | POA: Insufficient documentation

## 2015-10-13 HISTORY — PX: KNEE CLOSED REDUCTION: SHX995

## 2015-10-13 LAB — GLUCOSE, CAPILLARY: Glucose-Capillary: 94 mg/dL (ref 65–99)

## 2015-10-13 SURGERY — MANIPULATION, KNEE, CLOSED
Anesthesia: General | Site: Knee | Laterality: Left

## 2015-10-13 MED ORDER — PROPOFOL 10 MG/ML IV BOLUS
INTRAVENOUS | Status: DC | PRN
  Administered 2015-10-13: 200 mg via INTRAVENOUS

## 2015-10-13 MED ORDER — CHLORHEXIDINE GLUCONATE 4 % EX LIQD: 60.0000 mL | Freq: Once | CUTANEOUS | Status: DC

## 2015-10-13 MED ORDER — DEXAMETHASONE SODIUM PHOSPHATE 10 MG/ML IJ SOLN: 10.0000 mg | Freq: Once | INTRAMUSCULAR | Status: DC

## 2015-10-13 MED ORDER — LACTATED RINGERS IV SOLN
INTRAVENOUS | Status: DC
  Administered 2015-10-13: 16:00:00 via INTRAVENOUS

## 2015-10-13 MED ORDER — ACETAMINOPHEN 10 MG/ML IV SOLN
INTRAVENOUS | Status: AC
  Filled 2015-10-13: qty 100

## 2015-10-13 MED ORDER — ONDANSETRON HCL 4 MG/2ML IJ SOLN
INTRAMUSCULAR | Status: DC | PRN
Start: 2015-10-13 — End: 2015-10-13
  Administered 2015-10-13: 4 mg via INTRAVENOUS

## 2015-10-13 MED ORDER — DEXAMETHASONE SODIUM PHOSPHATE 10 MG/ML IJ SOLN
INTRAMUSCULAR | Status: DC | PRN
  Administered 2015-10-13: 10 mg via INTRAVENOUS

## 2015-10-13 MED ORDER — OXYCODONE HCL 5 MG PO TABS: 5.0000 mg | ORAL_TABLET | ORAL | Status: DC | PRN

## 2015-10-13 MED ORDER — FENTANYL CITRATE (PF) 100 MCG/2ML IJ SOLN
25.0000 ug | INTRAMUSCULAR | Status: DC | PRN
  Administered 2015-10-13 (×2): 50 ug via INTRAVENOUS

## 2015-10-13 MED ORDER — ONDANSETRON HCL 4 MG/2ML IJ SOLN
INTRAMUSCULAR | Status: AC
  Filled 2015-10-13: qty 2

## 2015-10-13 MED ORDER — LIDOCAINE HCL (CARDIAC) 20 MG/ML IV SOLN
INTRAVENOUS | Status: AC
  Filled 2015-10-13: qty 5

## 2015-10-13 MED ORDER — FENTANYL CITRATE (PF) 100 MCG/2ML IJ SOLN
INTRAMUSCULAR | Status: DC | PRN
  Administered 2015-10-13 (×2): 50 ug via INTRAVENOUS

## 2015-10-13 MED ORDER — SODIUM CHLORIDE 0.9 % IV SOLN: INTRAVENOUS | Status: DC

## 2015-10-13 MED ORDER — MIDAZOLAM HCL 2 MG/2ML IJ SOLN
INTRAMUSCULAR | Status: AC
  Filled 2015-10-13: qty 4

## 2015-10-13 MED ORDER — LACTATED RINGERS IV SOLN: INTRAVENOUS | Status: DC

## 2015-10-13 MED ORDER — FENTANYL CITRATE (PF) 100 MCG/2ML IJ SOLN
INTRAMUSCULAR | Status: AC
  Filled 2015-10-13: qty 2

## 2015-10-13 MED ORDER — DEXAMETHASONE SODIUM PHOSPHATE 10 MG/ML IJ SOLN
INTRAMUSCULAR | Status: AC
  Filled 2015-10-13: qty 1

## 2015-10-13 MED ORDER — MIDAZOLAM HCL 5 MG/5ML IJ SOLN
INTRAMUSCULAR | Status: DC | PRN
  Administered 2015-10-13: 2 mg via INTRAVENOUS

## 2015-10-13 MED ORDER — FENTANYL CITRATE (PF) 100 MCG/2ML IJ SOLN
INTRAMUSCULAR | Status: AC
  Filled 2015-10-13: qty 4

## 2015-10-13 MED ORDER — LIDOCAINE HCL (CARDIAC) 20 MG/ML IV SOLN
INTRAVENOUS | Status: DC | PRN
  Administered 2015-10-13: 100 mg via INTRAVENOUS

## 2015-10-13 MED ORDER — PROPOFOL 10 MG/ML IV BOLUS
INTRAVENOUS | Status: AC
  Filled 2015-10-13: qty 20

## 2015-10-13 MED ORDER — ACETAMINOPHEN 10 MG/ML IV SOLN
1000.0000 mg | Freq: Once | INTRAVENOUS | Status: AC
  Administered 2015-10-13: 1000 mg via INTRAVENOUS

## 2015-10-13 SURGICAL SUPPLY — 9 items
BANDAGE ADH SHEER 1  50/CT (GAUZE/BANDAGES/DRESSINGS) IMPLANT
GAUZE SPONGE 4X4 12PLY STRL (GAUZE/BANDAGES/DRESSINGS) IMPLANT
GLOVE BIO SURGEON STRL SZ8 (GLOVE) IMPLANT
GLOVE BIOGEL PI IND STRL 8 (GLOVE) IMPLANT
GLOVE BIOGEL PI INDICATOR 8 (GLOVE)
NDL SAFETY ECLIPSE 18X1.5 (NEEDLE) IMPLANT
NEEDLE HYPO 18GX1.5 SHARP (NEEDLE)
SWABSTICK PVP IODINE (MISCELLANEOUS) IMPLANT
SYR CONTROL 10ML LL (SYRINGE) IMPLANT

## 2015-10-13 NOTE — Anesthesia Postprocedure Evaluation (Signed)
  Anesthesia Post-op Note  Patient: Tim [name]  Procedure(s) Performed: Procedure(s) (LRB): LEFT CLOSED MANIPULATION KNEE (Left)  Patient Location: PACU  Anesthesia Type: General  Level of Consciousness: awake and alert   Airway and Oxygen Therapy: Patient Spontanous Breathing  Post-op Pain: mild  Post-op Assessment: Post-op Vital signs reviewed, Patient's Cardiovascular Status Stable, Respiratory Function Stable, Patent Airway and No signs of Nausea or vomiting  Last Vitals:  Filed Vitals:   10/13/15 1730  BP: 126/80  Pulse: 84  Temp: 36.7 C  Resp: 15    Post-op Vital Signs: stable   Complications: No apparent anesthesia complications

## 2015-10-13 NOTE — Transfer of Care (Signed)
Immediate Anesthesia Transfer of Care Note  Patient: Tim [name]  Procedure(s) Performed: Procedure(s): LEFT CLOSED MANIPULATION KNEE (Left)  Patient Location: PACU  Anesthesia Type:General  Level of Consciousness: sedated  Airway & Oxygen Therapy: Patient Spontanous Breathing and Patient connected to face mask oxygen  Post-op Assessment: Report given to RN and Post -op Vital signs reviewed and stable  Post vital signs: Reviewed and stable  Last Vitals:  Filed Vitals:   10/13/15 1445  BP: 127/98  Pulse: 89  Temp: 36.6 C  Resp: 18    Complications: No apparent anesthesia complications

## 2015-10-13 NOTE — Anesthesia Preprocedure Evaluation (Addendum)
Anesthesia Evaluation  Patient identified by MRN, date of birth, ID band Patient awake    Reviewed: Allergy & Precautions, H&P , NPO status , Patient's Chart, lab work & pertinent test results  Airway Mallampati: II  TM Distance: >3 FB Neck ROM: full    Dental no notable dental hx. (+) Dental Advisory Given, Teeth Intact   Pulmonary neg pulmonary ROS, Current Smoker,    Pulmonary exam normal breath sounds clear to auscultation       Cardiovascular Exercise Tolerance: Good hypertension, Pt. on medications Normal cardiovascular exam Rhythm:regular Rate:Normal     Neuro/Psych negative neurological ROS  negative psych ROS   GI/Hepatic negative GI ROS, Neg liver ROS,   Endo/Other  diabetes, Well Controlled, Type 2, Oral Hypoglycemic Agents  Renal/GU negative Renal ROS  negative genitourinary   Musculoskeletal   Abdominal   Peds  Hematology negative hematology ROS (+)   Anesthesia Other Findings   Reproductive/Obstetrics negative OB ROS                            Anesthesia Physical Anesthesia Plan  ASA: III  Anesthesia Plan: General   Post-op Pain Management:    Induction: Intravenous  Airway Management Planned: LMA  Additional Equipment:   Intra-op Plan:   Post-operative Plan:   Informed Consent:   Plan Discussed with: Surgeon  Anesthesia Plan Comments:         Anesthesia Quick Evaluation

## 2015-10-13 NOTE — Interval H&P Note (Signed)
History and Physical Interval Note:  10/13/2015 4:23 PM  Tim [name]  has presented today for surgery, with the diagnosis of ARTHROFIBROISIS OF LEFT KNEE  The various methods of treatment have been discussed with the patient and family. After consideration of risks, benefits and other options for treatment, the patient has consented to  Procedure(s): LEFT CLOSED MANIPULATION KNEE (Left) as a surgical intervention .  The patient's history has been reviewed, patient examined, no change in status, stable for surgery.  I have reviewed the patient's chart and labs.  Questions were answered to the patient's satisfaction.     Loanne DrillingALUISIO,Tabby Beaston V

## 2015-10-13 NOTE — Op Note (Signed)
  OPERATIVE REPORT   PREOPERATIVE DIAGNOSIS: Arthrofibrosis, Left  knee.   POSTOPERATIVE DIAGNOSIS: Arthrofibrosis, Left knee.   PROCEDURE:  Left  knee closed manipulation.   SURGEON: Ollen GrossFrank Siyon Linck, MD   ASSISTANT: None.   ANESTHESIA: General.   COMPLICATIONS: None.   CONDITION: Stable to Recovery.   Pre-manipulation range of motion is 10-90.  Post-manipulation range of  Motion is 5-115  PROCEDURE IN DETAIL: After successful administration of general  anesthetic, exam under anesthesia was performed showing range of motion  10-90 degrees. I then placed my chest against the proximal tibia,  flexing the knee with audible lysis of adhesions. I was able to  get the knee flexed to 115  degrees. I then put the knee back in extension and with some  patellar manipulation and gentle pressure got within 5 degrees of full  Extension.The patient was subsequently awakened and transported to Recovery in  stable condition.

## 2015-10-14 ENCOUNTER — Ambulatory Visit: Payer: Managed Care, Other (non HMO) | Admitting: Physical Therapy

## 2015-10-14 ENCOUNTER — Encounter: Payer: Self-pay | Admitting: Physical Therapy

## 2015-10-14 DIAGNOSIS — M25562 Pain in left knee: Secondary | ICD-10-CM

## 2015-10-14 DIAGNOSIS — M25662 Stiffness of left knee, not elsewhere classified: Secondary | ICD-10-CM

## 2015-10-14 DIAGNOSIS — R262 Difficulty in walking, not elsewhere classified: Secondary | ICD-10-CM

## 2015-10-14 DIAGNOSIS — M25462 Effusion, left knee: Secondary | ICD-10-CM

## 2015-10-14 NOTE — Therapy (Signed)
[facility] [address] [place], [place], [address] Phone: [phone]   Fax:  [phone]  Physical Therapy Treatment  Patient Details  Name: Tim [name] MRN: [id number] Date of Birth: 08-Jun-1956 No Data Recorded  Encounter Date: 10/14/2015      PT End of Session - 10/14/15 1435    Visit Number 20   Date for PT Re-Evaluation 10/20/15   PT Start Time 1353   PT Stop Time 1436   PT Time Calculation (min) 43 min      Past Medical History  Diagnosis Date  . Hypertension   . Arthritis     Past Surgical History  Procedure Laterality Date  . Total knee arthroplasty Left 07/28/2015    Procedure: LEFT TOTAL KNEE ARTHROPLASTY;  Surgeon: Ollen Gross, MD;  Location: [facility];  Service: Orthopedics;  Laterality: Left;  . Knee closed reduction Left 10/13/2015    Procedure: LEFT CLOSED MANIPULATION KNEE;  Surgeon: Ollen Gross, MD;  Location: [facility];  Service: Orthopedics;  Laterality: Left;    There were no vitals filed for this visit.  Visit Diagnosis:  Swelling of left knee joint  Difficulty walking  Left knee pain  Knee stiffness, left      Subjective Assessment - 10/14/15 1353    Subjective Pt reports manipulation yesterday and MD reporting 110 to 115 degrees of L knee flexion    Currently in Pain? Yes   Pain Score 2   pain pill 45 minutes ago    Pain Location Knee   Pain Orientation Left   Pain Descriptors / Indicators Tightness   Pain Type Surgical pain            [facility] PT Assessment - 10/14/15 0001    AROM   Left Knee Extension 15   Left Knee Flexion 92   PROM   Overall PROM Comments 10-95                     [facility] Adult PT Treatment/Exercise - 10/14/15 0001    Knee/Hip Exercises: Aerobic   Elliptical I 20 R10 78fwd/3back   Recumbent Bike 4 min  able to make full rev.   Knee/Hip Exercises: Machines for Strengthening   Cybex Leg Press 80# 3x10, with flexion stretch 110  degrees    Knee/Hip Exercises: Standing   Forward Step Up Left;10 reps;Step Height: 8";Both;Hand Hold: 1;3 sets   Other Standing Knee Exercises squats 2x10x 3 sec hold   Other Standing Knee Exercises cable pull down 190 # 2 sets 15   Manual Therapy   Manual Therapy Passive ROM   Passive ROM L knee flex and ext                  PT Short Term Goals - 08/28/15 1055    PT SHORT TERM GOAL #1   Title independent with initial HEP   Status Achieved           PT Long Term Goals - 10/06/15 1104    PT LONG TERM GOAL #1   Title increase AROM of the left knee to 5-115 degrees flexion   Status On-going   PT LONG TERM GOAL #2   Title decrease pain 50%   Status On-going               Plan - 10/14/15 1436    Clinical Impression Statement Pt 8 minutes late for today's PT session. Continues to be very tight  after manipulation, AROM 14-92 degrees L knee flexion. continues to give good efforts during PT session. pain with MT   Pt will benefit from skilled therapeutic intervention in order to improve on the following deficits Abnormal gait;Decreased mobility;Decreased range of motion;Difficulty walking;Decreased strength;Increased edema;Pain   Rehab Potential Good   PT Frequency 3x / week   PT Duration 8 weeks   PT Treatment/Interventions Electrical Stimulation;Cryotherapy;Gait training;Neuromuscular re-education;Therapeutic exercise;Therapeutic activities;Functional mobility training;Stair training;Patient/family education;Manual techniques   PT Next Visit Plan Gait/ Manual therapy for ROM, quad strength            Problem List Patient Active Problem List   Diagnosis Date Noted  . Postoperative stiffness of total knee replacement (HCC) 10/12/2015  . OA (osteoarthritis) of knee 07/28/2015    Grayce Sessionsonald G Liza Czerwinski, PTA  10/14/2015, 2:39 PM  [facility] [address] [place], [place], [address] Phone:  [phone]   Fax:  [phone]  Name: Tim [name] MRN: [id number] Date of Birth: May 14, 1956

## 2015-10-16 ENCOUNTER — Encounter: Payer: Self-pay | Admitting: Physical Therapy

## 2015-10-16 ENCOUNTER — Ambulatory Visit: Payer: Managed Care, Other (non HMO) | Admitting: Physical Therapy

## 2015-10-16 DIAGNOSIS — M25562 Pain in left knee: Secondary | ICD-10-CM

## 2015-10-16 DIAGNOSIS — M25462 Effusion, left knee: Secondary | ICD-10-CM

## 2015-10-16 DIAGNOSIS — M25662 Stiffness of left knee, not elsewhere classified: Secondary | ICD-10-CM | POA: Diagnosis not present

## 2015-10-16 DIAGNOSIS — R262 Difficulty in walking, not elsewhere classified: Secondary | ICD-10-CM

## 2015-10-16 NOTE — Therapy (Signed)
[place], [place], [address] Phone: [phone]   Fax:  [phone]  Physical Therapy Treatment  Patient Details  Name: Tim [name] MRN: [id number] Date of Birth: [birth year] No Data Recorded  Encounter Date: 10/16/2015      PT End of Session - 10/16/15 1511    Visit Number 21   Date for PT Re-Evaluation 10/20/15   PT Start Time 1428   PT Stop Time 1525   PT Time Calculation (min) 57 min   Activity Tolerance Patient tolerated treatment well   Behavior During Therapy [facility] for tasks assessed/performed      Past Medical History  Diagnosis Date  . Hypertension   . Arthritis     Past Surgical History  Procedure Laterality Date  . Total knee arthroplasty Left 07/28/2015    Procedure: LEFT TOTAL KNEE ARTHROPLASTY;  Surgeon: Gaynelle Arabian, MD;  Location: [facility];  Service: Orthopedics;  Laterality: Left;  . Knee closed reduction Left 10/13/2015    Procedure: LEFT CLOSED MANIPULATION KNEE;  Surgeon: Gaynelle Arabian, MD;  Location: [facility];  Service: Orthopedics;  Laterality: Left;    There were no vitals filed for this visit.  Visit Diagnosis:  Left knee pain  Knee stiffness, left  Swelling of left knee joint  Difficulty walking      Subjective Assessment - 10/16/15 1429    Subjective Pt reports that things are going all right, reports doing yard work yesterday.   Currently in Pain? No/denies   Pain Score 0-No pain                         [facility] Adult PT Treatment/Exercise - 10/16/15 0001    Ambulation/Gait   Gait Comments stairs reciprocally up and down x2 , skip step with LLE on ascends   Knee/Hip Exercises: Stretches   Other Knee/Hip Stretches Lunge stretch 3 sec 10 reps   Knee/Hip Exercises: Aerobic   Elliptical I 20 R5 35fwd/3back   Knee/Hip Exercises: Machines for Strengthening   Cybex Knee Extension 15 # 2 sets RLE  10   Cybex Knee Flexion 35# left leg only 2  sets 15 reps    Cybex Leg Press 80# 3x10, with flexion stretch 110 degrees    Knee/Hip Exercises: Standing   Forward Step Up Left;10 reps;Step Height: 8";Both;Hand Hold: 1;3 sets  explosive    Other Standing Knee Exercises squats 2x10x 3 sec hold   Manual Therapy   Manual Therapy Passive ROM   Passive ROM L knee flex and ext                  PT Short Term Goals - 08/28/15 1055    PT SHORT TERM GOAL #1   Title independent with initial HEP   Status Achieved           PT Long Term Goals - 10/16/15 1513    PT LONG TERM GOAL #2   Title decrease pain 50%   Status Partially Met   PT LONG TERM GOAL #3   Title walk community distances without assistive device and minimal deviation   Status Achieved               Plan - 10/16/15 1511    Clinical Impression Statement Pt limited in L knee flexion and extension, Completed all exercises this date, does display better control when skipping step with LLE ascending stairs. L knee ROM did  increase with leg press intervention.   Pt will benefit from skilled therapeutic intervention in order to improve on the following deficits Abnormal gait;Decreased mobility;Decreased range of motion;Difficulty walking;Decreased strength;Increased edema;Pain   Rehab Potential Good   PT Frequency 3x / week   PT Duration 8 weeks   PT Treatment/Interventions Electrical Stimulation;Cryotherapy;Gait training;Neuromuscular re-education;Therapeutic exercise;Therapeutic activities;Functional mobility training;Stair training;Patient/family education;Manual techniques   PT Next Visit Plan Gait/ Manual therapy for ROM, quad strength            Problem List Patient Active Problem List   Diagnosis Date Noted  . Postoperative stiffness of total knee replacement ([place]) 10/12/2015  . OA (osteoarthritis) of knee 07/28/2015    Scot Jun, PTA  10/16/2015, 3:15 PM  [place]  [place], [place], [address] Phone: [phone]   Fax:  [phone]  Name: Yazen Rosko MRN: [id number] Date of Birth: Dec 29, 1955

## 2015-10-17 ENCOUNTER — Ambulatory Visit: Payer: Managed Care, Other (non HMO) | Admitting: Physical Therapy

## 2015-10-17 ENCOUNTER — Encounter: Payer: Self-pay | Admitting: Physical Therapy

## 2015-10-17 DIAGNOSIS — M25562 Pain in left knee: Secondary | ICD-10-CM

## 2015-10-17 DIAGNOSIS — M25662 Stiffness of left knee, not elsewhere classified: Secondary | ICD-10-CM

## 2015-10-17 DIAGNOSIS — M25462 Effusion, left knee: Secondary | ICD-10-CM

## 2015-10-17 DIAGNOSIS — R262 Difficulty in walking, not elsewhere classified: Secondary | ICD-10-CM

## 2015-10-17 NOTE — Therapy (Signed)
[place], [place], [address] Phone: [phone]   Fax:  [phone]  Physical Therapy Treatment  Patient Details  Name: Tim [name] MRN: [id number] Date of Birth: Dec 28, 1955 No Data Recorded  Encounter Date: 10/17/2015      PT End of Session - 10/17/15 1147    Visit Number 22   Date for PT Re-Evaluation 10/20/15   PT Start Time 1100   PT Stop Time 1147   PT Time Calculation (min) 47 min   Activity Tolerance Patient tolerated treatment well   Behavior During Therapy [facility] for tasks assessed/performed      Past Medical History  Diagnosis Date  . Hypertension   . Arthritis     Past Surgical History  Procedure Laterality Date  . Total knee arthroplasty Left 07/28/2015    Procedure: LEFT TOTAL KNEE ARTHROPLASTY;  Surgeon: Gaynelle Arabian, MD;  [facility];  Service: Orthopedics;  Laterality: Left;  . Knee closed reduction Left 10/13/2015    Procedure: LEFT CLOSED MANIPULATION KNEE;  Surgeon: Gaynelle Arabian, MD;  [facility];  Service: Orthopedics;  Laterality: Left;    There were no vitals filed for this visit.  Visit Diagnosis:  Swelling of left knee joint  Knee stiffness, left  Left knee pain  Difficulty walking      Subjective Assessment - 10/17/15 1111    Subjective Pt reports things are going fine    Currently in Pain? No/denies   Pain Score 0-No pain                         [facility] Adult PT Treatment/Exercise - 10/17/15 0001    Knee/Hip Exercises: Stretches   Other Knee/Hip Stretches Lunge stretch 3 sec 10 reps   Knee/Hip Exercises: Aerobic   Elliptical I 20 R5 58fwd/4back   Recumbent Bike 4 min  able to make full rev.   Knee/Hip Exercises: Machines for Strengthening   Cybex Knee Extension 15 # 2 sets RLE  10   Cybex Knee Flexion 35# left leg only 2 sets 15 reps    Cybex Leg Press 80# 3x10, with flexion stretch 110 degrees    Knee/Hip Exercises:  Standing   Forward Step Up Left;10 reps;Step Height: 8";Both;Hand Hold: 1;3 sets  explosive    Other Standing Knee Exercises squats 2x10x 3 sec hold   Other Standing Knee Exercises cable pull down 190 # 2 sets 15   Manual Therapy   Manual Therapy Passive ROM   Passive ROM L knee flex and ext                  PT Short Term Goals - 08/28/15 1055    PT SHORT TERM GOAL #1   Title independent with initial HEP   Status Achieved           PT Long Term Goals - 10/16/15 1513    PT LONG TERM GOAL #2   Title decrease pain 50%   Status Partially Met   PT LONG TERM GOAL #3   Title walk community distances without assistive device and minimal deviation   Status Achieved               Plan - 10/17/15 1147    Clinical Impression Statement Continues to work hard in PT and give great effort, remains limited in L knee flexion and ext   Pt will benefit from skilled therapeutic intervention in  order to improve on the following deficits Abnormal gait;Decreased mobility;Decreased range of motion;Difficulty walking;Decreased strength;Increased edema;Pain   Rehab Potential Good   PT Frequency 3x / week   PT Duration 8 weeks   PT Treatment/Interventions Electrical Stimulation;Cryotherapy;Gait training;Neuromuscular re-education;Therapeutic exercise;Therapeutic activities;Functional mobility training;Stair training;Patient/family education;Manual techniques   PT Next Visit Plan Gait/ Manual therapy for ROM, quad strength            Problem List Patient Active Problem List   Diagnosis Date Noted  . Postoperative stiffness of total knee replacement ([place]) 10/12/2015  . OA (osteoarthritis) of knee 07/28/2015    Scot Jun, PTA  10/17/2015, 11:48 AM  [place], [place], [address] Phone: [phone]   Fax:  [phone]  Name: Jakhi Dishman MRN: [id number] Date of Birth:  1956-05-14

## 2015-10-21 ENCOUNTER — Ambulatory Visit: Payer: Managed Care, Other (non HMO) | Attending: Orthopedic Surgery | Admitting: Physical Therapy

## 2015-10-21 DIAGNOSIS — R262 Difficulty in walking, not elsewhere classified: Secondary | ICD-10-CM | POA: Diagnosis not present

## 2015-10-21 DIAGNOSIS — M25562 Pain in left knee: Secondary | ICD-10-CM | POA: Insufficient documentation

## 2015-10-21 DIAGNOSIS — M25462 Effusion, left knee: Secondary | ICD-10-CM | POA: Insufficient documentation

## 2015-10-21 DIAGNOSIS — M25662 Stiffness of left knee, not elsewhere classified: Secondary | ICD-10-CM | POA: Diagnosis present

## 2015-10-21 NOTE — Therapy (Signed)
[place], [place], [address] Phone: [phone]   Fax:  [phone]  Physical Therapy Treatment  Patient Details  Name: Tim [name] MRN: [id number] Date of Birth: [birth year] No Data Recorded  Encounter Date: 10/21/2015      PT End of Session - 10/21/15 1343    Visit Number 23   Date for PT Re-Evaluation 10/20/15   PT Start Time 1300   PT Stop Time 1403   PT Time Calculation (min) 63 min      Past Medical History  Diagnosis Date  . Hypertension   . Arthritis     Past Surgical History  Procedure Laterality Date  . Total knee arthroplasty Left 07/28/2015    Procedure: LEFT TOTAL KNEE ARTHROPLASTY;  Surgeon: Gaynelle Arabian, MD;  [facility];  Service: Orthopedics;  Laterality: Left;  . Knee closed reduction Left 10/13/2015    Procedure: LEFT CLOSED MANIPULATION KNEE;  Surgeon: Gaynelle Arabian, MD;  [facility];  Service: Orthopedics;  Laterality: Left;    There were no vitals filed for this visit.  Visit Diagnosis:  Difficulty walking  Left knee pain  Knee stiffness, left  Swelling of left knee joint      Subjective Assessment - 10/21/15 1300    Subjective "Im feeling good" "I can get up of chairs now"   Currently in Pain? No/denies   Pain Score 0-No pain            [facility] PT Assessment - 10/21/15 0001    AROM   Left Knee Extension 10   Left Knee Flexion 89   PROM   Overall PROM Comments 11-100                     [facility] Adult PT Treatment/Exercise - 10/21/15 0001    Ambulation/Gait   Gait Comments stairs reciprocally up and down x3 , skip step with LLE on ascends   Knee/Hip Exercises: Stretches   Other Knee/Hip Stretches Lunge stretch 3 sec 10 reps   Knee/Hip Exercises: Aerobic   Elliptical I 20 R5 17fwd/4back   Recumbent Bike 3 min  able to make full rev.   Knee/Hip Exercises: Machines for Strengthening   Cybex Knee Extension 20 # 2 sets RLE   15   Cybex Knee Flexion 35# left leg only 2 sets 15 reps    Cybex Leg Press LLE #40 L7 x15    Knee/Hip Exercises: Standing   Forward Step Up Left;10 reps;Step Height: 8";Hand Hold: 1;1 set   Other Standing Knee Exercises squats 2x10x 3 sec hold   Cryotherapy   Number Minutes Cryotherapy 15 Minutes   Cryotherapy Location Knee   Vasopneumatic   Number Minutes Vasopneumatic  15 minutes   Vasopnuematic Location  Knee   Vasopneumatic Pressure High   Vasopneumatic Temperature  32   Manual Therapy   Manual Therapy Passive ROM   Passive ROM L knee flex and ext                  PT Short Term Goals - 08/28/15 1055    PT SHORT TERM GOAL #1   Title independent with initial HEP   Status Achieved           PT Long Term Goals - 10/16/15 1513    PT LONG TERM GOAL #2   Title decrease pain 50%   Status Partially Met   PT LONG TERM GOAL #3  Title walk community distances without assistive device and minimal deviation   Status Achieved               Plan - 10/21/15 1345    Clinical Impression Statement Good effort this treatment session. minor PROM gain but required great effort. Pt able to increase ROM with MT requiring 2 people    Pt will benefit from skilled therapeutic intervention in order to improve on the following deficits Abnormal gait;Decreased mobility;Decreased range of motion;Difficulty walking;Decreased strength;Increased edema;Pain   Rehab Potential Good   PT Frequency 3x / week   PT Duration 8 weeks   PT Treatment/Interventions Electrical Stimulation;Cryotherapy;Gait training;Neuromuscular re-education;Therapeutic exercise;Therapeutic activities;Functional mobility training;Stair training;Patient/family education;Manual techniques   PT Next Visit Plan Gait/ Manual therapy for ROM, quad strength            Problem List Patient Active Problem List   Diagnosis Date Noted  . Postoperative stiffness of total knee replacement ([place]) 10/12/2015  . OA  (osteoarthritis) of knee 07/28/2015    Scot Jun, PTA  10/21/2015, 1:49 PM  [place], [place], [address] Phone: [phone]   Fax:  [phone]  Name: Tim [name] MRN: [id number] Date of Birth: Feb 17, 1956

## 2015-10-23 ENCOUNTER — Ambulatory Visit: Payer: Managed Care, Other (non HMO) | Admitting: Physical Therapy

## 2015-10-23 ENCOUNTER — Encounter: Payer: Self-pay | Admitting: Physical Therapy

## 2015-10-23 DIAGNOSIS — M25562 Pain in left knee: Secondary | ICD-10-CM

## 2015-10-23 DIAGNOSIS — R262 Difficulty in walking, not elsewhere classified: Secondary | ICD-10-CM

## 2015-10-23 DIAGNOSIS — M25462 Effusion, left knee: Secondary | ICD-10-CM

## 2015-10-23 DIAGNOSIS — M25662 Stiffness of left knee, not elsewhere classified: Secondary | ICD-10-CM

## 2015-10-23 NOTE — Therapy (Signed)
[place], [place], [address] Phone: [phone]   Fax:  [phone]  Physical Therapy Treatment  Patient Details  Name: Tim [name] MRN: [id number] Date of Birth: Jul 07, 1956 No Data Recorded  Encounter Date: 10/23/2015      PT End of Session - 10/23/15 1355    PT Start Time 1302   Activity Tolerance Patient tolerated treatment well   Behavior During Therapy [facility] for tasks assessed/performed      Past Medical History  Diagnosis Date  . Hypertension   . Arthritis     Past Surgical History  Procedure Laterality Date  . Total knee arthroplasty Left 07/28/2015    Procedure: LEFT TOTAL KNEE ARTHROPLASTY;  Surgeon: Gaynelle Arabian, MD;  [facility];  Service: Orthopedics;  Laterality: Left;  . Knee closed reduction Left 10/13/2015    Procedure: LEFT CLOSED MANIPULATION KNEE;  Surgeon: Gaynelle Arabian, MD;  [facility];  Service: Orthopedics;  Laterality: Left;    There were no vitals filed for this visit.  Visit Diagnosis:  Left knee pain  Knee stiffness, left  Difficulty walking  Swelling of left knee joint      Subjective Assessment - 10/23/15 1301    Subjective "Feeling good, wake up in the morning and don't have to shake it off"   Currently in Pain? Yes   Pain Score 2    Pain Location Knee   Pain Orientation Left   Pain Descriptors / Indicators Tightness                         [facility] Adult PT Treatment/Exercise - 10/23/15 0001    Ambulation/Gait   Gait Comments stairs reciprocally up and down x3 , skip step with LLE on ascends   Knee/Hip Exercises: Aerobic   Elliptical I 20 R5 32fd/4back   Knee/Hip Exercises: Machines for Strengthening   Cybex Knee Extension 20 # 2 sets RLE  15   Cybex Knee Flexion 45# left leg only 2 sets 15 reps    Cybex Leg Press LLE #40 L7 x15    Knee/Hip Exercises: Standing   Forward Step Up Left;10 reps;Step Height: 8";Hand  Hold: 1;1 set   Other Standing Knee Exercises squats 2x10x 3 sec hold   Other Standing Knee Exercises cable pull down 190 # 2 sets 15   Cryotherapy   Number Minutes Cryotherapy 15 Minutes   Cryotherapy Location Knee   Vasopneumatic   Number Minutes Vasopneumatic  15 minutes   Vasopnuematic Location  Knee   Vasopneumatic Pressure High   Vasopneumatic Temperature  32   Manual Therapy   Manual Therapy Passive ROM   Passive ROM L knee flex and ext                  PT Short Term Goals - 08/28/15 1055    PT SHORT TERM GOAL #1   Title independent with initial HEP   Status Achieved           PT Long Term Goals - 10/16/15 1513    PT LONG TERM GOAL #2   Title decrease pain 50%   Status Partially Met   PT LONG TERM GOAL #3   Title walk community distances without assistive device and minimal deviation   Status Achieved               Plan - 10/23/15 1356    Clinical Impression Statement great  effort this PT session. L knee ROM remain restricted. Guarded with manual therapy due to pain.  negotiate stairs without issue.   Pt will benefit from skilled therapeutic intervention in order to improve on the following deficits Abnormal gait;Decreased mobility;Decreased range of motion;Difficulty walking;Decreased strength;Increased edema;Pain   Rehab Potential Good   PT Frequency 3x / week   PT Duration 8 weeks   PT Treatment/Interventions Electrical Stimulation;Cryotherapy;Gait training;Neuromuscular re-education;Therapeutic exercise;Therapeutic activities;Functional mobility training;Stair training;Patient/family education;Manual techniques   PT Next Visit Plan Gait/ Manual therapy for ROM, quad strength            Problem List Patient Active Problem List   Diagnosis Date Noted  . Postoperative stiffness of total knee replacement ([place]) 10/12/2015  . OA (osteoarthritis) of knee 07/28/2015    Scot Jun, PTA  10/23/2015, 1:59 PM  [place], [place], [address] Phone: [phone]   Fax:  [phone]  Name: Tim [name] MRN: [id number] Date of Birth: [birth year]

## 2015-10-24 ENCOUNTER — Ambulatory Visit: Payer: Managed Care, Other (non HMO) | Admitting: Physical Therapy

## 2015-10-24 ENCOUNTER — Encounter: Payer: Self-pay | Admitting: Physical Therapy

## 2015-10-24 DIAGNOSIS — M25462 Effusion, left knee: Secondary | ICD-10-CM

## 2015-10-24 DIAGNOSIS — M25662 Stiffness of left knee, not elsewhere classified: Secondary | ICD-10-CM

## 2015-10-24 DIAGNOSIS — M25562 Pain in left knee: Secondary | ICD-10-CM

## 2015-10-24 DIAGNOSIS — R262 Difficulty in walking, not elsewhere classified: Secondary | ICD-10-CM | POA: Diagnosis not present

## 2015-10-24 NOTE — Therapy (Signed)
[facility] [place] [address] [place], [place], [address] Phone: [phone]   Fax:  [phone]  Physical Therapy Treatment  Patient Details  Name: Tim [name] MRN: [id number] Date of Birth: [birth year] No Data Recorded  Encounter Date: 10/24/2015      PT End of Session - 10/24/15 1149    Visit Number 24   Date for PT Re-Evaluation 10/20/15   PT Start Time 1057   PT Stop Time 1148   PT Time Calculation (min) 51 min   Activity Tolerance Patient tolerated treatment well   Behavior During Therapy [facility] for tasks assessed/performed      Past Medical History  Diagnosis Date  . Hypertension   . Arthritis     Past Surgical History  Procedure Laterality Date  . Total knee arthroplasty Left 07/28/2015    Procedure: LEFT TOTAL KNEE ARTHROPLASTY;  Surgeon: Ollen Gross, MD;  [facility];  Service: Orthopedics;  Laterality: Left;  . Knee closed reduction Left 10/13/2015    Procedure: LEFT CLOSED MANIPULATION KNEE;  Surgeon: Ollen Gross, MD;  [facility];  Service: Orthopedics;  Laterality: Left;    There were no vitals filed for this visit.  Visit Diagnosis:  Difficulty walking  Swelling of left knee joint  Knee stiffness, left  Left knee pain      Subjective Assessment - 10/24/15 1100    Subjective Feeling good, stiff this morning    Currently in Pain? Yes   Pain Score 1    Pain Location Knee   Pain Orientation Left   Pain Descriptors / Indicators Tightness   Pain Type Surgical pain                         [facility] Adult PT Treatment/Exercise - 10/24/15 0001    Ambulation/Gait   Gait Comments stairs reciprocally up and down x3 , skip step with LLE on ascends   Knee/Hip Exercises: Stretches   Other Knee/Hip Stretches Lunge stretch 3 sec 10 reps   Knee/Hip Exercises: Aerobic   Elliptical I 20 R5 30fwd/4back   Recumbent Bike 4 min  able to make full rev.   Knee/Hip Exercises:  Machines for Strengthening   Cybex Knee Extension 20 # 2 sets RLE  10   Cybex Knee Flexion 45# left leg only 2 sets 10 reps    Cybex Leg Press LLE #40 L7 x15    Knee/Hip Exercises: Standing   Forward Step Up Left;10 reps;Step Height: 8";Hand Hold: 1;1 set   Other Standing Knee Exercises squats 2x10x 3 sec hold   Manual Therapy   Passive ROM L knee flex and ext                  PT Short Term Goals - 08/28/15 1055    PT SHORT TERM GOAL #1   Title independent with initial HEP   Status Achieved           PT Long Term Goals - 10/24/15 1149    PT LONG TERM GOAL #2   Title decrease pain 50%   Status Achieved   PT LONG TERM GOAL #4   Title go up and down stairs reciprocally   Status Achieved               Plan - 10/24/15 1150    Clinical Impression Statement Pt tolerated PT treatment well. L knee ROM remains limited despite pt effort. Requires great effort  with manual therapy with PROM to reach end ranges     Pt will benefit from skilled therapeutic intervention in order to improve on the following deficits Abnormal gait;Decreased mobility;Decreased range of motion;Difficulty walking;Decreased strength;Increased edema;Pain   Rehab Potential Good   PT Frequency 3x / week   PT Duration 8 weeks   PT Treatment/Interventions Electrical Stimulation;Cryotherapy;Gait training;Neuromuscular re-education;Therapeutic exercise;Therapeutic activities;Functional mobility training;Stair training;Patient/family education;Manual techniques   PT Next Visit Plan aggressive ROM     Patient had a manipulation recently but had no carry over effects for increased ROM.  During PROM with tow people all of his limitation seems to be him guarding in the quad and hip flexor.  He c/o some knee pain with PROM but the hip is where the tightness seems to be.  We will refer to PT for dry needling.  Stacie GlazeMichael Albright, PT   Problem List Patient Active Problem List   Diagnosis Date Noted  .  Postoperative stiffness of total knee replacement ([facility]) 10/12/2015  . OA (osteoarthritis) of knee 07/28/2015    Jearld LeschALBRIGHT,MICHAEL W, PTA  10/24/2015, 11:59 AM  [facility] [address] [place], [place], [address] Phone: [phone]   Fax:  [phone]  Name: Tim [name] MRN: [id number] Date of Birth: [birth year]

## 2015-10-27 ENCOUNTER — Ambulatory Visit: Payer: Managed Care, Other (non HMO) | Admitting: Physical Therapy

## 2015-10-27 ENCOUNTER — Encounter: Payer: Self-pay | Admitting: Physical Therapy

## 2015-10-27 DIAGNOSIS — R262 Difficulty in walking, not elsewhere classified: Secondary | ICD-10-CM | POA: Diagnosis not present

## 2015-10-27 DIAGNOSIS — M25662 Stiffness of left knee, not elsewhere classified: Secondary | ICD-10-CM

## 2015-10-27 DIAGNOSIS — M25562 Pain in left knee: Secondary | ICD-10-CM

## 2015-10-27 DIAGNOSIS — M25462 Effusion, left knee: Secondary | ICD-10-CM

## 2015-10-27 NOTE — Therapy (Signed)
[facility] [place] [address] [place], [place], [address] Phone: [phone]   Fax:  [phone]  Physical Therapy Treatment  Patient Details  Name: Tim [name] MRN: [id number] Date of Birth: Dec 15, 1956 No Data Recorded  Encounter Date: 10/27/2015      PT End of Session - 10/27/15 1231    Visit Number 25   Date for PT Re-Evaluation 10/20/15   PT Start Time 1152   PT Stop Time 1231   PT Time Calculation (min) 39 min      Past Medical History  Diagnosis Date  . Hypertension   . Arthritis     Past Surgical History  Procedure Laterality Date  . Total knee arthroplasty Left 07/28/2015    Procedure: LEFT TOTAL KNEE ARTHROPLASTY;  Surgeon: Ollen Gross, MD;  [facility];  Service: Orthopedics;  Laterality: Left;  . Knee closed reduction Left 10/13/2015    Procedure: LEFT CLOSED MANIPULATION KNEE;  Surgeon: Ollen Gross, MD;  [facility];  Service: Orthopedics;  Laterality: Left;    There were no vitals filed for this visit.  Visit Diagnosis:  Swelling of left knee joint  Knee stiffness, left  Left knee pain  Difficulty walking      Subjective Assessment - 10/27/15 1154    Subjective Pt reports working out this morning   Currently in Pain? No/denies   Pain Score 0-No pain                         [facility] Adult PT Treatment/Exercise - 10/27/15 0001    Knee/Hip Exercises: Aerobic   Elliptical I 20 R5 81fwd/3back   Recumbent Bike 3 min  able to make full rev.   Knee/Hip Exercises: Machines for Strengthening   Cybex Knee Extension 15 # 2 sets RLE  15   Cybex Knee Flexion 45# left leg only 2 sets 15 reps    Cybex Leg Press LLE #40 L7 x15    Knee/Hip Exercises: Standing   Forward Step Up Left;10 reps;Step Height: 8";Hand Hold: 1;1 set   Other Standing Knee Exercises squats 2x10x 3 sec hold   Other Standing Knee Exercises cable pull down 190 # 2 sets 15                  PT  Short Term Goals - 08/28/15 1055    PT SHORT TERM GOAL #1   Title independent with initial HEP   Status Achieved           PT Long Term Goals - 10/24/15 1149    PT LONG TERM GOAL #2   Title decrease pain 50%   Status Achieved   PT LONG TERM GOAL #4   Title go up and down stairs reciprocally   Status Achieved               Plan - 10/27/15 1232    Clinical Impression Statement Pt 7 minutes late for PT treatment this date. Reports that he has been working out earlier this date. Pt reports that his MD advised him not to try dry needling because it has been known to cause infection in TKR. Great effort this date during treatment , ROM remains limited in L knee   Pt will benefit from skilled therapeutic intervention in order to improve on the following deficits Abnormal gait;Decreased mobility;Decreased range of motion;Difficulty walking;Decreased strength;Increased edema;Pain   Rehab Potential Good   PT Frequency 3x /  week   PT Duration 8 weeks   PT Treatment/Interventions Electrical Stimulation;Cryotherapy;Gait training;Neuromuscular re-education;Therapeutic exercise;Therapeutic activities;Functional mobility training;Stair training;Patient/family education;Manual techniques   PT Next Visit Plan aggressive ROM        Problem List Patient Active Problem List   Diagnosis Date Noted  . Postoperative stiffness of total knee replacement ([facility]) 10/12/2015  . OA (osteoarthritis) of knee 07/28/2015    Grayce Sessionsonald G Jule Schlabach, PTA  10/27/2015, 12:35 PM  [facility] [place] [place] [address] [place], [place], [address] Phone: [phone]   Fax:  [phone]  Name: Tim [name] MRN: [id number] Date of Birth: 1956/11/16

## 2015-10-28 ENCOUNTER — Ambulatory Visit: Payer: Managed Care, Other (non HMO) | Admitting: Physical Therapy

## 2015-10-29 ENCOUNTER — Encounter: Payer: Self-pay | Admitting: Physical Therapy

## 2015-10-29 ENCOUNTER — Ambulatory Visit: Payer: Managed Care, Other (non HMO) | Admitting: Physical Therapy

## 2015-10-29 DIAGNOSIS — M25462 Effusion, left knee: Secondary | ICD-10-CM

## 2015-10-29 DIAGNOSIS — M25562 Pain in left knee: Secondary | ICD-10-CM

## 2015-10-29 DIAGNOSIS — R262 Difficulty in walking, not elsewhere classified: Secondary | ICD-10-CM | POA: Diagnosis not present

## 2015-10-29 DIAGNOSIS — M25662 Stiffness of left knee, not elsewhere classified: Secondary | ICD-10-CM

## 2015-10-29 NOTE — Therapy (Signed)
[facility] [address] [place], [place], [address] Phone: [phone]   Fax:  [phone]  Physical Therapy Treatment  Patient Details  Name: Tim [name] MRN: [id number] Date of Birth: Sep 27, 1956 No Data Recorded  Encounter Date: 10/29/2015      PT End of Session - 10/29/15 1437    Visit Number 26   Date for PT Re-Evaluation 10/20/15   PT Start Time 1350   PT Stop Time 1439   PT Time Calculation (min) 49 min      Past Medical History  Diagnosis Date  . Hypertension   . Arthritis     Past Surgical History  Procedure Laterality Date  . Total knee arthroplasty Left 07/28/2015    Procedure: LEFT TOTAL KNEE ARTHROPLASTY;  Surgeon: Ollen GrossFrank Aluisio, MD;  [facility];  Service: Orthopedics;  Laterality: Left;  . Knee closed reduction Left 10/13/2015    Procedure: LEFT CLOSED MANIPULATION KNEE;  Surgeon: Ollen GrossFrank Aluisio, MD;  [facility];  Service: Orthopedics;  Laterality: Left;    There were no vitals filed for this visit.  Visit Diagnosis:  Knee stiffness, left  Left knee pain  Difficulty walking  Swelling of left knee joint      Subjective Assessment - 10/29/15 1354    Subjective Md appointment this morning, MD ok with L knee flexion, needs to work harder on ext. Pt reports that he is ready to work and feels fine.    Currently in Pain? No/denies   Pain Score 0-No pain                         [facility] Adult PT Treatment/Exercise - 10/29/15 0001    Ambulation/Gait   Gait Comments stairs reciprocally up and down x2  skip step with LLE on ascends; Resisted gait in all directions.   Knee/Hip Exercises: Aerobic   Elliptical I 20 R10 193fwd/3back   Knee/Hip Exercises: Machines for Strengthening   Cybex Knee Extension 15 # 2 sets RLE  15   Cybex Knee Flexion 45# left leg only 2 sets 15 reps    Cybex Leg Press LLE #40 L8 x20    Knee/Hip Exercises: Standing   Forward Step Up Left;10  reps;Step Height: 8";Hand Hold: 1;1 set  explosive    Other Standing Knee Exercises squats x10 3 sec hold   Other Standing Knee Exercises cable pull down 190 # 2 sets 15                  PT Short Term Goals - 08/28/15 1055    PT SHORT TERM GOAL #1   Title independent with initial HEP   Status Achieved           PT Long Term Goals - 10/24/15 1149    PT LONG TERM GOAL #2   Title decrease pain 50%   Status Achieved   PT LONG TERM GOAL #4   Title go up and down stairs reciprocally   Status Achieved               Plan - 10/29/15 1439    Clinical Impression Statement Md pleased with pt progress regarding L knee flexion. Md would like Pt to gain moe L knee ext per Pt report. Completed all interventions this date. Able to perform resisted gait but unsteady at times.   Pt will benefit from skilled therapeutic intervention in order to improve on the following deficits  Abnormal gait;Decreased mobility;Decreased range of motion;Difficulty walking;Decreased strength;Increased edema;Pain   Rehab Potential Good   PT Frequency 3x / week   PT Duration 8 weeks   PT Treatment/Interventions Electrical Stimulation;Cryotherapy;Gait training;Neuromuscular re-education;Therapeutic exercise;Therapeutic activities;Functional mobility training;Stair training;Patient/family education;Manual techniques   PT Next Visit Plan L knee ext, progress with resisted gait        Problem List Patient Active Problem List   Diagnosis Date Noted  . Postoperative stiffness of total knee replacement ([facility]) 10/12/2015  . OA (osteoarthritis) of knee 07/28/2015    Grayce Sessions, PTA  10/29/2015, 2:43 PM  [facility] [place] [address] [place], [place], [address] Phone: [phone]   Fax:  [phone]  Name: Tim [name] MRN: [id number] Date of Birth: 04/25/1956

## 2015-10-31 ENCOUNTER — Ambulatory Visit: Payer: Managed Care, Other (non HMO) | Admitting: Physical Therapy

## 2015-10-31 ENCOUNTER — Encounter: Payer: Self-pay | Admitting: Physical Therapy

## 2015-10-31 DIAGNOSIS — M25462 Effusion, left knee: Secondary | ICD-10-CM

## 2015-10-31 DIAGNOSIS — M25562 Pain in left knee: Secondary | ICD-10-CM

## 2015-10-31 DIAGNOSIS — R262 Difficulty in walking, not elsewhere classified: Secondary | ICD-10-CM

## 2015-10-31 DIAGNOSIS — M25662 Stiffness of left knee, not elsewhere classified: Secondary | ICD-10-CM

## 2015-10-31 NOTE — Therapy (Signed)
[facility] [place] [address] [place], [place], [address] Phone: [phone]   Fax:  [phone]  Physical Therapy Treatment  Patient Details  Name: Tim [name] MRN: [id number] Date of Birth: 01/22/1956 No Data Recorded  Encounter Date: 10/31/2015      PT End of Session - 10/31/15 1019    Visit Number 27   Date for PT Re-Evaluation 10/20/15   PT Start Time 0932   PT Stop Time 1019   PT Time Calculation (min) 47 min   Activity Tolerance Patient tolerated treatment well   Behavior During Therapy [facility] for tasks assessed/performed      Past Medical History  Diagnosis Date  . Hypertension   . Arthritis     Past Surgical History  Procedure Laterality Date  . Total knee arthroplasty Left 07/28/2015    Procedure: LEFT TOTAL KNEE ARTHROPLASTY;  Surgeon: Ollen Gross, MD;  [facility]: [facility];  Service: Orthopedics;  Laterality: Left;  . Knee closed reduction Left 10/13/2015    Procedure: LEFT CLOSED MANIPULATION KNEE;  Surgeon: Ollen Gross, MD;  Location: [facility];  Service: Orthopedics;  Laterality: Left;    There were no vitals filed for this visit.  Visit Diagnosis:  Swelling of left knee joint  Difficulty walking  Left knee pain  Knee stiffness, left      Subjective Assessment - 10/31/15 0933    Subjective "Im good"   Currently in Pain? No/denies   Pain Score 0-No pain                         [facility] Adult PT Treatment/Exercise - 10/31/15 0001    Ambulation/Gait   Gait Comments stairs reciprocally up and down x2  skip step with LLE on ascends; Resisted gait in all directions.   Knee/Hip Exercises: Stretches   Other Knee/Hip Stretches Lunge stretch 3 sec 10 reps   Knee/Hip Exercises: Aerobic   Elliptical I 20 R10 36fwd/4back   Recumbent Bike 3 min  able to make full rev.   Knee/Hip Exercises: Machines for Strengthening   Cybex Knee Extension 15 # 2 sets RLE  15   Cybex Knee Flexion 45#  left leg only 2 sets 15 reps    Cybex Leg Press LLE #40 L8 x20    Knee/Hip Exercises: Standing   Other Standing Knee Exercises squats x10 3 sec hold   Manual Therapy   Manual Therapy Soft tissue mobilization   Soft tissue mobilization Pt prone, LE off table #5 on R ankle to promote EXT. STM to L hamstring with low load long duration stretch.                  PT Short Term Goals - 08/28/15 1055    PT SHORT TERM GOAL #1   Title independent with initial HEP   Status Achieved           PT Long Term Goals - 10/24/15 1149    PT LONG TERM GOAL #2   Title decrease pain 50%   Status Achieved   PT LONG TERM GOAL #4   Title go up and down stairs reciprocally   Status Achieved               Plan - 10/31/15 1023    Clinical Impression Statement STM focused on Ext this date, Pt reports that it felt looser. Still unsteady with resistant side step. Great effort throughout without all interventions.  Pt will benefit from skilled therapeutic intervention in order to improve on the following deficits Abnormal gait;Decreased mobility;Decreased range of motion;Difficulty walking;Decreased strength;Increased edema;Pain   Rehab Potential Good   PT Frequency 3x / week   PT Duration 8 weeks   PT Treatment/Interventions Electrical Stimulation;Cryotherapy;Gait training;Neuromuscular re-education;Therapeutic exercise;Therapeutic activities;Functional mobility training;Stair training;Patient/family education;Manual techniques   PT Next Visit Plan L knee ext, progress with resisted gait        Problem List Patient Active Problem List   Diagnosis Date Noted  . Postoperative stiffness of total knee replacement (HCC) 10/12/2015  . OA (osteoarthritis) of knee 07/28/2015    Grayce Sessionsonald G Berdene Askari, PTA  10/31/2015, 10:25 AM  [facility] [place] [address] [place], [place], [address] Phone: [phone]   Fax:   [phone]  Name: Tim [name] MRN: [id number] Date of Birth: 10-16-1956

## 2015-11-03 ENCOUNTER — Encounter: Payer: Self-pay | Admitting: Physical Therapy

## 2015-11-03 ENCOUNTER — Ambulatory Visit: Payer: Managed Care, Other (non HMO) | Admitting: Physical Therapy

## 2015-11-03 DIAGNOSIS — M25562 Pain in left knee: Secondary | ICD-10-CM

## 2015-11-03 DIAGNOSIS — R262 Difficulty in walking, not elsewhere classified: Secondary | ICD-10-CM | POA: Diagnosis not present

## 2015-11-03 DIAGNOSIS — M25462 Effusion, left knee: Secondary | ICD-10-CM

## 2015-11-03 DIAGNOSIS — M25662 Stiffness of left knee, not elsewhere classified: Secondary | ICD-10-CM

## 2015-11-03 NOTE — Therapy (Signed)
[facility] [place] [address] [place], [place], [address] Phone: [phone]   Fax:  [phone]  Physical Therapy Treatment  Patient Details  Name: Tim [name] MRN: [id number] Date of Birth: 08/21/1956 No Data Recorded  Encounter Date: 11/03/2015      PT End of Session - 11/03/15 0934    Visit Number 28   Date for PT Re-Evaluation 10/20/15   PT Start Time 0851   PT Stop Time 0949   PT Time Calculation (min) 58 min      Past Medical History  Diagnosis Date  . Hypertension   . Arthritis     Past Surgical History  Procedure Laterality Date  . Total knee arthroplasty Left 07/28/2015    Procedure: LEFT TOTAL KNEE ARTHROPLASTY;  Surgeon: Ollen Gross, MD;  [facility];  Service: Orthopedics;  Laterality: Left;  . Knee closed reduction Left 10/13/2015    Procedure: LEFT CLOSED MANIPULATION KNEE;  Surgeon: Ollen Gross, MD;  [facility];  Service: Orthopedics;  Laterality: Left;    There were no vitals filed for this visit.  Visit Diagnosis:  Knee stiffness, left  Left knee pain  Difficulty walking  Swelling of left knee joint      Subjective Assessment - 11/03/15 0852    Subjective "Rough weekend, take it easy on me"   Currently in Pain? No/denies   Pain Score 0-No pain                         [facility] Adult PT Treatment/Exercise - 11/03/15 0001    Ambulation/Gait   Gait Comments stairs reciprocally up and down x4  skip step with LLE on ascends; Resisted gait in all directions.   Knee/Hip Exercises: Aerobic   Elliptical I 20 R10 98fwd/3back   Recumbent Bike 3 min  able to make full rev.   Knee/Hip Exercises: Machines for Strengthening   Cybex Knee Extension 20 # 2 sets RLE  15   Cybex Knee Flexion 45# left leg only 2 sets 15 reps    Cybex Leg Press LLE #40 L8 x20    Knee/Hip Exercises: Standing   Other Standing Knee Exercises squats x10 3 sec hold   Other Standing Knee  Exercises cable pull down 190 # x20   Cryotherapy   Number Minutes Cryotherapy 15 Minutes   Cryotherapy Location Knee   Vasopneumatic   Number Minutes Vasopneumatic  15 minutes   Vasopnuematic Location  Knee   Vasopneumatic Pressure High   Vasopneumatic Temperature  32                  PT Short Term Goals - 08/28/15 1055    PT SHORT TERM GOAL #1   Title independent with initial HEP   Status Achieved           PT Long Term Goals - 10/24/15 1149    PT LONG TERM GOAL #2   Title decrease pain 50%   Status Achieved   PT LONG TERM GOAL #4   Title go up and down stairs reciprocally   Status Achieved               Plan - 11/03/15 0935    Clinical Impression Statement Pt  6 minutes late for PT. L knee ROM still resisted with tight end points. Better balance with resisted side steps. Continues to give good effort in PT.   Pt will benefit from skilled  therapeutic intervention in order to improve on the following deficits Abnormal gait;Decreased mobility;Decreased range of motion;Difficulty walking;Decreased strength;Increased edema;Pain   Rehab Potential Good   PT Frequency 3x / week   PT Duration 8 weeks   PT Treatment/Interventions Electrical Stimulation;Cryotherapy;Gait training;Neuromuscular re-education;Therapeutic exercise;Therapeutic activities;Functional mobility training;Stair training;Patient/family education;Manual techniques   PT Next Visit Plan L knee ext, progress with resisted gait        Problem List Patient Active Problem List   Diagnosis Date Noted  . Postoperative stiffness of total knee replacement (HCC) 10/12/2015  . OA (osteoarthritis) of knee 07/28/2015    Grayce Sessionsonald G Ephraim Reichel, PTA  11/03/2015, 9:52 AM  [facility] [place] [address] [place], [place], [address] Phone: [phone]   Fax:  [phone]  Name: Kathleene Hazellonzo Steinhilber MRN: [id number] Date of Birth: [birth year]

## 2015-11-05 ENCOUNTER — Ambulatory Visit: Payer: Managed Care, Other (non HMO) | Admitting: Physical Therapy

## 2015-11-05 ENCOUNTER — Encounter: Payer: Self-pay | Admitting: Physical Therapy

## 2015-11-05 DIAGNOSIS — M25562 Pain in left knee: Secondary | ICD-10-CM

## 2015-11-05 DIAGNOSIS — R262 Difficulty in walking, not elsewhere classified: Secondary | ICD-10-CM

## 2015-11-05 DIAGNOSIS — M25662 Stiffness of left knee, not elsewhere classified: Secondary | ICD-10-CM

## 2015-11-05 DIAGNOSIS — M25462 Effusion, left knee: Secondary | ICD-10-CM

## 2015-11-05 NOTE — Therapy (Signed)
[facility] [place] [address] [place], [place], [address] Phone: [phone]   Fax:  [phone]  Physical Therapy Treatment  Patient Details  Name: Tim [name] MRN: [id number] Date of Birth: January 25, 1956 No Data Recorded  Encounter Date: 11/05/2015      PT End of Session - 11/05/15 1015    Visit Number 29   Date for PT Re-Evaluation 10/20/15   PT Start Time 0931   PT Stop Time 1017   PT Time Calculation (min) 46 min      Past Medical History  Diagnosis Date  . Hypertension   . Arthritis     Past Surgical History  Procedure Laterality Date  . Total knee arthroplasty Left 07/28/2015    Procedure: LEFT TOTAL KNEE ARTHROPLASTY;  Surgeon: Ollen GrossFrank Aluisio, MD;  [facility];  Service: Orthopedics;  Laterality: Left;  . Knee closed reduction Left 10/13/2015    Procedure: LEFT CLOSED MANIPULATION KNEE;  Surgeon: Ollen GrossFrank Aluisio, MD;  [facility];  Service: Orthopedics;  Laterality: Left;    There were no vitals filed for this visit.  Visit Diagnosis:  Difficulty walking  Swelling of left knee joint  Left knee pain  Knee stiffness, left      Subjective Assessment - 11/05/15 0939    Subjective "Things going back fine" Return to work Dec 5    Currently in Pain? No/denies   Pain Score 0-No pain                         [facility] Adult PT Treatment/Exercise - 11/05/15 0001    Ambulation/Gait   Gait Comments stairs reciprocally up and down x3 skip step with LLE on ascends; Resisted gait in all directions.   Knee/Hip Exercises: Aerobic   Elliptical I 20 R10 634fwd/4back   Recumbent Bike 3 min  able to make full rev.   Knee/Hip Exercises: Machines for Strengthening   Cybex Knee Extension 25 # 2 sets RLE  15   Cybex Knee Flexion 45# left leg only 2 sets 15 reps    Cybex Leg Press LLE #40 L8 x20    Knee/Hip Exercises: Standing   Other Standing Knee Exercises squats 2x10 3 sec hold                   PT Short Term Goals - 08/28/15 1055    PT SHORT TERM GOAL #1   Title independent with initial HEP   Status Achieved           PT Long Term Goals - 10/24/15 1149    PT LONG TERM GOAL #2   Title decrease pain 50%   Status Achieved   PT LONG TERM GOAL #4   Title go up and down stairs reciprocally   Status Achieved               Plan - 11/05/15 1015    Clinical Impression Statement Despite limited L knee ROM pt continues to work hard during PT. No issues with resisted gait this date. All exercises performed [facility].   Pt will benefit from skilled therapeutic intervention in order to improve on the following deficits Abnormal gait;Decreased mobility;Decreased range of motion;Difficulty walking;Decreased strength;Increased edema;Pain   Rehab Potential Good   PT Frequency 3x / week   PT Duration 8 weeks   PT Treatment/Interventions Electrical Stimulation;Cryotherapy;Gait training;Neuromuscular re-education;Therapeutic exercise;Therapeutic activities;Functional mobility training;Stair training;Patient/family education;Manual techniques   PT Next Visit Plan L  knee ext, progress with resisted gait        Problem List Patient Active Problem List   Diagnosis Date Noted  . Postoperative stiffness of total knee replacement (HCC) 10/12/2015  . OA (osteoarthritis) of knee 07/28/2015    Tim [name], PTA  11/05/2015, 10:19 AM  [facility] [place] [address] [place], [place], [address] Phone: [phone]   Fax:  [phone]  Name: Tim [name] MRN: [id number] Date of Birth: 19-Aug-1956

## 2015-11-06 ENCOUNTER — Encounter: Payer: Managed Care, Other (non HMO) | Admitting: Physical Therapy

## 2015-11-07 ENCOUNTER — Ambulatory Visit: Payer: Managed Care, Other (non HMO) | Admitting: Physical Therapy

## 2015-11-07 ENCOUNTER — Encounter: Payer: Self-pay | Admitting: Physical Therapy

## 2015-11-07 DIAGNOSIS — M25562 Pain in left knee: Secondary | ICD-10-CM

## 2015-11-07 DIAGNOSIS — M25462 Effusion, left knee: Secondary | ICD-10-CM

## 2015-11-07 DIAGNOSIS — R262 Difficulty in walking, not elsewhere classified: Secondary | ICD-10-CM | POA: Diagnosis not present

## 2015-11-07 DIAGNOSIS — M25662 Stiffness of left knee, not elsewhere classified: Secondary | ICD-10-CM

## 2015-11-07 NOTE — Therapy (Signed)
[facility] [place] [address] [place], [place], [address] Phone: [phone]   Fax:  [phone]  Physical Therapy Treatment  Patient Details  Name: Kathleene Hazellonzo Cozad MRN: [id number] Date of Birth: 22-Feb-1956 No Data Recorded  Encounter Date: 11/07/2015      PT End of Session - 11/07/15 0927    Visit Number 30   PT Start Time 0850   PT Stop Time 0940   PT Time Calculation (min) 50 min      Past Medical History  Diagnosis Date  . Hypertension   . Arthritis     Past Surgical History  Procedure Laterality Date  . Total knee arthroplasty Left 07/28/2015    Procedure: LEFT TOTAL KNEE ARTHROPLASTY;  Surgeon: Ollen GrossFrank Aluisio, MD;  [facility];  Service: Orthopedics;  Laterality: Left;  . Knee closed reduction Left 10/13/2015    Procedure: LEFT CLOSED MANIPULATION KNEE;  Surgeon: Ollen GrossFrank Aluisio, MD;  [facility]: [facility];  Service: Orthopedics;  Laterality: Left;    There were no vitals filed for this visit.  Visit Diagnosis:  Difficulty walking  Swelling of left knee joint  Left knee pain  Knee stiffness, left      Subjective Assessment - 11/07/15 0852    Currently in Pain? Yes   Pain Score 1    Pain Location Knee   Pain Orientation Left   Pain Descriptors / Indicators Tightness   Pain Type Surgical pain                         [facility] Adult PT Treatment/Exercise - 11/07/15 0001    Knee/Hip Exercises: Aerobic   Elliptical I 20 R10 324fwd/4back   Knee/Hip Exercises: Machines for Strengthening   Other Machine 90# pulley leg press    Knee/Hip Exercises: Standing   Lateral Step Up Both;2 sets;10 reps  heel tap, step down    Step Down 2 sets;Right  8" up and over    Wall Squat 10 reps;10 seconds   Vasopneumatic   Number Minutes Vasopneumatic  15 minutes   Vasopnuematic Location  Knee   Vasopneumatic Pressure High   Vasopneumatic Temperature  36   Manual Therapy   Manual Therapy Joint  mobilization;Passive ROM   Manual therapy comments Knee flexion    Joint Mobilization patellar mobes, anterior posterior knee mobes                   PT Short Term Goals - 08/28/15 1055    PT SHORT TERM GOAL #1   Title independent with initial HEP   Status Achieved           PT Long Term Goals - 10/24/15 1149    PT LONG TERM GOAL #2   Title decrease pain 50%   Status Achieved   PT LONG TERM GOAL #4   Title go up and down stairs reciprocally   Status Achieved               Plan - 11/07/15 0931    Clinical Impression Statement Pt continues to be limited with L knee flexion. Performed patellar mobes, very limited patellar mobility present in L knee.  Pt challenged with 8" step downs, unable to maintain heel contact with step on descent due to limited ROM.    PT Next Visit Plan Add more patellar mobes and exercises that force knee flexion        Problem List Patient Active Problem  List   Diagnosis Date Noted  . Postoperative stiffness of total knee replacement (HCC) 10/12/2015  . OA (osteoarthritis) of knee 07/28/2015    Meyer Russel, SPTA 11/07/2015, 9:35 AM  [facility] [place] [address] [place], [place], [address] Phone: [phone]   Fax:  [phone]  Name: Hasten Sweitzer MRN: [id number] Date of Birth: 02/27/1956

## 2015-11-11 ENCOUNTER — Encounter: Payer: Self-pay | Admitting: Physical Therapy

## 2015-11-11 ENCOUNTER — Ambulatory Visit: Payer: Managed Care, Other (non HMO) | Admitting: Physical Therapy

## 2015-11-11 DIAGNOSIS — M25562 Pain in left knee: Secondary | ICD-10-CM

## 2015-11-11 DIAGNOSIS — R262 Difficulty in walking, not elsewhere classified: Secondary | ICD-10-CM | POA: Diagnosis not present

## 2015-11-11 DIAGNOSIS — M25462 Effusion, left knee: Secondary | ICD-10-CM

## 2015-11-11 DIAGNOSIS — M25662 Stiffness of left knee, not elsewhere classified: Secondary | ICD-10-CM

## 2015-11-11 NOTE — Therapy (Signed)
[facility] [place] [address] [place], [place], [address] Phone: [phone]   Fax:  [phone]  Physical Therapy Treatment  Patient Details  Name: Tim [name] MRN: [id number] Date of Birth: 06/26/1956 No Data Recorded  Encounter Date: 11/11/2015      PT End of Session - 11/11/15 1014    Visit Number 31   Date for PT Re-Evaluation 12/05/15   PT Start Time 0932   PT Stop Time 1014   PT Time Calculation (min) 42 min      Past Medical History  Diagnosis Date  . Hypertension   . Arthritis     Past Surgical History  Procedure Laterality Date  . Total knee arthroplasty Left 07/28/2015    Procedure: LEFT TOTAL KNEE ARTHROPLASTY;  Surgeon: Ollen GrossFrank Aluisio, MD;  [facility];  Service: Orthopedics;  Laterality: Left;  . Knee closed reduction Left 10/13/2015    Procedure: LEFT CLOSED MANIPULATION KNEE;  Surgeon: Ollen GrossFrank Aluisio, MD;  [facility];  Service: Orthopedics;  Laterality: Left;    There were no vitals filed for this visit.  Visit Diagnosis:  Difficulty walking  Swelling of left knee joint  Left knee pain  Knee stiffness, left      Subjective Assessment - 11/11/15 0933    Subjective "Good, no pain medacine"   Currently in Pain? No/denies   Pain Score 0-No pain            [facility] PT Assessment - 11/11/15 0001    AROM   Left Knee Extension 10   Left Knee Flexion 92                     [facility] Adult PT Treatment/Exercise - 11/11/15 0001    Ambulation/Gait   Gait Comments stairs reciprocally up and down x3 skip step with LLE on ascends; Resisted gait in all directions.   Knee/Hip Exercises: Aerobic   Elliptical I 20 R10 604fwd/4back   Recumbent Bike 3 min  able to make full rev.   Knee/Hip Exercises: Machines for Strengthening   Cybex Knee Flexion 45# left leg only 2 sets 15 reps    Knee/Hip Exercises: Standing   Step Down 2 sets;Right  up and over    Other Standing Knee  Exercises squats x10 3 sec hold   Other Standing Knee Exercises step up and overs on BOSU x10                   PT Short Term Goals - 08/28/15 1055    PT SHORT TERM GOAL #1   Title independent with initial HEP   Status Achieved           PT Long Term Goals - 10/24/15 1149    PT LONG TERM GOAL #2   Title decrease pain 50%   Status Achieved   PT LONG TERM GOAL #4   Title go up and down stairs reciprocally   Status Achieved               Plan - 11/11/15 1014    Clinical Impression Statement L knee ROM continues to remain limited but pt is pleased with functional status. He reports that he will return to MD next Tuesday. No issues with today's interventions. Minor balance issue with step ups on BOSU   Pt will benefit from skilled therapeutic intervention in order to improve on the following deficits Abnormal gait;Decreased mobility;Decreased range of motion;Difficulty walking;Decreased strength;Increased edema;Pain  Rehab Potential Good   PT Frequency 3x / week   PT Duration 8 weeks   PT Treatment/Interventions Electrical Stimulation;Cryotherapy;Gait training;Neuromuscular re-education;Therapeutic exercise;Therapeutic activities;Functional mobility training;Stair training;Patient/family education;Manual techniques        Problem List Patient Active Problem List   Diagnosis Date Noted  . Postoperative stiffness of total knee replacement (HCC) 10/12/2015  . OA (osteoarthritis) of knee 07/28/2015    Grayce Sessions, PTA  11/11/2015, 10:18 AM  [facility] [place] [address] [place], [place], [address] Phone: [phone]   Fax:  [phone]  Name: Tim [name] MRN: [id number] Date of Birth: 18-Dec-1956

## 2015-11-17 ENCOUNTER — Ambulatory Visit: Payer: Managed Care, Other (non HMO) | Admitting: Physical Therapy

## 2015-11-17 ENCOUNTER — Encounter: Payer: Self-pay | Admitting: Physical Therapy

## 2015-11-17 DIAGNOSIS — R262 Difficulty in walking, not elsewhere classified: Secondary | ICD-10-CM

## 2015-11-17 DIAGNOSIS — M25462 Effusion, left knee: Secondary | ICD-10-CM

## 2015-11-17 DIAGNOSIS — M25562 Pain in left knee: Secondary | ICD-10-CM

## 2015-11-17 DIAGNOSIS — M25662 Stiffness of left knee, not elsewhere classified: Secondary | ICD-10-CM

## 2015-11-17 NOTE — Therapy (Signed)
[facility] [place] [address] [place], [place], [address] Phone: [phone]   Fax:  [phone]  Physical Therapy Treatment  Patient Details  Name: Tim [name] MRN: [id number] Date of Birth: 1956-06-13 No Data Recorded  Encounter Date: 11/17/2015      PT End of Session - 11/17/15 1144    Visit Number 32   Date for PT Re-Evaluation 12/05/15   PT Start Time 1100   PT Stop Time 1145   PT Time Calculation (min) 45 min   Activity Tolerance Patient tolerated treatment well   Behavior During Therapy [facility] for tasks assessed/performed      Past Medical History  Diagnosis Date  . Hypertension   . Arthritis     Past Surgical History  Procedure Laterality Date  . Total knee arthroplasty Left 07/28/2015    Procedure: LEFT TOTAL KNEE ARTHROPLASTY;  Surgeon: Ollen Gross, MD;  [facility];  Service: Orthopedics;  Laterality: Left;  . Knee closed reduction Left 10/13/2015    Procedure: LEFT CLOSED MANIPULATION KNEE;  Surgeon: Ollen Gross, MD;  [facility];  Service: Orthopedics;  Laterality: Left;    There were no vitals filed for this visit.  Visit Diagnosis:  Swelling of left knee joint  Left knee pain  Knee stiffness, left  Difficulty walking      Subjective Assessment - 11/17/15 1106    Subjective "Im good" "I did pretty good at the game walking up and down the steps"   Currently in Pain? No/denies   Pain Score 0-No pain            [facility] PT Assessment - 11/17/15 0001    AROM   Left Knee Extension 10   Left Knee Flexion 92                     [facility] Adult PT Treatment/Exercise - 11/17/15 0001    Ambulation/Gait   Gait Comments stairs reciprocally up and down x4 skip step with LLE on ascends   Knee/Hip Exercises: Aerobic   Elliptical I 20 R10 41fwd/4back   Recumbent Bike 3 min  able to make full rev.   Knee/Hip Exercises: Machines for Strengthening   Cybex Knee Extension 25 # 2  sets RLE  15   Cybex Knee Flexion 45# left leg only 2 sets 15 reps    Knee/Hip Exercises: Standing   Lateral Step Up Both;1 set;15 reps   Step Down 2 sets;10 reps;Step Height: 8";Left  up and over    Other Standing Knee Exercises squats x10 3 sec hold   Other Standing Knee Exercises step up and overs on BOSU x10; Rev step ups with LLE 8in X10     Knee/Hip Exercises: Seated   Sit to Sand 15 reps;without UE support  from chair                   PT Short Term Goals - 08/28/15 1055    PT SHORT TERM GOAL #1   Title independent with initial HEP   Status Achieved           PT Long Term Goals - 10/24/15 1149    PT LONG TERM GOAL #2   Title decrease pain 50%   Status Achieved   PT LONG TERM GOAL #4   Title go up and down stairs reciprocally   Status Achieved               Plan -  11/17/15 1145    Clinical Impression Statement Pt L knee ROM has seem to plato, No gain in AROM but pt very pleased with the function of L knee. Pt completed all interventions well and reports increases functional mobility compared to before surgery. Pt reports to MD tomorrow.   Pt will benefit from skilled therapeutic intervention in order to improve on the following deficits Abnormal gait;Decreased mobility;Decreased range of motion;Difficulty walking;Decreased strength;Increased edema;Pain   Rehab Potential Good   PT Frequency 3x / week   PT Duration 8 weeks   PT Treatment/Interventions Electrical Stimulation;Cryotherapy;Gait training;Neuromuscular re-education;Therapeutic exercise;Therapeutic activities;Functional mobility training;Stair training;Patient/family education;Manual techniques   PT Next Visit Plan Possible D/C        Problem List Patient Active Problem List   Diagnosis Date Noted  . Postoperative stiffness of total knee replacement (HCC) 10/12/2015  . OA (osteoarthritis) of knee 07/28/2015    Grayce Sessionsonald G Nerea Bordenave, PTA  11/17/2015, 11:48 AM  [facility] [place] [address] [place], [place], [address] Phone: [phone]   Fax:  [phone]  Name: Tim [name] MRN: [id number] Date of Birth: [birth year]

## 2018-11-13 ENCOUNTER — Other Ambulatory Visit (HOSPITAL_COMMUNITY): Payer: Self-pay | Admitting: Internal Medicine

## 2018-11-13 DIAGNOSIS — E0590 Thyrotoxicosis, unspecified without thyrotoxic crisis or storm: Secondary | ICD-10-CM

## 2018-11-15 ENCOUNTER — Other Ambulatory Visit: Payer: Self-pay | Admitting: Internal Medicine

## 2018-11-15 DIAGNOSIS — E0590 Thyrotoxicosis, unspecified without thyrotoxic crisis or storm: Secondary | ICD-10-CM

## 2018-11-21 ENCOUNTER — Ambulatory Visit
Admission: RE | Admit: 2018-11-21 | Discharge: 2018-11-21 | Disposition: A | Discharge status: Home / Self Care | Payer: Managed Care, Other (non HMO) | Source: Ambulatory Visit | Attending: Internal Medicine | Admitting: Internal Medicine

## 2018-11-21 DIAGNOSIS — E0590 Thyrotoxicosis, unspecified without thyrotoxic crisis or storm: Secondary | ICD-10-CM

## 2018-11-30 ENCOUNTER — Other Ambulatory Visit (HOSPITAL_COMMUNITY): Payer: Managed Care, Other (non HMO)

## 2018-11-30 ENCOUNTER — Ambulatory Visit (HOSPITAL_COMMUNITY): Payer: Managed Care, Other (non HMO)

## 2018-12-01 ENCOUNTER — Other Ambulatory Visit (HOSPITAL_COMMUNITY): Payer: Managed Care, Other (non HMO)

## 2018-12-04 ENCOUNTER — Other Ambulatory Visit (HOSPITAL_COMMUNITY): Payer: Managed Care, Other (non HMO)

## 2018-12-04 ENCOUNTER — Ambulatory Visit (HOSPITAL_COMMUNITY): Payer: Managed Care, Other (non HMO)

## 2018-12-05 ENCOUNTER — Other Ambulatory Visit (HOSPITAL_COMMUNITY): Payer: Managed Care, Other (non HMO)

## 2018-12-20 DIAGNOSIS — E109 Type 1 diabetes mellitus without complications: Secondary | ICD-10-CM | POA: Diagnosis not present

## 2018-12-26 ENCOUNTER — Ambulatory Visit (HOSPITAL_COMMUNITY)
Admission: RE | Admit: 2018-12-26 | Discharge: 2018-12-26 | Disposition: A | Discharge status: Home / Self Care | Payer: Commercial Managed Care - PPO | Source: Ambulatory Visit | Attending: Internal Medicine | Admitting: Internal Medicine

## 2018-12-26 ENCOUNTER — Encounter (HOSPITAL_COMMUNITY)
Admission: RE | Admit: 2018-12-26 | Discharge: 2018-12-26 | Disposition: A | Discharge status: Home / Self Care | Payer: Commercial Managed Care - PPO | Source: Ambulatory Visit | Attending: Internal Medicine | Admitting: Internal Medicine

## 2018-12-26 DIAGNOSIS — E0590 Thyrotoxicosis, unspecified without thyrotoxic crisis or storm: Secondary | ICD-10-CM | POA: Diagnosis not present

## 2018-12-26 MED ORDER — SODIUM IODIDE I-123 7.4 MBQ CAPS
459.0000 | ORAL_CAPSULE | Freq: Once | ORAL | Status: AC
  Administered 2018-12-26: 459 via ORAL

## 2018-12-27 ENCOUNTER — Ambulatory Visit (HOSPITAL_COMMUNITY)
Admission: RE | Admit: 2018-12-27 | Discharge: 2018-12-27 | Disposition: A | Discharge status: Home / Self Care | Payer: Commercial Managed Care - PPO | Source: Ambulatory Visit | Attending: Internal Medicine | Admitting: Internal Medicine

## 2018-12-27 DIAGNOSIS — E0590 Thyrotoxicosis, unspecified without thyrotoxic crisis or storm: Secondary | ICD-10-CM | POA: Diagnosis not present

## 2019-01-08 ENCOUNTER — Inpatient Hospital Stay: Admit: 2019-01-08 | Payer: Managed Care, Other (non HMO) | Admitting: Orthopedic Surgery

## 2019-01-08 SURGERY — ARTHROPLASTY, KNEE, TOTAL
Anesthesia: Choice | Site: Knee | Laterality: Right

## 2019-01-18 DIAGNOSIS — E0590 Thyrotoxicosis, unspecified without thyrotoxic crisis or storm: Secondary | ICD-10-CM | POA: Diagnosis not present

## 2019-01-20 DIAGNOSIS — E109 Type 1 diabetes mellitus without complications: Secondary | ICD-10-CM | POA: Diagnosis not present

## 2019-01-31 ENCOUNTER — Other Ambulatory Visit (HOSPITAL_COMMUNITY): Payer: Self-pay | Admitting: Internal Medicine

## 2019-01-31 DIAGNOSIS — E0590 Thyrotoxicosis, unspecified without thyrotoxic crisis or storm: Secondary | ICD-10-CM

## 2019-02-13 DIAGNOSIS — E0500 Thyrotoxicosis with diffuse goiter without thyrotoxic crisis or storm: Secondary | ICD-10-CM | POA: Diagnosis not present

## 2019-02-15 ENCOUNTER — Ambulatory Visit (HOSPITAL_COMMUNITY): Payer: Commercial Managed Care - PPO

## 2019-02-18 DIAGNOSIS — E109 Type 1 diabetes mellitus without complications: Secondary | ICD-10-CM | POA: Diagnosis not present

## 2019-03-06 DIAGNOSIS — E118 Type 2 diabetes mellitus with unspecified complications: Secondary | ICD-10-CM | POA: Diagnosis not present

## 2019-03-06 DIAGNOSIS — E0590 Thyrotoxicosis, unspecified without thyrotoxic crisis or storm: Secondary | ICD-10-CM | POA: Diagnosis not present

## 2019-03-21 DIAGNOSIS — E109 Type 1 diabetes mellitus without complications: Secondary | ICD-10-CM | POA: Diagnosis not present

## 2019-03-23 ENCOUNTER — Ambulatory Visit (HOSPITAL_COMMUNITY)
Admission: RE | Admit: 2019-03-23 | Discharge: 2019-03-23 | Disposition: A | Discharge status: Home / Self Care | Payer: Commercial Managed Care - PPO | Source: Ambulatory Visit | Attending: Internal Medicine | Admitting: Internal Medicine

## 2019-03-23 ENCOUNTER — Other Ambulatory Visit: Payer: Self-pay

## 2019-03-23 DIAGNOSIS — E0500 Thyrotoxicosis with diffuse goiter without thyrotoxic crisis or storm: Secondary | ICD-10-CM | POA: Diagnosis not present

## 2019-03-23 DIAGNOSIS — E0590 Thyrotoxicosis, unspecified without thyrotoxic crisis or storm: Secondary | ICD-10-CM | POA: Insufficient documentation

## 2019-03-23 MED ORDER — SODIUM IODIDE I 131 CAPSULE
18.1000 | Freq: Once | INTRAVENOUS | Status: AC | PRN
  Administered 2019-03-23: 18.1 via ORAL

## 2019-03-26 DIAGNOSIS — E118 Type 2 diabetes mellitus with unspecified complications: Secondary | ICD-10-CM | POA: Diagnosis not present

## 2019-03-26 DIAGNOSIS — E0590 Thyrotoxicosis, unspecified without thyrotoxic crisis or storm: Secondary | ICD-10-CM | POA: Diagnosis not present

## 2019-03-26 DIAGNOSIS — I10 Essential (primary) hypertension: Secondary | ICD-10-CM | POA: Diagnosis not present

## 2019-07-18 NOTE — H&P (Signed)
TOTAL KNEE ADMISSION H&P  Patient is being admitted for right total knee arthroplasty.  Subjective:  Chief Complaint:right knee pain.  HPI: Tim [name], 63 y.o. male, has a history of pain and functional disability in the right knee due to arthritis and has failed non-surgical conservative treatments for greater than 12 weeks to includeactivity modification.  Onset of symptoms was gradual, starting 10 years ago with gradually worsening course since that time. The patient noted no past surgery on the right knee(s).  Patient currently rates pain in the right knee(s) at 8 out of 10 with activity. Patient has worsening of pain with activity and weight bearing, crepitus and limited range of motion.  Patient has evidence of bone-on-bone arthritis in the medial and lateral compartments with massive osteophyte formation, particularly about the patella by imaging studies. There is no active infection.  Patient Active Problem List   Diagnosis Date Noted   Postoperative stiffness of total knee replacement ([place]) 10/12/2015   OA (osteoarthritis) of knee 07/28/2015   Past Medical History:  Diagnosis Date   Arthritis    Hypertension     Past Surgical History:  Procedure Laterality Date   KNEE CLOSED REDUCTION Left 10/13/2015   Procedure: LEFT CLOSED MANIPULATION KNEE;  Surgeon: Gaynelle Arabian, MD;  Location: [facility];  Service: Orthopedics;  Laterality: Left;   TOTAL KNEE ARTHROPLASTY Left 07/28/2015   Procedure: LEFT TOTAL KNEE ARTHROPLASTY;  Surgeon: Gaynelle Arabian, MD;  Location: [facility];  Service: Orthopedics;  Laterality: Left;    No current facility-administered medications for this encounter.    Current Outpatient Medications  Medication Sig Dispense Refill Last Dose   amLODipine (NORVASC) 5 MG tablet Take 5 mg by mouth every morning.   6 10/12/2015 at Unknown time   aspirin EC 81 MG tablet Take 81 mg by mouth daily.   10/10/2015   atorvastatin (LIPITOR) 20 MG tablet Take 20 mg by mouth  daily.  6 10/12/2015 at Unknown time   metFORMIN (GLUCOPHAGE-XR) 500 MG 24 hr tablet Take 500 mg by mouth 2 (two) times daily.  3 10/12/2015 at Unknown time   Multiple Vitamin (MULTIVITAMIN WITH MINERALS) TABS tablet Take 1 tablet by mouth daily.   Past Week at Unknown time   oxyCODONE (ROXICODONE) 5 MG immediate release tablet Take 1 tablet (5 mg total) by mouth every 4 (four) hours as needed for severe pain. 30 tablet 0    traMADol (ULTRAM) 50 MG tablet Take 1-2 tablets (50-100 mg total) by mouth every 6 (six) hours as needed (mild pain). 60 tablet 1 10/12/2015   valsartan (DIOVAN) 160 MG tablet Take 160 mg by mouth every morning.    10/12/2015 at Unknown time   Allergies  Allergen Reactions   Ace Inhibitors Swelling    Swelling of lips and mouth    Social History   Tobacco Use   Smoking status: Current Some Day Smoker    Types: Cigars   Smokeless tobacco: Never Used   Tobacco comment: Occassional cigar  Substance Use Topics   Alcohol use: Yes    Comment: 2 BEERS WEEKLY PER PATIENT     No family history on file.   Review of Systems  Constitutional: Negative for chills and fever.  HENT: Negative for congestion, sore throat and tinnitus.   Eyes: Negative for double vision, photophobia and pain.  Respiratory: Negative for cough, shortness of breath and wheezing.   Cardiovascular: Negative for chest pain, palpitations and orthopnea.  Gastrointestinal: Negative for heartburn, nausea and vomiting.  Genitourinary: Negative for dysuria, frequency and urgency.  Musculoskeletal: Positive for joint pain.  Neurological: Negative for dizziness, weakness and headaches.    Objective:  Physical Exam Well nourished and well developed.  General: Alert and oriented x3, cooperative and pleasant, no acute distress.  Head: normocephalic, atraumatic, neck supple.  Eyes: EOMI.  Respiratory: breath sounds clear in all fields, no wheezing, rales, or rhonchi. Cardiovascular: Regular  rate and rhythm, no murmurs, gallops or rubs.  Abdomen: non-tender to palpation and soft, normoactive bowel sounds. Musculoskeletal:  Right Knee Exam:  No effusion. No Swelling. Range of motion is 5-115 degrees.  Marked crepitus on range of motion of the knee.  Patient has medial joint line tenderness No lateral joint line tenderness.  Slight varus.  Stable knee.  Calves soft and nontender. Motor function intact in LE. Strength 5/5 LE bilaterally. Neuro: Distal pulses 2+. Sensation to light touch intact in LE.  Vital signs in last 24 hours: Blood pressure: 132/86 mmHg  Labs:   Estimated body mass index is 31.75 kg/m as calculated from the following:   Height as of 10/13/15: 6\' 3"  (1.905 m).   Weight as of 10/13/15: 115.2 kg.   Imaging Review Plain radiographs demonstrate severe degenerative joint disease of the right knee(s). The overall alignment ismild varus. The bone quality appears to be adequate for age and reported activity level.  Assessment/Plan:  End stage arthritis, right knee   The patient history, physical examination, clinical judgment of the provider and imaging studies are consistent with end stage degenerative joint disease of the right knee(s) and total knee arthroplasty is deemed medically necessary. The treatment options including medical management, injection therapy arthroscopy and arthroplasty were discussed at length. The risks and benefits of total knee arthroplasty were presented and reviewed. The risks due to aseptic loosening, infection, stiffness, patella tracking problems, thromboembolic complications and other imponderables were discussed. The patient acknowledged the explanation, agreed to proceed with the plan and consent was signed. Patient is being admitted for inpatient treatment for surgery, pain control, PT, OT, prophylactic antibiotics, VTE prophylaxis, progressive ambulation and ADL's and discharge planning. The patient is planning to be  discharged home.   Anticipated LOS equal to or greater than 2 midnights due to - Age 63 and older with one or more of the following:  - Obesity  - Expected need for hospital services (PT, OT, Nursing) required for safe  discharge  - Anticipated need for postoperative skilled nursing care or inpatient rehab  - Active co-morbidities: Diabetes OR   - Unanticipated findings during/Post Surgery: None  - Patient is a high risk of re-admission due to: None  The[facility] Disposition: Home with wife Planned DVT Prophylaxis: Aspirin 325 mg BID DME needed: Dan HumphreysWalker PCP: Georgianne FickAjith Ramachandran, MD TXA: IV Allergies: ACEI (cough) Anesthesia Concerns: None BMI: 34.4 Last HgbA1c: 6.5%  - Patient was instructed on what medications to stop prior to surgery. - Follow-up visit in 2 weeks with Dr. Lequita HaltAluisio - Begin physical therapy following surgery - Pre-operative lab work as pre-surgical testing - Prescriptions will be provided in hospital at time of discharge  Arther AbbottKristie Clotilde Loth, PA-C Orthopedic Surgery [facility] [place]

## 2019-08-01 NOTE — Patient Instructions (Addendum)
YOU HAVE HAD A COVID 19 TEST ON 08-02-19  @ 3:00 PM. PLEASE BEGIN THE QUARANTINE INSTRUCTIONS AS OUTLINED IN YOUR HANDOUT.                Tim [name]    Your procedure is scheduled on: 08-06-2019   Report to [facility] Main  Entrance    Report to Admitting at 9:05 AM   1 VISITOR IS ALLOWED TO WAIT IN WAITING ROOM  ONLY DAY OF YOUR SURGERY.    Call this number if you have problems the morning of surgery [phone]    Remember: NO SOLID FOOD AFTER MIDNIGHT. NOTHING BY MOUTH EXCEPT CLEAR LIQUIDS.  PLEASE FINISH G2  DRINK PER SURGEON ORDER AT 8:35 AM.   CLEAR LIQUID DIET   Foods Allowed                                                                     Foods Excluded  Coffee and tea, regular and decaf                             liquids that you cannot  Plain Jell-O any favor except red or purple                                           see through such as: Fruit ices (not with fruit pulp)                                     milk, soups, orange juice  Iced Popsicles                                    All solid food Carbonated beverages, regular and diet                                    Cranberry, grape and apple juices Sports drinks like Gatorade Lightly seasoned clear broth or consume(fat free) Sugar, honey syrup   _____________________________________________________________________    Take these medicines the morning of surgery with A SIP OF WATER: Atorvastatin (Lipitor), Levothyroxine (Synthroid). You may also use and bring your eyedrops  BRUSH YOUR TEETH MORNING OF SURGERY AND RINSE YOUR MOUTH OUT, NO CHEWING GUM CANDY OR MINTS.                   You may not have any metal on your body including hair pins and              piercings     Do not wear jewelry, cologne, lotions, powders or deodorant                        Men may shave face and neck.   Do not bring valuables to the hospital. [facility] IS NOT  RESPONSIBLE   FOR  VALUABLES.  Contacts, dentures or bridgework may not be worn into surgery.  Leave suitcase in the car. After surgery it may be brought to your room.     _____________________________________________________________________  DO NOT TAKE ANY DIABETIC MEDICATIONS DAY OF YOUR SURGERY         How to Manage Your Diabetes Before and After Surgery  How do I manage my blood sugar before surgery? . Check your blood sugar at least 4 times a day, starting 2 days before surgery, to make sure that the level is not too high or low. o Check your blood sugar the morning of your surgery when you wake up and every 2 hours until you get to the Short Stay unit. . If your blood sugar is less than 70 mg/dL, you will need to treat for low blood sugar: o Do not take insulin. o Treat a low blood sugar (less than 70 mg/dL) with  cup of clear juice (cranberry or apple), 4 glucose tablets, OR glucose gel. o Recheck blood sugar in 15 minutes after treatment (to make sure it is greater than 70 mg/dL). If your blood sugar is not greater than 70 mg/dL on recheck, call [phone] for further instructions. . Report your blood sugar to the short stay nurse when you get to Short Stay.  . If you are admitted to the hospital after surgery: o Your blood sugar will be checked by the staff and you will probably be given insulin after surgery (instead of oral diabetes medicines) to make sure you have good blood sugar levels. o The goal for blood sugar control after surgery is 80-180 mg/dL.   WHAT DO I DO ABOUT MY DIABETES MEDICATION?  .  THE DAY BEFORE SURGERY TAKE METFORMIN AND JANUVIA AS USUAL.  . THE  MORNING OF  SURGERY DO NOT TAKE  METFORMIN OR JANIUVIA.                 [facility] - Preparing for Surgery Before surgery, you can play an important role.  Because skin is not sterile, your skin needs to be as free of germs as possible.  You can reduce the number of germs on your skin by washing with CHG  (chlorahexidine gluconate) soap before surgery.  CHG is an antiseptic cleaner which kills germs and bonds with the skin to continue killing germs even after washing. Please DO NOT use if you have an allergy to CHG or antibacterial soaps.  If your skin becomes reddened/irritated stop using the CHG and inform your nurse when you arrive at Short Stay. Do not shave (including legs and underarms) for at least 48 hours prior to the first CHG shower.  You may shave your face/neck. Please follow these instructions carefully:  1.  Shower with CHG Soap the night before surgery and the  morning of Surgery.  2.  If you choose to wash your hair, wash your hair first as usual with your  normal  shampoo.  3.  After you shampoo, rinse your hair and body thoroughly to remove the  shampoo.                           4.  Use CHG as you would any other liquid soap.  You can apply chg directly  to the skin and wash  Gently with a scrungie or clean washcloth.  5.  Apply the CHG Soap to your body ONLY FROM THE NECK DOWN.   Do not use on face/ open                           Wound or open sores. Avoid contact with eyes, ears mouth and genitals (private parts).                       Wash face,  Genitals (private parts) with your normal soap.             6.  Wash thoroughly, paying special attention to the area where your surgery  will be performed.  7.  Thoroughly rinse your body with warm water from the neck down.  8.  DO NOT shower/wash with your normal soap after using and rinsing off  the CHG Soap.                9.  Pat yourself dry with a clean towel.            10.  Wear clean pajamas.            11.  Place clean sheets on your bed the night of your first shower and do not  sleep with pets. Day of Surgery : Do not apply any lotions/deodorants the morning of surgery.  Please wear clean clothes to the hospital/surgery center.  FAILURE TO FOLLOW THESE INSTRUCTIONS MAY RESULT IN THE CANCELLATION OF  YOUR SURGERY PATIENT SIGNATURE_________________________________  NURSE SIGNATURE__________________________________  ________________________________________________________________________   Tim [name]  An incentive spirometer is a tool that can help keep your lungs clear and active. This tool measures how well you are filling your lungs with each breath. Taking long deep breaths may help reverse or decrease the chance of developing breathing (pulmonary) problems (especially infection) following:  A long period of time when you are unable to move or be active. BEFORE THE PROCEDURE   If the spirometer includes an indicator to show your best effort, your nurse or respiratory therapist will set it to a desired goal.  If possible, sit up straight or lean slightly forward. Try not to slouch.  Hold the incentive spirometer in an upright position. INSTRUCTIONS FOR USE  1. Sit on the edge of your bed if possible, or sit up as far as you can in bed or on a chair. 2. Hold the incentive spirometer in an upright position. 3. Breathe out normally. 4. Place the mouthpiece in your mouth and seal your lips tightly around it. 5. Breathe in slowly and as deeply as possible, raising the piston or the ball toward the top of the column. 6. Hold your breath for 3-5 seconds or for as long as possible. Allow the piston or ball to fall to the bottom of the column. 7. Remove the mouthpiece from your mouth and breathe out normally. 8. Rest for a few seconds and repeat Steps 1 through 7 at least 10 times every 1-2 hours when you are awake. Take your time and take a few normal breaths between deep breaths. 9. The spirometer may include an indicator to show your best effort. Use the indicator as a goal to work toward during each repetition. 10. After each set of 10 deep breaths, practice coughing to be sure your lungs are clear. If you have an incision (the cut made at the time of surgery),  support your  incision when coughing by placing a pillow or rolled up towels firmly against it. Once you are able to get out of bed, walk around indoors and cough well. You may stop using the incentive spirometer when instructed by your caregiver.  RISKS AND COMPLICATIONS  Take your time so you do not get dizzy or light-headed.  If you are in pain, you may need to take or ask for pain medication before doing incentive spirometry. It is harder to take a deep breath if you are having pain. AFTER USE  Rest and breathe slowly and easily.  It can be helpful to keep track of a log of your progress. Your caregiver can provide you with a simple table to help with this. If you are using the spirometer at home, follow these instructions: SEEK MEDICAL CARE IF:   You are having difficultly using the spirometer.  You have trouble using the spirometer as often as instructed.  Your pain medication is not giving enough relief while using the spirometer.  You develop fever of 100.5 F (38.1 C) or higher. SEEK IMMEDIATE MEDICAL CARE IF:   You cough up bloody sputum that had not been present before.  You develop fever of 102 F (38.9 C) or greater.  You develop worsening pain at or near the incision site. MAKE SURE YOU:   Understand these instructions.  Will watch your condition.  Will get help right away if you are not doing well or get worse. Document Released: 04/18/2007 Document Revised: 02/28/2012 Document Reviewed: 06/19/2007 Exit[employer] Patient Information 2014 [employer], [place].   ________________________________________________________________________  WHAT IS A BLOOD TRANSFUSION? Blood Transfusion Information  A transfusion is the replacement of blood or some of its parts. Blood is made up of multiple cells which provide different functions.  Red blood cells carry oxygen and are used for blood loss replacement.  White blood cells fight against infection.  Platelets control bleeding.  Plasma  helps clot blood.  Other blood products are available for specialized needs, such as hemophilia or other clotting disorders. BEFORE THE TRANSFUSION  Who gives blood for transfusions?   Healthy volunteers who are fully evaluated to make sure their blood is safe. This is blood bank blood. Transfusion therapy is the safest it has ever been in the practice of medicine. Before blood is taken from a donor, a complete history is taken to make sure that person has no history of diseases nor engages in risky social behavior (examples are intravenous drug use or sexual activity with multiple partners). The donor's travel history is screened to minimize risk of transmitting infections, such as malaria. The donated blood is tested for signs of infectious diseases, such as HIV and hepatitis. The blood is then tested to be sure it is compatible with you in order to minimize the chance of a transfusion reaction. If you or a relative donates blood, this is often done in anticipation of surgery and is not appropriate for emergency situations. It takes many days to process the donated blood. RISKS AND COMPLICATIONS Although transfusion therapy is very safe and saves many lives, the main dangers of transfusion include:   Getting an infectious disease.  Developing a transfusion reaction. This is an allergic reaction to something in the blood you were given. Every precaution is taken to prevent this. The decision to have a blood transfusion has been considered carefully by your caregiver before blood is given. Blood is not given unless the benefits outweigh the risks. AFTER THE TRANSFUSION  Right after receiving a blood transfusion, you will usually feel much better and more energetic. This is especially true if your red blood cells have gotten low (anemic). The transfusion raises the level of the red blood cells which carry oxygen, and this usually causes an energy increase.  The nurse administering the transfusion  will monitor you carefully for complications. HOME CARE INSTRUCTIONS  No special instructions are needed after a transfusion. You may find your energy is better. Speak with your caregiver about any limitations on activity for underlying diseases you may have. SEEK MEDICAL CARE IF:   Your condition is not improving after your transfusion.  You develop redness or irritation at the intravenous (IV) site. SEEK IMMEDIATE MEDICAL CARE IF:  Any of the following symptoms occur over the next 12 hours:  Shaking chills.  You have a temperature by mouth above 102 F (38.9 C), not controlled by medicine.  Chest, back, or muscle pain.  People around you feel you are not acting correctly or are confused.  Shortness of breath or difficulty breathing.  Dizziness and fainting.  You get a rash or develop hives.  You have a decrease in urine output.  Your urine turns a dark color or changes to pink, red, or brown. Any of the following symptoms occur over the next 10 days:  You have a temperature by mouth above 102 F (38.9 C), not controlled by medicine.  Shortness of breath.  Weakness after normal activity.  The white part of the eye turns yellow (jaundice).  You have a decrease in the amount of urine or are urinating less often.  Your urine turns a dark color or changes to pink, red, or brown. Document Released: 12/03/2000 Document Revised: 02/28/2012 Document Reviewed: 07/22/2008 [facility] Patient Information 2014 [place], [place].  _______________________________________________________________________

## 2019-08-02 ENCOUNTER — Other Ambulatory Visit: Payer: Self-pay

## 2019-08-02 ENCOUNTER — Other Ambulatory Visit (HOSPITAL_COMMUNITY)
Admission: RE | Admit: 2019-08-02 | Discharge: 2019-08-02 | Disposition: A | Discharge status: Home / Self Care | Payer: Commercial Managed Care - PPO | Source: Ambulatory Visit

## 2019-08-02 ENCOUNTER — Encounter (HOSPITAL_COMMUNITY): Payer: Self-pay

## 2019-08-02 ENCOUNTER — Encounter (HOSPITAL_COMMUNITY)
Admission: RE | Admit: 2019-08-02 | Discharge: 2019-08-02 | Disposition: A | Discharge status: Home / Self Care | Payer: Commercial Managed Care - PPO | Source: Ambulatory Visit | Attending: Orthopedic Surgery | Admitting: Orthopedic Surgery

## 2019-08-02 DIAGNOSIS — Z7722 Contact with and (suspected) exposure to environmental tobacco smoke (acute) (chronic): Secondary | ICD-10-CM | POA: Diagnosis not present

## 2019-08-02 DIAGNOSIS — I10 Essential (primary) hypertension: Secondary | ICD-10-CM | POA: Insufficient documentation

## 2019-08-02 DIAGNOSIS — Z01818 Encounter for other preprocedural examination: Secondary | ICD-10-CM | POA: Diagnosis not present

## 2019-08-02 DIAGNOSIS — M1711 Unilateral primary osteoarthritis, right knee: Secondary | ICD-10-CM | POA: Diagnosis not present

## 2019-08-02 DIAGNOSIS — Z20828 Contact with and (suspected) exposure to other viral communicable diseases: Secondary | ICD-10-CM | POA: Diagnosis not present

## 2019-08-02 LAB — COMPREHENSIVE METABOLIC PANEL
ALT: 30 U/L (ref 0–44)
AST: 36 U/L (ref 15–41)
Albumin: 4.3 g/dL (ref 3.5–5.0)
Alkaline Phosphatase: 64 U/L (ref 38–126)
Anion gap: 9 (ref 5–15)
BUN: 18 mg/dL (ref 8–23)
CO2: 27 mmol/L (ref 22–32)
Calcium: 9.3 mg/dL (ref 8.9–10.3)
Chloride: 101 mmol/L (ref 98–111)
Creatinine, Ser: 1.59 mg/dL — ABNORMAL HIGH (ref 0.61–1.24)
GFR calc Af Amer: 53 mL/min — ABNORMAL LOW (ref >60)
GFR calc non Af Amer: 46 mL/min — ABNORMAL LOW (ref >60)
Glucose, Bld: 97 mg/dL (ref 70–99)
Potassium: 4.3 mmol/L (ref 3.5–5.1)
Sodium: 137 mmol/L (ref 135–145)
Total Bilirubin: 0.7 mg/dL (ref 0.3–1.2)
Total Protein: 7.8 g/dL (ref 6.5–8.1)

## 2019-08-02 LAB — SURGICAL PCR SCREEN
MRSA, PCR: NEGATIVE
Staphylococcus aureus: NEGATIVE

## 2019-08-02 LAB — CBC
HCT: 39.2 % (ref 39.0–52.0)
Hemoglobin: 12.4 g/dL — ABNORMAL LOW (ref 13.0–17.0)
MCH: 25.7 pg — ABNORMAL LOW (ref 26.0–34.0)
MCHC: 31.6 g/dL (ref 30.0–36.0)
MCV: 81.3 fL (ref 80.0–100.0)
Platelets: 195 10*3/uL (ref 150–400)
RBC: 4.82 MIL/uL (ref 4.22–5.81)
RDW: 17 % — ABNORMAL HIGH (ref 11.5–15.5)
WBC: 5.7 10*3/uL (ref 4.0–10.5)
nRBC: 0 % (ref 0.0–0.2)

## 2019-08-02 LAB — PROTIME-INR
INR: 1 (ref 0.8–1.2)
Prothrombin Time: 13.1 seconds (ref 11.4–15.2)

## 2019-08-02 LAB — GLUCOSE, CAPILLARY: Glucose-Capillary: 107 mg/dL — ABNORMAL HIGH (ref 70–99)

## 2019-08-02 LAB — APTT: aPTT: 33 seconds (ref 24–36)

## 2019-08-02 LAB — SARS CORONAVIRUS 2 (TAT 6-24 HRS): SARS Coronavirus 2: NEGATIVE

## 2019-08-02 LAB — HEMOGLOBIN A1C
Hgb A1c MFr Bld: 6.3 % — ABNORMAL HIGH (ref 4.8–5.6)
Mean Plasma Glucose: 134.11 mg/dL

## 2019-08-05 MED ORDER — DEXTROSE 5 % IV SOLN
3.0000 g | INTRAVENOUS | Status: AC
  Administered 2019-08-06: 12:00:00 3 g via INTRAVENOUS
  Filled 2019-08-05: qty 3

## 2019-08-05 MED ORDER — BUPIVACAINE LIPOSOME 1.3 % IJ SUSP
20.0000 mL | Freq: Once | INTRAMUSCULAR | Status: AC
  Filled 2019-08-05: qty 20

## 2019-08-06 ENCOUNTER — Inpatient Hospital Stay (HOSPITAL_COMMUNITY): Payer: Commercial Managed Care - PPO | Admitting: Physician Assistant

## 2019-08-06 ENCOUNTER — Inpatient Hospital Stay (HOSPITAL_COMMUNITY)
Admission: RE | Admit: 2019-08-06 | Discharge: 2019-08-07 | DRG: 470 | Disposition: A | Discharge status: Home / Self Care | Payer: Commercial Managed Care - PPO | Attending: Orthopedic Surgery | Admitting: Orthopedic Surgery

## 2019-08-06 ENCOUNTER — Encounter (HOSPITAL_COMMUNITY): Payer: Self-pay | Admitting: *Deleted

## 2019-08-06 ENCOUNTER — Encounter (HOSPITAL_COMMUNITY)
Admission: RE | Disposition: A | Discharge status: Home / Self Care | Payer: Self-pay | Source: Home / Self Care | Attending: Orthopedic Surgery

## 2019-08-06 ENCOUNTER — Other Ambulatory Visit: Payer: Self-pay

## 2019-08-06 DIAGNOSIS — M179 Osteoarthritis of knee, unspecified: Secondary | ICD-10-CM

## 2019-08-06 DIAGNOSIS — E669 Obesity, unspecified: Secondary | ICD-10-CM | POA: Diagnosis present

## 2019-08-06 DIAGNOSIS — I10 Essential (primary) hypertension: Secondary | ICD-10-CM | POA: Diagnosis present

## 2019-08-06 DIAGNOSIS — Z96652 Presence of left artificial knee joint: Secondary | ICD-10-CM | POA: Diagnosis present

## 2019-08-06 DIAGNOSIS — E119 Type 2 diabetes mellitus without complications: Secondary | ICD-10-CM | POA: Diagnosis present

## 2019-08-06 DIAGNOSIS — Z20828 Contact with and (suspected) exposure to other viral communicable diseases: Secondary | ICD-10-CM | POA: Diagnosis present

## 2019-08-06 DIAGNOSIS — Z7984 Long term (current) use of oral hypoglycemic drugs: Secondary | ICD-10-CM | POA: Diagnosis not present

## 2019-08-06 DIAGNOSIS — Z6834 Body mass index (BMI) 34.0-34.9, adult: Secondary | ICD-10-CM | POA: Diagnosis not present

## 2019-08-06 DIAGNOSIS — Z7982 Long term (current) use of aspirin: Secondary | ICD-10-CM | POA: Diagnosis not present

## 2019-08-06 DIAGNOSIS — F17290 Nicotine dependence, other tobacco product, uncomplicated: Secondary | ICD-10-CM | POA: Diagnosis present

## 2019-08-06 DIAGNOSIS — M1711 Unilateral primary osteoarthritis, right knee: Secondary | ICD-10-CM | POA: Diagnosis present

## 2019-08-06 DIAGNOSIS — M1710 Unilateral primary osteoarthritis, unspecified knee: Secondary | ICD-10-CM

## 2019-08-06 HISTORY — PX: TOTAL KNEE ARTHROPLASTY: SHX125

## 2019-08-06 LAB — GLUCOSE, CAPILLARY
Glucose-Capillary: 113 mg/dL — ABNORMAL HIGH (ref 70–99)
Glucose-Capillary: 101 mg/dL — ABNORMAL HIGH (ref 70–99)
Glucose-Capillary: 201 mg/dL — ABNORMAL HIGH (ref 70–99)

## 2019-08-06 LAB — TYPE AND SCREEN
ABO/RH(D): A POS
Antibody Screen: NEGATIVE

## 2019-08-06 SURGERY — ARTHROPLASTY, KNEE, TOTAL
Anesthesia: Spinal | Site: Knee | Laterality: Right

## 2019-08-06 MED ORDER — METOCLOPRAMIDE HCL 5 MG PO TABS: 5.0000 mg | ORAL_TABLET | Freq: Three times a day (TID) | ORAL | Status: DC | PRN

## 2019-08-06 MED ORDER — ROPIVACAINE HCL 7.5 MG/ML IJ SOLN
INTRAMUSCULAR | Status: DC | PRN
  Administered 2019-08-06 (×6): 5 mL via PERINEURAL

## 2019-08-06 MED ORDER — PROPOFOL 500 MG/50ML IV EMUL
INTRAVENOUS | Status: DC | PRN
  Administered 2019-08-06: 40 ug/kg/min via INTRAVENOUS

## 2019-08-06 MED ORDER — KETOROLAC TROMETHAMINE 30 MG/ML IJ SOLN: 30.0000 mg | Freq: Once | INTRAMUSCULAR | Status: DC | PRN

## 2019-08-06 MED ORDER — PROMETHAZINE HCL 25 MG/ML IJ SOLN: 6.2500 mg | INTRAMUSCULAR | Status: DC | PRN

## 2019-08-06 MED ORDER — STERILE WATER FOR IRRIGATION IR SOLN
Status: DC | PRN
  Administered 2019-08-06: 2000 mL

## 2019-08-06 MED ORDER — SODIUM CHLORIDE 0.9 % IV SOLN
INTRAVENOUS | Status: DC
  Administered 2019-08-06 – 2019-08-07 (×2): via INTRAVENOUS

## 2019-08-06 MED ORDER — ACETAMINOPHEN 10 MG/ML IV SOLN
1000.0000 mg | Freq: Four times a day (QID) | INTRAVENOUS | Status: DC
  Administered 2019-08-06: 1000 mg via INTRAVENOUS
  Filled 2019-08-06: qty 100

## 2019-08-06 MED ORDER — MENTHOL 3 MG MT LOZG: 1.0000 | LOZENGE | OROMUCOSAL | Status: DC | PRN

## 2019-08-06 MED ORDER — TRANEXAMIC ACID-NACL 1000-0.7 MG/100ML-% IV SOLN
1000.0000 mg | INTRAVENOUS | Status: AC
  Administered 2019-08-06: 12:00:00 1000 mg via INTRAVENOUS
  Filled 2019-08-06: qty 100

## 2019-08-06 MED ORDER — SODIUM CHLORIDE 0.9 % IR SOLN
Status: DC | PRN
  Administered 2019-08-06: 1000 mL

## 2019-08-06 MED ORDER — 0.9 % SODIUM CHLORIDE (POUR BTL) OPTIME
TOPICAL | Status: DC | PRN
  Administered 2019-08-06: 1000 mL

## 2019-08-06 MED ORDER — SODIUM CHLORIDE (PF) 0.9 % IJ SOLN
INTRAMUSCULAR | Status: DC | PRN
  Administered 2019-08-06: 60 mL

## 2019-08-06 MED ORDER — CLONIDINE HCL (ANALGESIA) 100 MCG/ML EP SOLN
EPIDURAL | Status: DC | PRN
  Administered 2019-08-06: 100 ug

## 2019-08-06 MED ORDER — METHOCARBAMOL 500 MG PO TABS
500.0000 mg | ORAL_TABLET | Freq: Four times a day (QID) | ORAL | Status: DC | PRN
  Administered 2019-08-06 – 2019-08-07 (×2): 500 mg via ORAL
  Filled 2019-08-06 (×2): qty 1

## 2019-08-06 MED ORDER — FENTANYL CITRATE (PF) 100 MCG/2ML IJ SOLN
50.0000 ug | Freq: Once | INTRAMUSCULAR | Status: AC
  Administered 2019-08-06: 100 ug via INTRAVENOUS
  Filled 2019-08-06: qty 2

## 2019-08-06 MED ORDER — DEXAMETHASONE SODIUM PHOSPHATE 10 MG/ML IJ SOLN: 10.0000 mg | Freq: Once | INTRAMUSCULAR | Status: DC

## 2019-08-06 MED ORDER — ONDANSETRON HCL 4 MG/2ML IJ SOLN: 4.0000 mg | Freq: Four times a day (QID) | INTRAMUSCULAR | Status: DC | PRN

## 2019-08-06 MED ORDER — ATORVASTATIN CALCIUM 20 MG PO TABS
20.0000 mg | ORAL_TABLET | Freq: Every day | ORAL | Status: DC
  Administered 2019-08-07: 20 mg via ORAL
  Filled 2019-08-06: qty 1

## 2019-08-06 MED ORDER — EPHEDRINE 5 MG/ML INJ
INTRAVENOUS | Status: AC
  Filled 2019-08-06: qty 10

## 2019-08-06 MED ORDER — POVIDONE-IODINE 10 % EX SWAB: 2.0000 "application " | Freq: Once | CUTANEOUS | Status: DC

## 2019-08-06 MED ORDER — PROPOFOL 10 MG/ML IV BOLUS
INTRAVENOUS | Status: AC
  Filled 2019-08-06: qty 20

## 2019-08-06 MED ORDER — DIPHENHYDRAMINE HCL 12.5 MG/5ML PO ELIX: 12.5000 mg | ORAL_SOLUTION | ORAL | Status: DC | PRN

## 2019-08-06 MED ORDER — SODIUM CHLORIDE (PF) 0.9 % IJ SOLN
INTRAMUSCULAR | Status: AC
  Filled 2019-08-06: qty 10

## 2019-08-06 MED ORDER — CHLORHEXIDINE GLUCONATE 4 % EX LIQD: 60.0000 mL | Freq: Once | CUTANEOUS | Status: DC

## 2019-08-06 MED ORDER — LINAGLIPTIN 5 MG PO TABS
5.0000 mg | ORAL_TABLET | Freq: Every day | ORAL | Status: DC
  Administered 2019-08-07: 5 mg via ORAL
  Filled 2019-08-06: qty 1

## 2019-08-06 MED ORDER — TRANEXAMIC ACID-NACL 1000-0.7 MG/100ML-% IV SOLN
1000.0000 mg | Freq: Once | INTRAVENOUS | Status: AC
  Administered 2019-08-06: 1000 mg via INTRAVENOUS
  Filled 2019-08-06: qty 100

## 2019-08-06 MED ORDER — PROPOFOL 10 MG/ML IV BOLUS
INTRAVENOUS | Status: DC | PRN
  Administered 2019-08-06: 20 mg via INTRAVENOUS

## 2019-08-06 MED ORDER — EPHEDRINE SULFATE-NACL 50-0.9 MG/10ML-% IV SOSY
PREFILLED_SYRINGE | INTRAVENOUS | Status: DC | PRN
  Administered 2019-08-06: 10 mg via INTRAVENOUS
  Administered 2019-08-06: 5 mg via INTRAVENOUS

## 2019-08-06 MED ORDER — PROPOFOL 10 MG/ML IV BOLUS
INTRAVENOUS | Status: AC
  Filled 2019-08-06: qty 40

## 2019-08-06 MED ORDER — OXYCODONE HCL 5 MG PO TABS
5.0000 mg | ORAL_TABLET | ORAL | Status: DC | PRN
  Administered 2019-08-06 – 2019-08-07 (×3): 10 mg via ORAL
  Filled 2019-08-06 (×3): qty 2

## 2019-08-06 MED ORDER — DOCUSATE SODIUM 100 MG PO CAPS
100.0000 mg | ORAL_CAPSULE | Freq: Two times a day (BID) | ORAL | Status: DC
  Administered 2019-08-06 – 2019-08-07 (×2): 100 mg via ORAL
  Filled 2019-08-06 (×2): qty 1

## 2019-08-06 MED ORDER — ONDANSETRON HCL 4 MG/2ML IJ SOLN
INTRAMUSCULAR | Status: DC | PRN
  Administered 2019-08-06: 4 mg via INTRAVENOUS

## 2019-08-06 MED ORDER — GABAPENTIN 300 MG PO CAPS
300.0000 mg | ORAL_CAPSULE | Freq: Three times a day (TID) | ORAL | Status: DC
  Administered 2019-08-06 – 2019-08-07 (×3): 300 mg via ORAL
  Filled 2019-08-06 (×3): qty 1

## 2019-08-06 MED ORDER — MEPERIDINE HCL 50 MG/ML IJ SOLN: 6.2500 mg | INTRAMUSCULAR | Status: DC | PRN

## 2019-08-06 MED ORDER — BISACODYL 10 MG RE SUPP: 10.0000 mg | Freq: Every day | RECTAL | Status: DC | PRN

## 2019-08-06 MED ORDER — METHOCARBAMOL 500 MG IVPB - SIMPLE MED
500.0000 mg | Freq: Four times a day (QID) | INTRAVENOUS | Status: DC | PRN
  Filled 2019-08-06: qty 50

## 2019-08-06 MED ORDER — BUPIVACAINE LIPOSOME 1.3 % IJ SUSP
INTRAMUSCULAR | Status: DC | PRN
  Administered 2019-08-06: 20 mL

## 2019-08-06 MED ORDER — TRAMADOL HCL 50 MG PO TABS: 50.0000 mg | ORAL_TABLET | Freq: Four times a day (QID) | ORAL | Status: DC | PRN

## 2019-08-06 MED ORDER — PHENOL 1.4 % MT LIQD
1.0000 | OROMUCOSAL | Status: DC | PRN
  Filled 2019-08-06: qty 177

## 2019-08-06 MED ORDER — LATANOPROST 0.005 % OP SOLN
1.0000 [drp] | Freq: Every day | OPHTHALMIC | Status: DC
  Filled 2019-08-06: qty 2.5

## 2019-08-06 MED ORDER — DEXAMETHASONE SODIUM PHOSPHATE 10 MG/ML IJ SOLN
INTRAMUSCULAR | Status: AC
  Filled 2019-08-06: qty 1

## 2019-08-06 MED ORDER — HYDROMORPHONE HCL 1 MG/ML IJ SOLN: 0.2500 mg | INTRAMUSCULAR | Status: DC | PRN

## 2019-08-06 MED ORDER — METOCLOPRAMIDE HCL 5 MG/ML IJ SOLN: 5.0000 mg | Freq: Three times a day (TID) | INTRAMUSCULAR | Status: DC | PRN

## 2019-08-06 MED ORDER — BUPIVACAINE IN DEXTROSE 0.75-8.25 % IT SOLN
INTRATHECAL | Status: DC | PRN
  Administered 2019-08-06: 1.6 mL via INTRATHECAL

## 2019-08-06 MED ORDER — ASPIRIN EC 325 MG PO TBEC
325.0000 mg | DELAYED_RELEASE_TABLET | Freq: Two times a day (BID) | ORAL | Status: DC
  Administered 2019-08-07: 325 mg via ORAL
  Filled 2019-08-06: qty 1

## 2019-08-06 MED ORDER — ONDANSETRON HCL 4 MG PO TABS: 4.0000 mg | ORAL_TABLET | Freq: Four times a day (QID) | ORAL | Status: DC | PRN

## 2019-08-06 MED ORDER — FLEET ENEMA 7-19 GM/118ML RE ENEM: 1.0000 | ENEMA | Freq: Once | RECTAL | Status: DC | PRN

## 2019-08-06 MED ORDER — LEVOTHYROXINE SODIUM 75 MCG PO TABS
75.0000 ug | ORAL_TABLET | Freq: Every day | ORAL | Status: DC
  Administered 2019-08-07: 75 ug via ORAL
  Filled 2019-08-06: qty 1

## 2019-08-06 MED ORDER — ONDANSETRON HCL 4 MG/2ML IJ SOLN
INTRAMUSCULAR | Status: AC
  Filled 2019-08-06: qty 2

## 2019-08-06 MED ORDER — MORPHINE SULFATE (PF) 2 MG/ML IV SOLN: 0.5000 mg | INTRAVENOUS | Status: DC | PRN

## 2019-08-06 MED ORDER — MIDAZOLAM HCL 2 MG/2ML IJ SOLN
1.0000 mg | Freq: Once | INTRAMUSCULAR | Status: AC
  Administered 2019-08-06: 11:00:00 2 mg via INTRAVENOUS
  Filled 2019-08-06: qty 2

## 2019-08-06 MED ORDER — POLYETHYLENE GLYCOL 3350 17 G PO PACK: 17.0000 g | PACK | Freq: Every day | ORAL | Status: DC | PRN

## 2019-08-06 MED ORDER — DEXAMETHASONE SODIUM PHOSPHATE 10 MG/ML IJ SOLN
8.0000 mg | Freq: Once | INTRAMUSCULAR | Status: AC
  Administered 2019-08-06: 12:00:00 8 mg via INTRAVENOUS

## 2019-08-06 MED ORDER — CEFAZOLIN SODIUM-DEXTROSE 2-4 GM/100ML-% IV SOLN
2.0000 g | Freq: Four times a day (QID) | INTRAVENOUS | Status: AC
  Administered 2019-08-06 (×2): 2 g via INTRAVENOUS
  Filled 2019-08-06 (×2): qty 100

## 2019-08-06 MED ORDER — SODIUM CHLORIDE (PF) 0.9 % IJ SOLN
INTRAMUSCULAR | Status: AC
  Filled 2019-08-06: qty 50

## 2019-08-06 MED ORDER — LACTATED RINGERS IV SOLN
INTRAVENOUS | Status: DC
  Administered 2019-08-06: 10:00:00 via INTRAVENOUS

## 2019-08-06 MED ORDER — INSULIN ASPART 100 UNIT/ML ~~LOC~~ SOLN: 0.0000 [IU] | Freq: Three times a day (TID) | SUBCUTANEOUS | Status: DC

## 2019-08-06 MED ORDER — ACETAMINOPHEN 500 MG PO TABS
1000.0000 mg | ORAL_TABLET | Freq: Four times a day (QID) | ORAL | Status: DC
  Administered 2019-08-06 – 2019-08-07 (×3): 1000 mg via ORAL
  Filled 2019-08-06 (×3): qty 2

## 2019-08-06 SURGICAL SUPPLY — 57 items
ATTUNE MED DOME PAT 41 KNEE (Knees) ×1 IMPLANT
ATTUNE PS FEM RT SZ9 CEM KNEE (Femur) ×1 IMPLANT
ATTUNE PS RP INSR SZ9 8 KNEE (Femur) ×1 IMPLANT
BAG ZIPLOCK 12X15 (MISCELLANEOUS) ×2 IMPLANT
BASE TIBIAL ROT PLAT SZ 8 KNEE (Knees) IMPLANT
BLADE SAG 18X100X1.27 (BLADE) ×2 IMPLANT
BLADE SAW SGTL 11.0X1.19X90.0M (BLADE) ×2 IMPLANT
BLADE SURG SZ10 CARB STEEL (BLADE) ×4 IMPLANT
BNDG ELASTIC 6X5.8 VLCR STR LF (GAUZE/BANDAGES/DRESSINGS) ×2 IMPLANT
BOWL SMART MIX CTS (DISPOSABLE) ×2 IMPLANT
CEMENT HV SMART SET (Cement) ×4 IMPLANT
COVER SURGICAL LIGHT HANDLE (MISCELLANEOUS) ×2 IMPLANT
COVER WAND RF STERILE (DRAPES) ×1 IMPLANT
CUFF TOURN SGL QUICK 34 (TOURNIQUET CUFF) ×1
CUFF TRNQT CYL 34X4.125X (TOURNIQUET CUFF) ×1 IMPLANT
DECANTER SPIKE VIAL GLASS SM (MISCELLANEOUS) ×2 IMPLANT
DRAPE U-SHAPE 47X51 STRL (DRAPES) ×2 IMPLANT
DRSG ADAPTIC 3X8 NADH LF (GAUZE/BANDAGES/DRESSINGS) ×2 IMPLANT
DRSG PAD ABDOMINAL 8X10 ST (GAUZE/BANDAGES/DRESSINGS) ×2 IMPLANT
DURAPREP 26ML APPLICATOR (WOUND CARE) ×2 IMPLANT
ELECT REM PT RETURN 15FT ADLT (MISCELLANEOUS) ×2 IMPLANT
EVACUATOR 1/8 PVC DRAIN (DRAIN) ×2 IMPLANT
GAUZE SPONGE 4X4 12PLY STRL (GAUZE/BANDAGES/DRESSINGS) ×2 IMPLANT
GLOVE BIO SURGEON STRL SZ7 (GLOVE) ×2 IMPLANT
GLOVE BIO SURGEON STRL SZ8 (GLOVE) ×2 IMPLANT
GLOVE BIOGEL PI IND STRL 6.5 (GLOVE) ×1 IMPLANT
GLOVE BIOGEL PI IND STRL 7.0 (GLOVE) ×1 IMPLANT
GLOVE BIOGEL PI IND STRL 8 (GLOVE) ×1 IMPLANT
GLOVE BIOGEL PI INDICATOR 6.5 (GLOVE) ×1
GLOVE BIOGEL PI INDICATOR 7.0 (GLOVE) ×1
GLOVE BIOGEL PI INDICATOR 8 (GLOVE) ×1
GLOVE SURG SS PI 6.5 STRL IVOR (GLOVE) ×2 IMPLANT
GOWN STRL REUS W/TWL LRG LVL3 (GOWN DISPOSABLE) ×6 IMPLANT
HANDPIECE INTERPULSE COAX TIP (DISPOSABLE) ×1
HOLDER FOLEY CATH W/STRAP (MISCELLANEOUS) ×1 IMPLANT
IMMOBILIZER KNEE 20 (SOFTGOODS) ×2
IMMOBILIZER KNEE 20 THIGH 36 (SOFTGOODS) ×1 IMPLANT
KIT TURNOVER KIT A (KITS) IMPLANT
MANIFOLD NEPTUNE II (INSTRUMENTS) ×2 IMPLANT
NS IRRIG 1000ML POUR BTL (IV SOLUTION) ×2 IMPLANT
PACK TOTAL KNEE CUSTOM (KITS) ×2 IMPLANT
PADDING CAST COTTON 6X4 STRL (CAST SUPPLIES) ×5 IMPLANT
PIN DRILL FIX HALF THREAD (BIT) ×1 IMPLANT
PIN STEINMAN FIXATION KNEE (PIN) ×1 IMPLANT
PROTECTOR NERVE ULNAR (MISCELLANEOUS) ×2 IMPLANT
SET HNDPC FAN SPRY TIP SCT (DISPOSABLE) ×1 IMPLANT
STRIP CLOSURE SKIN 1/2X4 (GAUZE/BANDAGES/DRESSINGS) ×4 IMPLANT
SUT MNCRL AB 4-0 PS2 18 (SUTURE) ×2 IMPLANT
SUT STRATAFIX 0 PDS 27 VIOLET (SUTURE) ×2
SUT VIC AB 2-0 CT1 27 (SUTURE) ×3
SUT VIC AB 2-0 CT1 TAPERPNT 27 (SUTURE) ×3 IMPLANT
SUTURE STRATFX 0 PDS 27 VIOLET (SUTURE) ×1 IMPLANT
TIBIAL BASE ROT PLAT SZ 8 KNEE (Knees) ×2 IMPLANT
TRAY FOLEY MTR SLVR 16FR STAT (SET/KITS/TRAYS/PACK) ×2 IMPLANT
WATER STERILE IRR 1000ML POUR (IV SOLUTION) ×4 IMPLANT
WRAP KNEE MAXI GEL POST OP (GAUZE/BANDAGES/DRESSINGS) ×2 IMPLANT
YANKAUER SUCT BULB TIP 10FT TU (MISCELLANEOUS) ×2 IMPLANT

## 2019-08-06 NOTE — Progress Notes (Signed)
Assisted Dr. Hatchett with left, ultrasound guided, adductor canal block. Side rails up, monitors on throughout procedure. See vital signs in flow sheet. Tolerated Procedure well. 

## 2019-08-06 NOTE — Anesthesia Preprocedure Evaluation (Signed)
Anesthesia Evaluation  Patient identified by MRN, date of birth, ID band Patient awake    Reviewed: Allergy & Precautions, NPO status , Patient's Chart, lab work & pertinent test results  Airway Mallampati: I       Dental no notable dental hx. (+) Teeth Intact   Pulmonary Current Smoker and Patient abstained from smoking.,    Pulmonary exam normal breath sounds clear to auscultation       Cardiovascular hypertension, Pt. on medications Normal cardiovascular exam Rhythm:Regular Rate:Normal     Neuro/Psych negative neurological ROS  negative psych ROS   GI/Hepatic negative GI ROS, Neg liver ROS,   Endo/Other  diabetes, Type 2, Oral Hypoglycemic Agents  Renal/GU negative Renal ROS  negative genitourinary   Musculoskeletal  (+) Arthritis ,   Abdominal (+) + obese,   Peds  Hematology   Anesthesia Other Findings   Reproductive/Obstetrics                             Anesthesia Physical Anesthesia Plan  ASA: II  Anesthesia Plan: Spinal   Post-op Pain Management:  [facility] for Post-op pain   Induction:   PONV Risk Score and Plan: 0 and Midazolam, Ondansetron and Treatment may vary due to age or medical condition  Airway Management Planned: Nasal Cannula, Natural Airway and Simple Face Mask  Additional Equipment: None  Intra-op Plan:   Post-operative Plan:   Informed Consent: I have reviewed the patients History and Physical, chart, labs and discussed the procedure including the risks, benefits and alternatives for the proposed anesthesia with the patient or authorized representative who has indicated his/her understanding and acceptance.       Plan Discussed with: CRNA  Anesthesia Plan Comments:         Anesthesia Quick Evaluation

## 2019-08-06 NOTE — Anesthesia Procedure Notes (Signed)
Spinal  Patient location during procedure: OR Start time: 08/06/2019 11:36 AM End time: 08/06/2019 11:40 AM Staffing Anesthesiologist: Lyn Hollingshead, MD Performed: anesthesiologist  Preanesthetic Checklist Completed: patient identified, site marked, surgical consent, pre-op evaluation, timeout performed, IV checked, risks and benefits discussed and monitors and equipment checked Spinal Block Patient position: sitting Prep: site prepped and draped and DuraPrep Patient monitoring: continuous pulse ox and blood pressure Approach: midline Location: L3-4 Injection technique: single-shot Needle Needle type: Pencan  Needle gauge: 24 G Needle length: 10 cm Needle insertion depth: 6 cm Assessment Sensory level: T8

## 2019-08-06 NOTE — Evaluation (Signed)
Physical Therapy Evaluation Patient Details Name: Tim [name] MRN: [id number] DOB: February 12, 1956 Today's Date: 08/06/2019   History of Present Illness  63 yo male s/p R TKR on 08/06/19. PMH includes L TKR with manipulation.  Clinical Impression  Pt presents with R knee pain, decreased R knee strength and ROM post-operatively, increased time and effort to perform mobility tasks, and decreased activity tolerance. Pt to benefit from acute PT to address deficits. Pt ambulated 120 ft with RW with min guard assist, verbal cuing for sequencing and form. Pt educated on ankle pumps (20/hour) to perform this afternoon/evening to increase circulation, to pt's tolerance and limited by pain. PT to progress mobility as tolerated, and will continue to follow acutely.        Follow Up Recommendations Follow surgeon's recommendation for DC plan and follow-up therapies;Supervision for mobility/OOB    Equipment Recommendations  3in1 (PT)    Recommendations for Other Services       Precautions / Restrictions Precautions Precautions: Fall Restrictions Weight Bearing Restrictions: No RLE Weight Bearing: Weight bearing as tolerated      Mobility  Bed Mobility Overal bed mobility: Needs Assistance Bed Mobility: Supine to Sit     Supine to sit: HOB elevated;Min guard     General bed mobility comments: min gaurd for safety, verbal cuing for sequencing to EOB.  Transfers Overall transfer level: Needs assistance Equipment used: Rolling walker (2 wheeled) Transfers: Sit to/from Stand Sit to Stand: Min guard;From elevated surface         General transfer comment: Min guard for safety, verbal cuing for hand placement when rising.  Ambulation/Gait Ambulation/Gait assistance: Min guard Gait Distance (Feet): 120 Feet Assistive device: Rolling walker (2 wheeled) Gait Pattern/deviations: Step-to pattern;Step-through pattern;Decreased stride length;Trunk flexed Gait velocity: decr   General Gait  Details: min guard for safety, verbal cuing for sequencing, placement in RW, upright posture.  Stairs            Wheelchair Mobility    Modified Rankin (Stroke Patients Only)       Balance Overall balance assessment: Mild deficits observed, not formally tested                                           Pertinent Vitals/Pain Pain Assessment: 0-10 Pain Score: 2  Pain Location: R knee Pain Descriptors / Indicators: Sore Pain Intervention(s): Limited activity within patient's tolerance;Monitored during session;Premedicated before session;Repositioned    Home Living Family/patient expects to be discharged to:: Private residence Living Arrangements: Spouse/significant other Available Help at Discharge: Family;Available 24 hours/day Type of Home: House Home Access: Stairs to enter Entrance Stairs-Rails: Psychiatric nurse of Steps: 2 Home Layout: One level Home Equipment: Walker - 2 wheels;Cane - single point      Prior Function Level of Independence: Independent               Hand Dominance   Dominant Hand: Right    Extremity/Trunk Assessment   Upper Extremity Assessment Upper Extremity Assessment: Overall WFL for tasks assessed    Lower Extremity Assessment Lower Extremity Assessment: Overall WFL for tasks assessed;RLE deficits/detail RLE Deficits / Details: suspected post-surgical weakness; able to perform ankle pumps, quad set, heel slides to 75*, SLR without lift assist and ~10* quad lag RLE Sensation: WNL    Cervical / Trunk Assessment Cervical / Trunk Assessment: Normal  Communication   Communication: No difficulties  Cognition Arousal/Alertness: Awake/alert Behavior During Therapy: WFL for tasks assessed/performed Overall Cognitive Status: Within Functional Limits for tasks assessed                                        General Comments      Exercises     Assessment/Plan    PT  Assessment Patient needs continued PT services  PT Problem List Decreased strength;Decreased mobility;Decreased range of motion;Decreased activity tolerance;Decreased balance;Decreased knowledge of use of DME;Pain       PT Treatment Interventions DME instruction;Therapeutic activities;Gait training;Therapeutic exercise;Patient/family education;Balance training;Stair training;Functional mobility training    PT Goals (Current goals can be found in the Care Plan section)  Acute Rehab PT Goals PT Goal Formulation: With patient Time For Goal Achievement: 08/13/19 Potential to Achieve Goals: Good    Frequency 7X/week   Barriers to discharge        Co-evaluation               AM-PAC PT "6 Clicks" Mobility  Outcome Measure Help needed turning from your back to your side while in a flat bed without using bedrails?: A Little Help needed moving from lying on your back to sitting on the side of a flat bed without using bedrails?: A Little Help needed moving to and from a bed to a chair (including a wheelchair)?: A Little Help needed standing up from a chair using your arms (e.g., wheelchair or bedside chair)?: A Little Help needed to walk in hospital room?: A Little Help needed climbing 3-5 steps with a railing? : A Little 6 Click Score: 18    End of Session Equipment Utilized During Treatment: Gait belt;Right knee immobilizer Activity Tolerance: Patient tolerated treatment well Patient left: in chair;with chair alarm set;with SCD's reapplied;with call bell/phone within reach;with family/visitor present Nurse Communication: Mobility status PT Visit Diagnosis: Difficulty in walking, not elsewhere classified (R26.2);Other abnormalities of gait and mobility (R26.89)    Time: [id number] PT Time Calculation (min) (ACUTE ONLY): 19 min   Charges:   PT Evaluation $PT Eval Low Complexity: 1 Low        Salley Boxley Terrial Rhodes Letanya Froh, PT Acute Rehabilitation Services Pager [phone]  Office  [phone]  Keiden Deskin D Despina Hiddenure 08/06/2019, 7:26 PM

## 2019-08-06 NOTE — Transfer of Care (Signed)
Immediate Anesthesia Transfer of Care Note  Patient: Tim [name]  Procedure(s) Performed: TOTAL KNEE ARTHROPLASTY (Right Knee)  Patient Location: PACU  Anesthesia Type:Spinal  Level of Consciousness: awake, alert , oriented and patient cooperative  Airway & Oxygen Therapy: Patient Spontanous Breathing and Patient connected to face mask oxygen  Post-op Assessment: Report given to RN and Post -op Vital signs reviewed and stable  Post vital signs: Reviewed and stable  Last Vitals:  Vitals Value Taken Time  BP    Temp    Pulse 60 08/06/19 1341  Resp 9 08/06/19 1341  SpO2 100 % 08/06/19 1341  Vitals shown include unvalidated device data.  Last Pain:  Vitals:   08/06/19 1017  TempSrc: Oral      Patients Stated Pain Goal: 3 (27/78/24 2353)  Complications: No apparent anesthesia complications

## 2019-08-06 NOTE — Anesthesia Postprocedure Evaluation (Signed)
Anesthesia Post Note  Patient: Tim [name]  Procedure(s) Performed: TOTAL KNEE ARTHROPLASTY (Right Knee)     Patient location during evaluation: PACU Anesthesia Type: Spinal Level of consciousness: sedated Pain management: pain level controlled Vital Signs Assessment: post-procedure vital signs reviewed and stable Respiratory status: spontaneous breathing Cardiovascular status: stable Postop Assessment: no headache, no backache, spinal receding, patient able to bend at knees and no apparent nausea or vomiting Anesthetic complications: no    Last Vitals:  Vitals:   08/06/19 1530 08/06/19 1545  BP: 110/75 112/73  Pulse: (!) 43 (!) 45  Resp: (!) 6 (!) 9  Temp: (!) (P) 35.6 C   SpO2: 100% 100%    Last Pain:  Vitals:   08/06/19 1545  TempSrc:   PainSc: Asleep   Pain Goal: Patients Stated Pain Goal: 3 (08/06/19 0940)  LLE Motor Response: Purposeful movement (08/06/19 1530)   RLE Motor Response: Purposeful movement (08/06/19 1530)   L Sensory Level: L2-Upper inner thigh, upper buttock (08/06/19 1530) R Sensory Level: L2-Upper inner thigh, upper buttock (08/06/19 1530) Epidural/Spinal Function Patient able to flex knees: Yes (08/06/19 1530), Patient able to lift hips off bed: No (08/06/19 1530), Back pain beyond tenderness at insertion site: No (08/06/19 1530), Progressively worsening motor and/or sensory loss: No (08/06/19 1530), Bowel and/or bladder incontinence post epidural: No (08/06/19 1530)  Huston Foley

## 2019-08-06 NOTE — Anesthesia Procedure Notes (Signed)
Anesthesia Regional Block: Adductor canal block   Pre-Anesthetic Checklist: ,, timeout performed, Correct Patient, Correct Site, Correct Laterality, Correct Procedure, Correct Position, site marked, Risks and benefits discussed,  Surgical consent,  Pre-op evaluation,  At surgeon's request and post-op pain management  Laterality: Lower and Right  Prep: chloraprep       Needles:  Injection technique: Single-shot  Needle Type: Echogenic Stimulator Needle     Needle Length: 10cm  Needle Gauge: 21   Needle insertion depth: 2 cm   Additional Needles:   Procedures:,,,, ultrasound used (permanent image in chart),,,,  Narrative:  Start time: 08/06/2019 11:10 AM End time: 08/06/2019 11:20 AM Injection made incrementally with aspirations every 5 mL.  Performed by: Personally  Anesthesiologist: Lyn Hollingshead, MD

## 2019-08-06 NOTE — Interval H&P Note (Signed)
History and Physical Interval Note:  08/06/2019 10:31 AM  Jenean Lindau  has presented today for surgery, with the diagnosis of right knee osteoarthritis.  The various methods of treatment have been discussed with the patient and family. After consideration of risks, benefits and other options for treatment, the patient has consented to  Procedure(s) with comments: TOTAL KNEE ARTHROPLASTY (Right) - 64min as a surgical intervention.  The patient's history has been reviewed, patient examined, no change in status, stable for surgery.  I have reviewed the patient's chart and labs.  Questions were answered to the patient's satisfaction.     Pilar Plate Charlene Detter

## 2019-08-06 NOTE — Discharge Instructions (Signed)
° °Dr. Frank Aluisio °Total Joint Specialist °[facility] °[address] °[place], [place] [address] °[phone] ° °TOTAL KNEE REPLACEMENT POSTOPERATIVE DIRECTIONS ° °Knee Rehabilitation, Guidelines Following Surgery  °Results after knee surgery are often greatly improved when you follow the exercise, range of motion and muscle strengthening exercises prescribed by your doctor. Safety measures are also important to protect the knee from further injury. Any time any of these exercises cause you to have increased pain or swelling in your knee joint, decrease the amount until you are comfortable again and slowly increase them. If you have problems or questions, call your caregiver or physical therapist for advice.  ° °HOME CARE INSTRUCTIONS  °• Remove items at home which could result in a fall. This includes throw rugs or furniture in walking pathways.  °· ICE to the affected knee every three hours for 30 minutes at a time and then as needed for pain and swelling.  Continue to use ice on the knee for pain and swelling from surgery. You may notice swelling that will progress down to the foot and ankle.  This is normal after surgery.  Elevate the leg when you are not up walking on it.   °· Continue to use the breathing machine which will help keep your temperature down.  It is common for your temperature to cycle up and down following surgery, especially at night when you are not up moving around and exerting yourself.  The breathing machine keeps your lungs expanded and your temperature down. °· Do not place pillow under knee, focus on keeping the knee straight while resting ° °DIET °You may resume your previous home diet once your are discharged from the hospital. ° °DRESSING / WOUND CARE / SHOWERING °You may change your dressing 3-5 days after surgery.  Then change the dressing every day with sterile gauze.  Please use good hand washing techniques before changing the dressing.  Do not use any lotions  or creams on the incision until instructed by your surgeon. °You may start showering once you are discharged home but do not submerge the incision under water. Just pat the incision dry and apply a dry gauze dressing on daily. °Change the surgical dressing daily and reapply a dry dressing each time. ° °ACTIVITY °Walk with your walker as instructed. °Use walker as long as suggested by your caregivers. °Avoid periods of inactivity such as sitting longer than an hour when not asleep. This helps prevent blood clots.  °You may resume a sexual relationship in one month or when given the OK by your doctor.  °You may return to work once you are cleared by your doctor.  °Do not drive a car for 6 weeks or until released by you surgeon.  °Do not drive while taking narcotics. ° °WEIGHT BEARING °Weight bearing as tolerated with assist device (walker, cane, etc) as directed, use it as long as suggested by your surgeon or therapist, typically at least 4-6 weeks. ° °POSTOPERATIVE CONSTIPATION PROTOCOL °Constipation - defined medically as fewer than three stools per week and severe constipation as less than one stool per week. ° °One of the most common issues patients have following surgery is constipation.  Even if you have a regular bowel pattern at home, your normal regimen is likely to be disrupted due to multiple reasons following surgery.  Combination of anesthesia, postoperative narcotics, change in appetite and fluid intake all can affect your bowels.  In order to avoid complications following surgery, here are some   recommendations in order to help you during your recovery period. ° °Colace (docusate) - Pick up an over-the-counter form of Colace or another stool softener and take twice a day as long as you are requiring postoperative pain medications.  Take with a full glass of water daily.  If you experience loose stools or diarrhea, hold the colace until you stool forms back up.  If your symptoms do not get better within 1  week or if they get worse, check with your doctor. ° °Dulcolax (bisacodyl) - Pick up over-the-counter and take as directed by the product packaging as needed to assist with the movement of your bowels.  Take with a full glass of water.  Use this product as needed if not relieved by Colace only.  ° °MiraLax (polyethylene glycol) - Pick up over-the-counter to have on hand.  MiraLax is a solution that will increase the amount of water in your bowels to assist with bowel movements.  Take as directed and can mix with a glass of water, juice, soda, coffee, or tea.  Take if you go more than two days without a movement. °Do not use MiraLax more than once per day. Call your doctor if you are still constipated or irregular after using this medication for 7 days in a row. ° °If you continue to have problems with postoperative constipation, please contact the office for further assistance and recommendations.  If you experience "the worst abdominal pain ever" or develop nausea or vomiting, please contact the office immediatly for further recommendations for treatment. ° °ITCHING °If you experience itching with your medications, try taking only a single pain pill, or even half a pain pill at a time.  You can also use Benadryl over the counter for itching or also to help with sleep.  ° °TED HOSE STOCKINGS °Wear the elastic stockings on both legs for three weeks following surgery during the day but you may remove then at night for sleeping. ° °MEDICATIONS °See your medication summary on the “After Visit Summary” that the nursing staff will review with you prior to discharge.  You may have some home medications which will be placed on hold until you complete the course of blood thinner medication.  It is important for you to complete the blood thinner medication as prescribed by your surgeon.  Continue your approved medications as instructed at time of discharge. ° °PRECAUTIONS °If you experience chest pain or shortness of breath -  call 911 immediately for transfer to the hospital emergency department.  °If you develop a fever greater that 101 F, purulent drainage from wound, increased redness or drainage from wound, foul odor from the wound/dressing, or calf pain - CONTACT YOUR SURGEON.   °                                                °FOLLOW-UP APPOINTMENTS °Make sure you keep all of your appointments after your operation with your surgeon and caregivers. You should call the office at the above phone number and make an appointment for approximately two weeks after the date of your surgery or on the date instructed by your surgeon outlined in the "After Visit Summary". ° °RANGE OF MOTION AND STRENGTHENING EXERCISES  °Rehabilitation of the knee is important following a knee injury or an operation. After just a few days of immobilization, the muscles of   the thigh which control the knee become weakened and shrink (atrophy). Knee exercises are designed to build up the tone and strength of the thigh muscles and to improve knee motion. Often times heat used for twenty to thirty minutes before working out will loosen up your tissues and help with improving the range of motion but do not use heat for the first two weeks following surgery. These exercises can be done on a training (exercise) mat, on the floor, on a table or on a bed. Use what ever works the best and is most comfortable for you Knee exercises include:  °• Leg Lifts - While your knee is still immobilized in a splint or cast, you can do straight leg raises. Lift the leg to 60 degrees, hold for 3 sec, and slowly lower the leg. Repeat 10-20 times 2-3 times daily. Perform this exercise against resistance later as your knee gets better.  °• Quad and Hamstring Sets - Tighten up the muscle on the front of the thigh (Quad) and hold for 5-10 sec. Repeat this 10-20 times hourly. Hamstring sets are done by pushing the foot backward against an object and holding for 5-10 sec. Repeat as with quad  sets.  °· Leg Slides: Lying on your back, slowly slide your foot toward your buttocks, bending your knee up off the floor (only go as far as is comfortable). Then slowly slide your foot back down until your leg is flat on the floor again. °· Angel Wings: Lying on your back spread your legs to the side as far apart as you can without causing discomfort.  °A rehabilitation program following serious knee injuries can speed recovery and prevent re-injury in the future due to weakened muscles. Contact your doctor or a physical therapist for more information on knee rehabilitation.  ° °IF YOU ARE TRANSFERRED TO A SKILLED REHAB FACILITY °If the patient is transferred to a skilled rehab facility following release from the hospital, a list of the current medications will be sent to the facility for the patient to continue.  When discharged from the skilled rehab facility, please have the facility set up the patient's Home Health Physical Therapy prior to being released. Also, the skilled facility will be responsible for providing the patient with their medications at time of release from the facility to include their pain medication, the muscle relaxants, and their blood thinner medication. If the patient is still at the rehab facility at time of the two week follow up appointment, the skilled rehab facility will also need to assist the patient in arranging follow up appointment in our office and any transportation needs. ° °MAKE SURE YOU:  °• Understand these instructions.  °• Get help right away if you are not doing well or get worse.  ° ° °Pick up stool softner and laxative for home use following surgery while on pain medications. °Do not submerge incision under water. °Please use good hand washing techniques while changing dressing each day. °May shower starting three days after surgery. °Please use a clean towel to pat the incision dry following showers. °Continue to use ice for pain and swelling after surgery. °Do not  use any lotions or creams on the incision until instructed by your surgeon. ° °

## 2019-08-06 NOTE — Op Note (Signed)
OPERATIVE REPORT-TOTAL KNEE ARTHROPLASTY   Pre-operative diagnosis- Osteoarthritis  Right knee(s)  Post-operative diagnosis- Osteoarthritis Right knee(s)  Procedure-  Right  Total Knee Arthroplasty  Surgeon- Dione Plover. Dorrell Mitcheltree, MD  Assistant- Theresa Duty, PA-C   Anesthesia-  Adductor canal block and spinal  EBL-50 mL   Drains Hemovac  Tourniquet time-  Total Tourniquet Time Documented: Thigh (Right) - 61 minutes Total: Thigh (Right) - 61 minutes     Complications- None  Condition-PACU - hemodynamically stable.   Brief Clinical Note  Tim [name] is a 63 y.o. year old male with end stage OA of his right knee with progressively worsening pain and dysfunction. He has constant pain, with activity and at rest and significant functional deficits with difficulties even with ADLs. He has had extensive non-op management including analgesics, injections of cortisone and viscosupplements, and home exercise program, but remains in significant pain with significant dysfunction. Radiographs show bone on bone arthritis medial and patellofemoral. He presents now for right Total Knee Arthroplasty.    Procedure in detail---   The patient is brought into the operating room and positioned supine on the operating table. After successful administration of  Adductor canal block and spinal,   a tourniquet is placed high on the  Right thigh(s) and the lower extremity is prepped and draped in the usual sterile fashion. Time out is performed by the operating team and then the  Right lower extremity is wrapped in Esmarch, knee flexed and the tourniquet inflated to 300 mmHg.       A midline incision is made with a ten blade through the subcutaneous tissue to the level of the extensor mechanism. A fresh blade is used to make a medial parapatellar arthrotomy. Soft tissue over the proximal medial tibia is subperiosteally elevated to the joint line with a knife and into the semimembranosus bursa with a Cobb  elevator. Soft tissue over the proximal lateral tibia is elevated with attention being paid to avoiding the patellar tendon on the tibial tubercle. The patella is everted, knee flexed 90 degrees and the ACL and PCL are removed. Findings are bone on bone all 3 compartments with massive global osteophytes        The drill is used to create a starting hole in the distal femur and the canal is thoroughly irrigated with sterile saline to remove the fatty contents. The 5 degree Right  valgus alignment guide is placed into the femoral canal and the distal femoral cutting block is pinned to remove 9 mm off the distal femur. Resection is made with an oscillating saw.      The tibia is subluxed forward and the menisci are removed. The extramedullary alignment guide is placed referencing proximally at the medial aspect of the tibial tubercle and distally along the second metatarsal axis and tibial crest. The block is pinned to remove 91mm off the more deficient medial  side. Resection is made with an oscillating saw. Size 8is the most appropriate size for the tibia and the proximal tibia is prepared with the modular drill and keel punch for that size.      The femoral sizing guide is placed and size 9 is most appropriate. Rotation is marked off the epicondylar axis and confirmed by creating a rectangular flexion gap at 90 degrees. The size 9 cutting block is pinned in this rotation and the anterior, posterior and chamfer cuts are made with the oscillating saw. The intercondylar block is then placed and that cut is made.  Trial size 8 tibial component, trial size 9 posterior stabilized femur and a 8  mm posterior stabilized rotating platform insert trial is placed. Full extension is achieved with excellent varus/valgus and anterior/posterior balance throughout full range of motion. The patella is everted and thickness measured to be 28  mm. Free hand resection is taken to 16 mm, a 41 template is placed, lug holes are  drilled, trial patella is placed, and it tracks normally. Osteophytes are removed off the posterior femur with the trial in place. All trials are removed and the cut bone surfaces prepared with pulsatile lavage. Cement is mixed and once ready for implantation, the size 8 tibial implant, size  9 posterior stabilized femoral component, and the size 41 patella are cemented in place and the patella is held with the clamp. The trial insert is placed and the knee held in full extension. The Exparel (20 ml mixed with 60 ml saline) is injected into the extensor mechanism, posterior capsule, medial and lateral gutters and subcutaneous tissues.  All extruded cement is removed and once the cement is hard the permanent 8 mm posterior stabilized rotating platform insert is placed into the tibial tray.      The wound is copiously irrigated with saline solution and the extensor mechanism closed over a hemovac drain with #1 V-loc suture. The tourniquet is released for a total tourniquet time of 61  minutes. Flexion against gravity is 140 degrees and the patella tracks normally. Subcutaneous tissue is closed with 2.0 vicryl and subcuticular with running 4.0 Monocryl. The incision is cleaned and dried and steri-strips and a bulky sterile dressing are applied. The limb is placed into a knee immobilizer and the patient is awakened and transported to recovery in stable condition.      Please note that a surgical assistant was a medical necessity for this procedure in order to perform it in a safe and expeditious manner. Surgical assistant was necessary to retract the ligaments and vital neurovascular structures to prevent injury to them and also necessary for proper positioning of the limb to allow for anatomic placement of the prosthesis.   Gus RankinFrank V. Kairos Panetta, MD    08/06/2019, 1:10 PM

## 2019-08-07 ENCOUNTER — Encounter (HOSPITAL_COMMUNITY): Payer: Self-pay | Admitting: Orthopedic Surgery

## 2019-08-07 LAB — CBC
HCT: 33.5 % — ABNORMAL LOW (ref 39.0–52.0)
Hemoglobin: 10.4 g/dL — ABNORMAL LOW (ref 13.0–17.0)
MCH: 25.1 pg — ABNORMAL LOW (ref 26.0–34.0)
MCHC: 31 g/dL (ref 30.0–36.0)
MCV: 80.9 fL (ref 80.0–100.0)
Platelets: 176 10*3/uL (ref 150–400)
RBC: 4.14 MIL/uL — ABNORMAL LOW (ref 4.22–5.81)
RDW: 16.9 % — ABNORMAL HIGH (ref 11.5–15.5)
WBC: 8.7 10*3/uL (ref 4.0–10.5)
nRBC: 0 % (ref 0.0–0.2)

## 2019-08-07 LAB — BASIC METABOLIC PANEL
Anion gap: 7 (ref 5–15)
BUN: 17 mg/dL (ref 8–23)
CO2: 24 mmol/L (ref 22–32)
Calcium: 8.3 mg/dL — ABNORMAL LOW (ref 8.9–10.3)
Chloride: 103 mmol/L (ref 98–111)
Creatinine, Ser: 1.46 mg/dL — ABNORMAL HIGH (ref 0.61–1.24)
GFR calc Af Amer: 58 mL/min — ABNORMAL LOW (ref >60)
GFR calc non Af Amer: 50 mL/min — ABNORMAL LOW (ref >60)
Glucose, Bld: 173 mg/dL — ABNORMAL HIGH (ref 70–99)
Potassium: 4.8 mmol/L (ref 3.5–5.1)
Sodium: 134 mmol/L — ABNORMAL LOW (ref 135–145)

## 2019-08-07 LAB — GLUCOSE, CAPILLARY
Glucose-Capillary: 104 mg/dL — ABNORMAL HIGH (ref 70–99)
Glucose-Capillary: 169 mg/dL — ABNORMAL HIGH (ref 70–99)

## 2019-08-07 MED ORDER — ASPIRIN 325 MG PO TBEC: 325.0000 mg | DELAYED_RELEASE_TABLET | Freq: Two times a day (BID) | ORAL | 0 refills | Status: AC

## 2019-08-07 MED ORDER — GABAPENTIN 300 MG PO CAPS: 300.0000 mg | ORAL_CAPSULE | Freq: Three times a day (TID) | ORAL | 0 refills | Status: AC

## 2019-08-07 MED ORDER — TRAMADOL HCL 50 MG PO TABS: 50.0000 mg | ORAL_TABLET | Freq: Four times a day (QID) | ORAL | 0 refills | Status: AC | PRN

## 2019-08-07 MED ORDER — METHOCARBAMOL 500 MG PO TABS: 500.0000 mg | ORAL_TABLET | Freq: Four times a day (QID) | ORAL | 0 refills | Status: AC | PRN

## 2019-08-07 MED ORDER — OXYCODONE HCL 5 MG PO TABS: 5.0000 mg | ORAL_TABLET | Freq: Four times a day (QID) | ORAL | 0 refills | Status: AC | PRN

## 2019-08-07 NOTE — TOC Initial Note (Signed)
Transition of Care [facility]) - Initial/Assessment Note    Patient Details  Name: Tim [name] MRN: [id number] Date of Birth: 12/25/1955  Transition of Care [facility]) CM/SW Contact:    Joaquin Courts, RN Phone Number: 08/07/2019, 10:29 AM  Clinical Narrative:   CM spoke with patient at bedside, plan for OPPT. Patient reports he has a rolling walker, [employer] arranged to deliver 3-in-1 to bedside for home use.                  Expected Discharge Plan: Home/Self Care Barriers to Discharge: No Barriers Identified   Patient Goals and CMS Choice        Expected Discharge Plan and Services Expected Discharge Plan: Home/Self Care   Discharge Planning Services: CM Consult   Living arrangements for the past 2 months: Single Family Home Expected Discharge Date: 08/07/19               DME Arranged: 3-N-1 DME Agency: [employer] Date DME Agency Contacted: 08/07/19 Time DME Agency Contacted: 2130 Representative spoke with at DME Agency: [place]: NA [place] Agency: NA        Prior Living Arrangements/Services Living arrangements for the past 2 months: [place] with:: Spouse Patient language and need for interpreter reviewed:: Yes Do you feel safe going back to the place where you live?: Yes      Need for Family Participation in Patient Care: Yes (Comment) Care giver support system in place?: Yes (comment)   Criminal Activity/Legal Involvement Pertinent to Current Situation/Hospitalization: No - Comment as needed  Activities of Daily Living Home Assistive Devices/Equipment: Walker (specify type), Cane (specify quad or straight), Eyeglasses, Blood pressure cuff ADL Screening (condition at time of admission) Patient's cognitive ability adequate to safely complete daily activities?: Yes Is the patient deaf or have difficulty hearing?: No Does the patient have difficulty seeing, even when wearing glasses/contacts?: No Does the patient have difficulty  concentrating, remembering, or making decisions?: No Patient able to express need for assistance with ADLs?: Yes Does the patient have difficulty dressing or bathing?: No Independently performs ADLs?: Yes (appropriate for developmental age) Does the patient have difficulty walking or climbing stairs?: Yes Weakness of Legs: Right Weakness of Arms/Hands: None  Permission Sought/Granted                  Emotional Assessment Appearance:: Appears stated age Attitude/Demeanor/Rapport: Engaged Affect (typically observed): Accepting Orientation: : Oriented to Self, Oriented to Place, Oriented to  Time, Oriented to Situation   Psych Involvement: No (comment)  Admission diagnosis:  right knee osteoarthritis Patient Active Problem List   Diagnosis Date Noted  . Osteoarthritis of right knee 08/06/2019  . Postoperative stiffness of total knee replacement ([place]) 10/12/2015  . OA (osteoarthritis) of knee 07/28/2015   PCP:  Merrilee Seashore, MD Pharmacy:   [id number] - [place], [place] [address] Phone: [phone] Fax: [phone]     Social Determinants of Health (SDOH) Interventions    Readmission Risk Interventions No flowsheet data found.

## 2019-08-07 NOTE — Progress Notes (Signed)
   Subjective: 1 Day Post-Op Procedure(s) (LRB): TOTAL KNEE ARTHROPLASTY (Right) Patient reports pain as mild.   Patient seen in rounds by Dr. Wynelle Link. Patient is well, and has had no acute complaints or problems. States he is ready to go home. Denies chest pain, SOB, or calf pain. Foley catheter removed this AM. No issues overnight.  We will continue therapy today.   Objective: Vital signs in last 24 hours: Temp:  [96 F (35.6 C)-98.6 F (37 C)] 97.9 F (36.6 C) (08/18 0412) Pulse Rate:  [43-83] 54 (08/18 0412) Resp:  [0-18] 18 (08/18 0412) BP: (105-151)/(67-99) 112/71 (08/18 0412) SpO2:  [98 %-100 %] 100 % (08/18 0412) FiO2 (%):  [2 %] 2 % (08/17 1110) Weight:  [122.7 kg] 122.7 kg (08/17 1758)  Intake/Output from previous day:  Intake/Output Summary (Last 24 hours) at 08/07/2019 0753 Last data filed at 08/07/2019 0600 Gross per 24 hour  Intake 3901.68 ml  Output 1680 ml  Net 2221.68 ml    Labs: Recent Labs    08/07/19 0237  HGB 10.4*   Recent Labs    08/07/19 0237  WBC 8.7  RBC 4.14*  HCT 33.5*  PLT 176   Recent Labs    08/07/19 0237  NA 134*  K 4.8  CL 103  CO2 24  BUN 17  CREATININE 1.46*  GLUCOSE 173*  CALCIUM 8.3*   Exam: General - Patient is Alert and Oriented Extremity - Neurologically intact Neurovascular intact Sensation intact distally Dorsiflexion/Plantar flexion intact Dressing - dressing C/D/I Motor Function - intact, moving foot and toes well on exam.   Past Medical History:  Diagnosis Date  . Arthritis   . Hypertension     Assessment/Plan: 1 Day Post-Op Procedure(s) (LRB): TOTAL KNEE ARTHROPLASTY (Right) Principal Problem:   OA (osteoarthritis) of knee Active Problems:   Osteoarthritis of right knee  Estimated body mass index is 33.8 kg/m as calculated from the following:   Height as of this encounter: 6\' 3"  (1.905 m).   Weight as of this encounter: 122.7 kg. Advance diet Up with therapy D/C IV fluids  Anticipated  LOS equal to or greater than 2 midnights due to - Age 76 and older with one or more of the following:  - Obesity  - Expected need for hospital services (PT, OT, Nursing) required for safe  discharge  - Anticipated need for postoperative skilled nursing care or inpatient rehab  - Active co-morbidities: Diabetes OR   - Unanticipated findings during/Post Surgery: None  - Patient is a high risk of re-admission due to: None    DVT Prophylaxis - Aspirin Weight bearing as tolerated. D/C O2 and pulse ox and try on room air. Hemovac pulled without difficulty, will continue therapy today.  Plan is to go Home after hospital stay. Plan for discharge once meeting goals with therapy. Scheduled for outpatient PT at [facility] Follow-up in the office in 2 weeks.   Theresa Duty, PA-C Orthopedic Surgery 08/07/2019, 7:53 AM

## 2019-08-07 NOTE — Progress Notes (Signed)
Physical Therapy Treatment Patient Details Name: Kathleene Hazellonzo Hum MRN: [id number] DOB: [birth year] Today's Date: 08/07/2019    History of Present Illness 63 yo male s/p R TKR on 08/06/19. PMH includes L TKR with manipulation.    PT Comments    Progressing very well with  Mobility. Instructed pt to perform exercises at home until he begins OP PT. All education completed. Okay to d/c from PT standpoint.    Follow Up Recommendations  Follow surgeon's recommendation for DC plan and follow-up therapies;Supervision for mobility/OOB     Equipment Recommendations  3in1 (PT)    Recommendations for Other Services       Precautions / Restrictions Precautions Precautions: Fall Restrictions Weight Bearing Restrictions: No RLE Weight Bearing: Weight bearing as tolerated    Mobility  Bed Mobility Overal bed mobility: Needs Assistance Bed Mobility: Supine to Sit     Supine to sit: Supervision;HOB elevated     General bed mobility comments: for safety  Transfers Overall transfer level: Needs assistance Equipment used: Rolling walker (2 wheeled) Transfers: Sit to/from Stand Sit to Stand: Supervision         General transfer comment: for safety. VCs LE placement  Ambulation/Gait Ambulation/Gait assistance: Supervision Gait Distance (Feet): 200 Feet Assistive device: Rolling walker (2 wheeled) Gait Pattern/deviations: Step-through pattern;Decreased stride length     General Gait Details: for safety.   Stairs Stairs: Yes Stairs assistance: Min guard Stair Management: Forwards;Two rails;Step to pattern Number of Stairs: 3 General stair comments: up and over gym steps. VCs safety, sequence, technique. Close guard for safety.   Wheelchair Mobility    Modified Rankin (Stroke Patients Only)       Balance                                            Cognition Arousal/Alertness: Awake/alert Behavior During Therapy: WFL for tasks  assessed/performed Overall Cognitive Status: Within Functional Limits for tasks assessed                                        Exercises Total Joint Exercises Ankle Circles/Pumps: AROM;Both;10 reps;Supine Quad Sets: AROM;Both;10 reps;Supine Hip ABduction/ADduction: AROM;Right;10 reps;Supine Straight Leg Raises: AROM;Right;10 reps;Supine Knee Flexion: AROM;Right;10 reps;Seated Goniometric ROM: ~10-70 degrees    General Comments        Pertinent Vitals/Pain Pain Assessment: 0-10 Pain Score: 7  Pain Location: R knee Pain Descriptors / Indicators: Sore Pain Intervention(s): Monitored during session    Home Living                      Prior Function            PT Goals (current goals can now be found in the care plan section) Progress towards PT goals: Progressing toward goals    Frequency    7X/week      PT Plan Current plan remains appropriate    Co-evaluation              AM-PAC PT "6 Clicks" Mobility   Outcome Measure  Help needed turning from your back to your side while in a flat bed without using bedrails?: A Little Help needed moving from lying on your back to sitting on the side of a flat bed without using bedrails?: A Little Help  needed moving to and from a bed to a chair (including a wheelchair)?: A Little Help needed standing up from a chair using your arms (e.g., wheelchair or bedside chair)?: A Little Help needed to walk in hospital room?: A Little Help needed climbing 3-5 steps with a railing? : A Little 6 Click Score: 18    End of Session Equipment Utilized During Treatment: Gait belt Activity Tolerance: Patient tolerated treatment well Patient left: in chair;with call bell/phone within reach   PT Visit Diagnosis: Difficulty in walking, not elsewhere classified (R26.2);Other abnormalities of gait and mobility (R26.89)     Time: 2633-3545 PT Time Calculation (min) (ACUTE ONLY): 20 min  Charges:  $Gait  Training: 8-22 mins                        Weston Anna, PT Acute Rehabilitation Services Pager: [phone] Office: [phone]

## 2019-08-07 NOTE — Plan of Care (Signed)
Education complete.

## 2019-08-07 NOTE — Progress Notes (Signed)
RN reviewed discharge instructions with patient and family. All questions answered.    Paperwork given to patient.   Prescriptions sent to pharmacy.     All questions answered.

## 2019-08-08 NOTE — Discharge Summary (Signed)
Physician Discharge Summary   Patient ID: Tim [name] MRN: [id number] DOB/AGE: October 24, 1956 63 y.o.  Admit date: 08/06/2019 Discharge date: 08/07/2019  Primary Diagnosis: Osteoarthritis, right knee   Admission Diagnoses:  Past Medical History:  Diagnosis Date   Arthritis    Hypertension    Discharge Diagnoses:   Principal Problem:   OA (osteoarthritis) of knee Active Problems:   Osteoarthritis of right knee  Estimated body mass index is 33.8 kg/m as calculated from the following:   Height as of this encounter: 6\' 3"  (1.905 m).   Weight as of this encounter: 122.7 kg.  Procedure:  Procedure(s) (LRB): TOTAL KNEE ARTHROPLASTY (Right)   Consults: None  HPI: Tim [name] is a 63 y.o. year old male with end stage OA of his right knee with progressively worsening pain and dysfunction. He has constant pain, with activity and at rest and significant functional deficits with difficulties even with ADLs. He has had extensive non-op management including analgesics, injections of cortisone and viscosupplements, and home exercise program, but remains in significant pain with significant dysfunction. Radiographs show bone on bone arthritis medial and patellofemoral. He presents now for right Total Knee Arthroplasty.    Laboratory Data: Admission on 08/06/2019, Discharged on 08/07/2019  Component Date Value Ref Range Status   Glucose-Capillary 08/06/2019 113* 70 - 99 mg/dL Final   Comment 1 40/98/119108/17/2020 Notify RN   Final   Comment 2 08/06/2019 Document in Chart   Final   Glucose-Capillary 08/06/2019 101* 70 - 99 mg/dL Final   WBC 47/82/956208/18/2020 8.7  4.0 - 10.5 K/uL Final   RBC 08/07/2019 4.14* 4.22 - 5.81 MIL/uL Final   Hemoglobin 08/07/2019 10.4* 13.0 - 17.0 g/dL Final   HCT 13/08/657808/18/2020 33.5* 39.0 - 52.0 % Final   MCV 08/07/2019 80.9  80.0 - 100.0 fL Final   MCH 08/07/2019 25.1* 26.0 - 34.0 pg Final   MCHC 08/07/2019 31.0  30.0 - 36.0 g/dL Final   RDW 46/96/295208/18/2020 16.9* 11.5 -  15.5 % Final   Platelets 08/07/2019 176  150 - 400 K/uL Final   nRBC 08/07/2019 0.0  0.0 - 0.2 % Final   Performed at [facility], [address] [place], [place] [address]   Sodium 08/07/2019 134* 135 - 145 mmol/L Final   Potassium 08/07/2019 4.8  3.5 - 5.1 mmol/L Final   Chloride 08/07/2019 103  98 - 111 mmol/L Final   CO2 08/07/2019 24  22 - 32 mmol/L Final   Glucose, Bld 08/07/2019 173* 70 - 99 mg/dL Final   BUN 44/01/027208/18/2020 17  8 - 23 mg/dL Final   Creatinine, Ser 08/07/2019 1.46* 0.61 - 1.24 mg/dL Final   Calcium 53/66/440308/18/2020 8.3* 8.9 - 10.3 mg/dL Final   GFR calc non Af Amer 08/07/2019 50* >60 mL/min Final   GFR calc Af Amer 08/07/2019 58* >60 mL/min Final   Anion gap 08/07/2019 7  5 - 15 Final   Performed at [facility], [address] [place], [place] [address]   Glucose-Capillary 08/06/2019 201* 70 - 99 mg/dL Final   Glucose-Capillary 08/07/2019 104* 70 - 99 mg/dL Final   Glucose-Capillary 08/07/2019 169* 70 - 99 mg/dL Final  Hospital Outpatient Visit on 08/02/2019  Component Date Value Ref Range Status   aPTT 08/02/2019 33  24 - 36 seconds Final   Performed at [facility] [place] [facility], [address] [place], [place] [address]   WBC 08/02/2019 5.7  4.0 - 10.5 K/uL Final   RBC 08/02/2019 4.82  4.22 - 5.81 MIL/uL Final   Hemoglobin 08/02/2019 12.4* 13.0 - 17.0 g/dL Final   HCT 60/45/4098 39.2  39.0 - 52.0 % Final   MCV 08/02/2019 81.3  80.0 - 100.0 fL Final   MCH 08/02/2019 25.7* 26.0 - 34.0 pg Final   MCHC 08/02/2019 31.6  30.0 - 36.0 g/dL Final   RDW 11/91/4782 17.0* 11.5 - 15.5 % Final   Platelets 08/02/2019 195  150 - 400 K/uL Final   nRBC 08/02/2019 0.0  0.0 - 0.2 % Final   Performed at [facility], [address], [place], [place] [address]   Sodium 08/02/2019 137  135 - 145 mmol/L Final   Potassium 08/02/2019 4.3  3.5 - 5.1 mmol/L Final   Chloride 08/02/2019 101  98 -  111 mmol/L Final   CO2 08/02/2019 27  22 - 32 mmol/L Final   Glucose, Bld 08/02/2019 97  70 - 99 mg/dL Final   BUN 30/86/5784 18  8 - 23 mg/dL Final   Creatinine, Ser 08/02/2019 1.59* 0.61 - 1.24 mg/dL Final   Calcium 69/62/9528 9.3  8.9 - 10.3 mg/dL Final   Total Protein 41/32/4401 7.8  6.5 - 8.1 g/dL Final   Albumin 02/72/5366 4.3  3.5 - 5.0 g/dL Final   AST 44/02/4741 36  15 - 41 U/L Final   ALT 08/02/2019 30  0 - 44 U/L Final   Alkaline Phosphatase 08/02/2019 64  38 - 126 U/L Final   Total Bilirubin 08/02/2019 0.7  0.3 - 1.2 mg/dL Final   GFR calc non Af Amer 08/02/2019 46* >60 mL/min Final   GFR calc Af Amer 08/02/2019 53* >60 mL/min Final   Anion gap 08/02/2019 9  5 - 15 Final   Performed at [facility], [address] [place], [place] [address]   Prothrombin Time 08/02/2019 13.1  11.4 - 15.2 seconds Final   INR 08/02/2019 1.0  0.8 - 1.2 Final   Comment: (NOTE) INR goal varies based on device and disease states. Performed at [facility], [address] [place], [place] [address]    ABO/RH(D) 08/02/2019 A POS   Final   Antibody Screen 08/02/2019 NEG   Final   Sample Expiration 08/02/2019 08/09/2019,2359   Final   Extend sample reason 08/02/2019    Final                   Value:NO TRANSFUSIONS OR PREGNANCY IN THE PAST 3 MONTHS Performed at [facility], [address], [place], [place] [address]    MRSA, PCR 08/02/2019 NEGATIVE  NEGATIVE Final   Staphylococcus aureus 08/02/2019 NEGATIVE  NEGATIVE Final   Comment: (NOTE) The Xpert SA Assay (FDA approved for NASAL specimens in patients 47 years of age and older), is one component of a comprehensive surveillance program. It is not intended to diagnose infection nor to guide or monitor treatment. Performed at [facility], [address], [place], [place] [address]    Hgb A1c MFr Bld 08/02/2019 6.3* 4.8 - 5.6 % Final    Comment: (NOTE) Pre diabetes:          5.7%-6.4% Diabetes:              >6.4% Glycemic control for   <7.0% adults with diabetes    Mean Plasma Glucose 08/02/2019 134.11  mg/dL Final   Performed at [facility], [address], [place], [place] [address]   Glucose-Capillary 08/02/2019 107* 70 -  99 mg/dL Final  Hospital Outpatient Visit on 08/02/2019  Component Date Value Ref Range Status   SARS Coronavirus 2 08/02/2019 NEGATIVE  NEGATIVE Final   Comment: (NOTE) SARS-CoV-2 target nucleic acids are NOT DETECTED. The SARS-CoV-2 RNA is generally detectable in upper and lower respiratory specimens during the acute phase of infection. Negative results do not preclude SARS-CoV-2 infection, do not rule out co-infections with other pathogens, and should not be used as the sole basis for treatment or other patient management decisions. Negative results must be combined with clinical observations, patient history, and epidemiological information. The expected result is Negative. Fact Sheet for Patients: SugarRoll.be Fact Sheet for Healthcare Providers: https://www.woods-mathews.com/ This test is not yet approved or cleared by the [place] FDA and  has been authorized for detection and/or diagnosis of SARS-CoV-2 by FDA under an Emergency Use Authorization (EUA). This EUA will remain  in effect (meaning this test can be used) for the duration of the COVID-19 declaration under Section 56                          4(b)(1) of the Act, 21 U.S.C. section 360bbb-3(b)(1), unless the authorization is terminated or revoked sooner. Performed at [facility], [place] [address] [place], [place] [address]      X-Rays:No results found.  EKG: Orders placed or performed during the hospital encounter of 08/02/19   EKG 12 lead   EKG 12 lead     Hospital Course: Tim [name] is a 63 y.o. who was admitted to [facility]. They were  brought to the operating room on 08/06/2019 and underwent Procedure(s): TOTAL KNEE ARTHROPLASTY.  Patient tolerated the procedure well and was later transferred to the recovery room and then to the orthopaedic floor for postoperative care. They were given PO and IV analgesics for pain control following their surgery. They were given 24 hours of postoperative antibiotics of  Anti-infectives (From admission, onward)   Start     Dose/Rate Route Frequency Ordered Stop   08/06/19 1800  ceFAZolin (ANCEF) IVPB 2g/100 mL premix     2 g 200 mL/hr over 30 Minutes Intravenous Every 6 hours 08/06/19 1723 08/07/19 0010   08/06/19 0600  ceFAZolin (ANCEF) 3 g in dextrose 5 % 50 mL IVPB     3 g 100 mL/hr over 30 Minutes Intravenous On call to O.R. 08/05/19 1024 08/06/19 1211     and started on DVT prophylaxis in the form of Aspirin.   PT and OT were ordered for total joint protocol. Discharge planning consulted to help with postop disposition and equipment needs.  Patient had a good night on the evening of surgery. They started to get up OOB with therapy on POD #0. Pt was seen during rounds and was ready to go home pending progress with therapy. Hemovac drain was pulled without difficulty. He worked with therapy on POD #1 and was meeting his goals. Pt was discharged to home later that day in stable condition.  Diet: Diabetic diet Activity: WBAT Follow-up: in 2 weeks Disposition: Home with outpatient physical therapy at [facility]) Discharged Condition: stable   Discharge Instructions    Call MD / Call 911   Complete by: As directed    If you experience chest pain or shortness of breath, CALL 911 and be transported to the hospital emergency room.  If you develope a fever above 101 F, pus (white drainage) or increased drainage or redness at  the wound, or calf pain, call your surgeon's office.   Change dressing   Complete by: As directed    Change dressing on Wednesday, then change the dressing daily  with sterile 4 x 4 inch gauze dressing and apply TED hose.   Constipation Prevention   Complete by: As directed    Drink plenty of fluids.  Prune juice may be helpful.  You may use a stool softener, such as Colace (over the counter) 100 mg twice a day.  Use MiraLax (over the counter) for constipation as needed.   Diet - low sodium heart healthy   Complete by: As directed    Discharge instructions   Complete by: As directed    Dr. Ollen Gross Total Joint Specialist [facility] [address], [address] [place], [place] [address] [phone]  TOTAL KNEE REPLACEMENT POSTOPERATIVE DIRECTIONS  Knee Rehabilitation, Guidelines Following Surgery  Results after knee surgery are often greatly improved when you follow the exercise, range of motion and muscle strengthening exercises prescribed by your doctor. Safety measures are also important to protect the knee from further injury. Any time any of these exercises cause you to have increased pain or swelling in your knee joint, decrease the amount until you are comfortable again and slowly increase them. If you have problems or questions, call your caregiver or physical therapist for advice.   HOME CARE INSTRUCTIONS  Remove items at home which could result in a fall. This includes throw rugs or furniture in walking pathways.  ICE to the affected knee every three hours for 30 minutes at a time and then as needed for pain and swelling.  Continue to use ice on the knee for pain and swelling from surgery. You may notice swelling that will progress down to the foot and ankle.  This is normal after surgery.  Elevate the leg when you are not up walking on it.   Continue to use the breathing machine which will help keep your temperature down.  It is common for your temperature to cycle up and down following surgery, especially at night when you are not up moving around and exerting yourself.  The breathing machine keeps your lungs expanded and your  temperature down. Do not place pillow under knee, focus on keeping the knee straight while resting   DIET You may resume your previous home diet once your are discharged from the hospital.  DRESSING / WOUND CARE / SHOWERING You may change your dressing 3-5 days after surgery.  Then change the dressing every day with sterile gauze.  Please use good hand washing techniques before changing the dressing.  Do not use any lotions or creams on the incision until instructed by your surgeon. You may start showering once you are discharged home but do not submerge the incision under water. Just pat the incision dry and apply a dry gauze dressing on daily. Change the surgical dressing daily and reapply a dry dressing each time.  ACTIVITY Walk with your walker as instructed. Use walker as long as suggested by your caregivers. Avoid periods of inactivity such as sitting longer than an hour when not asleep. This helps prevent blood clots.  You may resume a sexual relationship in one month or when given the OK by your doctor.  You may return to work once you are cleared by your doctor.  Do not drive a car for 6 weeks or until released by you surgeon.  Do not drive while taking narcotics.  WEIGHT BEARING  Weight bearing as tolerated with assist device (walker, cane, etc) as directed, use it as long as suggested by your surgeon or therapist, typically at least 4-6 weeks.  POSTOPERATIVE CONSTIPATION PROTOCOL Constipation - defined medically as fewer than three stools per week and severe constipation as less than one stool per week.  One of the most common issues patients have following surgery is constipation.  Even if you have a regular bowel pattern at home, your normal regimen is likely to be disrupted due to multiple reasons following surgery.  Combination of anesthesia, postoperative narcotics, change in appetite and fluid intake all can affect your bowels.  In order to avoid complications following  surgery, here are some recommendations in order to help you during your recovery period.  Colace (docusate) - Pick up an over-the-counter form of Colace or another stool softener and take twice a day as long as you are requiring postoperative pain medications.  Take with a full glass of water daily.  If you experience loose stools or diarrhea, hold the colace until you stool forms back up.  If your symptoms do not get better within 1 week or if they get worse, check with your doctor.  Dulcolax (bisacodyl) - Pick up over-the-counter and take as directed by the product packaging as needed to assist with the movement of your bowels.  Take with a full glass of water.  Use this product as needed if not relieved by Colace only.   MiraLax (polyethylene glycol) - Pick up over-the-counter to have on hand.  MiraLax is a solution that will increase the amount of water in your bowels to assist with bowel movements.  Take as directed and can mix with a glass of water, juice, soda, coffee, or tea.  Take if you go more than two days without a movement. Do not use MiraLax more than once per day. Call your doctor if you are still constipated or irregular after using this medication for 7 days in a row.  If you continue to have problems with postoperative constipation, please contact the office for further assistance and recommendations.  If you experience "the worst abdominal pain ever" or develop nausea or vomiting, please contact the office immediatly for further recommendations for treatment.  ITCHING  If you experience itching with your medications, try taking only a single pain pill, or even half a pain pill at a time.  You can also use Benadryl over the counter for itching or also to help with sleep.   TED HOSE STOCKINGS Wear the elastic stockings on both legs for three weeks following surgery during the day but you may remove then at night for sleeping.  MEDICATIONS See your medication summary on the "After  Visit Summary" that the nursing staff will review with you prior to discharge.  You may have some home medications which will be placed on hold until you complete the course of blood thinner medication.  It is important for you to complete the blood thinner medication as prescribed by your surgeon.  Continue your approved medications as instructed at time of discharge.  PRECAUTIONS If you experience chest pain or shortness of breath - call 911 immediately for transfer to the hospital emergency department.  If you develop a fever greater that 101 F, purulent drainage from wound, increased redness or drainage from wound, foul odor from the wound/dressing, or calf pain - CONTACT YOUR SURGEON.  FOLLOW-UP APPOINTMENTS Make sure you keep all of your appointments after your operation with your surgeon and caregivers. You should call the office at the above phone number and make an appointment for approximately two weeks after the date of your surgery or on the date instructed by your surgeon outlined in the "After Visit Summary".   RANGE OF MOTION AND STRENGTHENING EXERCISES  Rehabilitation of the knee is important following a knee injury or an operation. After just a few days of immobilization, the muscles of the thigh which control the knee become weakened and shrink (atrophy). Knee exercises are designed to build up the tone and strength of the thigh muscles and to improve knee motion. Often times heat used for twenty to thirty minutes before working out will loosen up your tissues and help with improving the range of motion but do not use heat for the first two weeks following surgery. These exercises can be done on a training (exercise) mat, on the floor, on a table or on a bed. Use what ever works the best and is most comfortable for you Knee exercises include:  Leg Lifts - While your knee is still immobilized in a splint or cast, you can do straight leg  raises. Lift the leg to 60 degrees, hold for 3 sec, and slowly lower the leg. Repeat 10-20 times 2-3 times daily. Perform this exercise against resistance later as your knee gets better.  Quad and Hamstring Sets - Tighten up the muscle on the front of the thigh (Quad) and hold for 5-10 sec. Repeat this 10-20 times hourly. Hamstring sets are done by pushing the foot backward against an object and holding for 5-10 sec. Repeat as with quad sets.  Leg Slides: Lying on your back, slowly slide your foot toward your buttocks, bending your knee up off the floor (only go as far as is comfortable). Then slowly slide your foot back down until your leg is flat on the floor again. Angel Wings: Lying on your back spread your legs to the side as far apart as you can without causing discomfort.  A rehabilitation program following serious knee injuries can speed recovery and prevent re-injury in the future due to weakened muscles. Contact your doctor or a physical therapist for more information on knee rehabilitation.   IF YOU ARE TRANSFERRED TO A SKILLED REHAB FACILITY If the patient is transferred to a skilled rehab facility following release from the hospital, a list of the current medications will be sent to the facility for the patient to continue.  When discharged from the skilled rehab facility, please have the facility set up the patient's Home Health Physical Therapy prior to being released. Also, the skilled facility will be responsible for providing the patient with their medications at time of release from the facility to include their pain medication, the muscle relaxants, and their blood thinner medication. If the patient is still at the rehab facility at time of the two week follow up appointment, the skilled rehab facility will also need to assist the patient in arranging follow up appointment in our office and any transportation needs.  MAKE SURE YOU:  Understand these instructions.  Get help right away  if you are not doing well or get worse.    Pick up stool softner and laxative for home use following surgery while on pain medications. Do not submerge incision under water. Please use good hand washing techniques while changing dressing each day. May shower starting three days after surgery.  Please use a clean towel to pat the incision dry following showers. Continue to use ice for pain and swelling after surgery. Do not use any lotions or creams on the incision until instructed by your surgeon.   Do not put a pillow under the knee. Place it under the heel.   Complete by: As directed    Driving restrictions   Complete by: As directed    No driving for two weeks   TED hose   Complete by: As directed    Use stockings (TED hose) for three weeks on both leg(s).  You may remove them at night for sleeping.   Weight bearing as tolerated   Complete by: As directed      Allergies as of 08/07/2019      Reactions   Ace Inhibitors Swelling   Swelling of lips and mouth   Brimonidine    Red and burn      Medication List    TAKE these medications   aspirin 325 MG EC tablet Take 1 tablet (325 mg total) by mouth 2 (two) times daily for 20 days. Then resume one 81 mg aspirin once a day. What changed:   medication strength  how much to take  when to take this  additional instructions Notes to patient: Blood clot prevention   atorvastatin 20 MG tablet Commonly known as: LIPITOR Take 20 mg by mouth daily.   gabapentin 300 MG capsule Commonly known as: NEURONTIN Take 1 capsule (300 mg total) by mouth 3 (three) times daily. Take a 300 mg capsule three times a day for two weeks following surgery.Then take a 300 mg capsule two times a day for two weeks. Then take a 300 mg capsule once a day for two weeks. Then discontinue.   ketoconazole 2 % cream Commonly known as: NIZORAL Apply 1 application topically daily as needed for irritation.   latanoprost 0.005 % ophthalmic  solution Commonly known as: XALATAN Place 1 drop into both eyes at bedtime.   levothyroxine 75 MCG tablet Commonly known as: SYNTHROID Take 75 mcg by mouth daily before breakfast.   metFORMIN 500 MG 24 hr tablet Commonly known as: GLUCOPHAGE-XR Take 1,000 mg by mouth daily with breakfast.   methocarbamol 500 MG tablet Commonly known as: ROBAXIN Take 1 tablet (500 mg total) by mouth every 6 (six) hours as needed for muscle spasms. Notes to patient: Muscle relaxer   multivitamin with minerals Tabs tablet Take 1 tablet by mouth daily.   oxyCODONE 5 MG immediate release tablet Commonly known as: Oxy IR/ROXICODONE Take 1-2 tablets (5-10 mg total) by mouth every 6 (six) hours as needed for severe pain. What changed:   how much to take  when to take this   sitaGLIPtin 100 MG tablet Commonly known as: JANUVIA Take 100 mg by mouth daily.   traMADol 50 MG tablet Commonly known as: ULTRAM Take 1-2 tablets (50-100 mg total) by mouth every 6 (six) hours as needed for moderate pain. What changed: reasons to take this   valsartan-hydrochlorothiazide 160-12.5 MG tablet Commonly known as: DIOVAN-HCT Take 1 tablet by mouth daily.            Discharge Care Instructions  (From admission, onward)         Start     Ordered   08/07/19 0000  Weight bearing as tolerated     08/07/19 0757   08/07/19 0000  Change dressing    Comments: Change dressing on Wednesday, then change the dressing  daily with sterile 4 x 4 inch gauze dressing and apply TED hose.   08/07/19 0757         Follow-up Information    Ollen Gross, MD. Schedule an appointment as soon as possible for a visit on 08/21/2019.   Specialty: Orthopedic Surgery Contact information: [address] [place] [address] [phone]           Signed: Arther Abbott, PA-C Orthopedic Surgery 08/08/2019, 9:12 AM

## 2019-08-10 ENCOUNTER — Ambulatory Visit: Payer: Commercial Managed Care - PPO | Attending: Student | Admitting: Physical Therapy

## 2019-08-10 ENCOUNTER — Encounter: Payer: Self-pay | Admitting: Physical Therapy

## 2019-08-10 ENCOUNTER — Other Ambulatory Visit: Payer: Self-pay

## 2019-08-10 DIAGNOSIS — R262 Difficulty in walking, not elsewhere classified: Secondary | ICD-10-CM | POA: Diagnosis present

## 2019-08-10 DIAGNOSIS — M25661 Stiffness of right knee, not elsewhere classified: Secondary | ICD-10-CM | POA: Insufficient documentation

## 2019-08-10 DIAGNOSIS — M25561 Pain in right knee: Secondary | ICD-10-CM | POA: Insufficient documentation

## 2019-08-10 DIAGNOSIS — R600 Localized edema: Secondary | ICD-10-CM

## 2019-08-10 NOTE — Patient Instructions (Signed)
Access Code: [email or web address]  URL: [email or web address]  Date: 08/10/2019  Prepared by: Lum Babe   Exercises  Supine Knee Extension Strengthening - 10 reps - 2 sets - 3 hold - 3x daily - 7x weekly  Seated Knee Extension Stretch with Chair - 3 sets - 180 hold - 3x daily - 7x weekly  Seated Knee Flexion Stretch - 5 reps - 2 sets - 30 hold - 3x daily - 7x weekly

## 2019-08-10 NOTE — Therapy (Signed)
[place], [place], [address] Phone: [phone]   Fax:  [phone]  Physical Therapy Evaluation  Patient Details  Name: Tim [name] MRN: [id number] Date of Birth: November 03, 1956 Referring Provider (PT): Aluisio   Encounter Date: 08/10/2019  PT End of Session - 08/10/19 0954    Visit Number  1    Number of Visits  60    Date for PT Re-Evaluation  10/10/19    PT Start Time  0937    PT Stop Time  1022    PT Time Calculation (min)  45 min    Activity Tolerance  Patient tolerated treatment well;Patient limited by pain    Behavior During Therapy  [facility] for tasks assessed/performed       Past Medical History:  Diagnosis Date  . Arthritis   . Hypertension     Past Surgical History:  Procedure Laterality Date  . KNEE CLOSED REDUCTION Left 10/13/2015   Procedure: LEFT CLOSED MANIPULATION KNEE;  Surgeon: Gaynelle Arabian, MD;  [facility]: [facility];  Service: Orthopedics;  Laterality: Left;  . TOTAL KNEE ARTHROPLASTY Left 07/28/2015   Procedure: LEFT TOTAL KNEE ARTHROPLASTY;  Surgeon: Gaynelle Arabian, MD;  [facility];  Service: Orthopedics;  Laterality: Left;  . TOTAL KNEE ARTHROPLASTY Right 08/06/2019   Procedure: TOTAL KNEE ARTHROPLASTY;  Surgeon: Gaynelle Arabian, MD;  [facility];  Service: Orthopedics;  Laterality: Right;  29min    There were no vitals filed for this visit.   Subjective Assessment - 08/10/19 0940    Subjective  Patient reports that he underwent a right TKR on 08/06/19.  He reports that he had problems with the knee for a number of years, had left knee 4 years ago and required a manipulation.    Pertinent History  left TKR 2016    Limitations  Standing;Lifting;Walking;House hold activities    Patient Stated Goals  have good motion, less pain and walk better    Currently in Pain?  Yes    Pain Score  2     Pain Location  Knee    Pain Orientation  Right    Pain Descriptors /  Indicators  Aching    Pain Type  Acute pain;Surgical pain    Pain Onset  In the past 7 days    Pain Frequency  Constant    Aggravating Factors   without pain meds and with trying to bend the knee pain up to 9/10    Pain Relieving Factors  rest and pain meds pain a 2/10    Effect of Pain on Daily Activities  limits everything         [facility] PT Assessment - 08/10/19 0001      Assessment   Medical Diagnosis  s/p right RKR    Referring Provider (PT)  Aluisio    Onset Date/Surgical Date  08/06/19    Prior Therapy  no      Precautions   Precautions  None      Balance Screen   Has the patient fallen in the past 6 months  No    Has the patient had a decrease in activity level because of a fear of falling?   No    Is the patient reluctant to leave their home because of a fear of falling?   No      Home Environment   Additional Comments  2 steps into the home, does his own yardwork  Prior Function   Level of Independence  Independent    Vocation  Full time employment    IT sales professionalVocation Requirements  aircraft mechanic, some lifting, ladders    Leisure  no exercise      Observation/Other Assessments-Edema    Edema  Circumferential      Circumferential Edema   Circumferential - Right  52 cm    Circumferential - Left   45 cm      ROM / Strength   AROM / PROM / Strength  AROM;PROM;Strength      AROM   AROM Assessment Site  Knee    Right/Left Knee  Right    Right Knee Extension  30    Right Knee Flexion  70      PROM   PROM Assessment Site  Knee    Right/Left Knee  Right    Right Knee Extension  15    Right Knee Flexion  76      Strength   Overall Strength Comments  3+/5 with pain      Palpation   Palpation comment  swollen, warm, tender, bandage in place      Ambulation/Gait   Gait Comments  uses a FWW, slow, stiff legged                Objective measurements completed on examination: See above findings.      [facility] Adult PT Treatment/Exercise - 08/10/19  0001      Exercises   Exercises  Knee/Hip      Knee/Hip Exercises: Aerobic   Nustep  level 4 x 6 minutes      Modalities   Modalities  Vasopneumatic      Vasopneumatic   Number Minutes Vasopneumatic   15 minutes    Vasopnuematic Location   Knee    Vasopneumatic Pressure  Medium    Vasopneumatic Temperature   36             PT Education - 08/10/19 0953    Education Details  low load long duration stretches for flexion and extension    Person(s) Educated  Patient    Methods  Explanation;Demonstration;Handout;Verbal cues    Comprehension  Verbalized understanding       PT Short Term Goals - 08/10/19 0957      PT SHORT TERM GOAL #1   Title  independent with initial HEP    Time  2    Period  Weeks    Status  New        PT Long Term Goals - 08/10/19 0957      PT LONG TERM GOAL #1   Title  increase AROM of the right knee to 5-115 degrees flexion    Time  8    Period  Weeks      PT LONG TERM GOAL #2   Title  decrease pain 50%    Time  8    Period  Weeks    Status  New      PT LONG TERM GOAL #3   Title  walk community distances without assistive device and minimal deviation    Time  8    Period  Weeks    Status  New      PT LONG TERM GOAL #4   Title  go up and down stairs reciprocally    Time  8    Period  Weeks    Status  New  Plan - 08/10/19 0954    Clinical Impression Statement  Patient underwent a right TKR on 08/06/19.  He is very stiff, AROM 30-70 degrees flexion, PROM 17-76 degrees flexion.  Gait with FWW, slow and stiff legged, has swelling and bandage on the knee    Stability/Clinical Decision Making  Evolving/Moderate complexity    Clinical Decision Making  Low    Rehab Potential  Good    PT Frequency  3x / week    PT Duration  8 weeks    PT Treatment/Interventions  ADLs/Self Care Home Management;Cryotherapy;Electrical Stimulation;Therapeutic activities;Functional mobility training;Stair training;Gait training;Therapeutic  exercise;Balance training;Neuromuscular re-education;Patient/family education;Manual techniques;Vasopneumatic Device    PT Next Visit Plan  slowly get working on the ROM and gait    Consulted and Agree with Plan of Care  Patient       Patient will benefit from skilled therapeutic intervention in order to improve the following deficits and impairments:  Abnormal gait, Pain, Impaired tone, Decreased scar mobility, Decreased mobility, Cardiopulmonary status limiting activity, Decreased activity tolerance, Decreased endurance, Decreased range of motion, Decreased strength, Impaired flexibility, Increased edema, Difficulty walking, Decreased balance  Visit Diagnosis: Acute pain of right knee - Plan: PT plan of care cert/re-cert  Stiffness of right knee, not elsewhere classified - Plan: PT plan of care cert/re-cert  Difficulty in walking, not elsewhere classified - Plan: PT plan of care cert/re-cert  Localized edema - Plan: PT plan of care cert/re-cert     Problem List Patient Active Problem List   Diagnosis Date Noted  . Osteoarthritis of right knee 08/06/2019  . Postoperative stiffness of total knee replacement ([facility]) 10/12/2015  . OA (osteoarthritis) of knee 07/28/2015    Jearld LeschALBRIGHT,Teo Moede W., PT 08/10/2019, 10:07 AM  [facility] [address] [place], [place], [address] Phone: [phone]   Fax:  [phone]  Name: Kathleene Hazellonzo Cozby MRN: [id number] Date of Birth: May 12, 1956

## 2019-08-13 ENCOUNTER — Other Ambulatory Visit: Payer: Self-pay

## 2019-08-13 ENCOUNTER — Encounter: Payer: Self-pay | Admitting: Physical Therapy

## 2019-08-13 ENCOUNTER — Ambulatory Visit: Payer: Commercial Managed Care - PPO | Admitting: Physical Therapy

## 2019-08-13 DIAGNOSIS — R262 Difficulty in walking, not elsewhere classified: Secondary | ICD-10-CM

## 2019-08-13 DIAGNOSIS — M25561 Pain in right knee: Secondary | ICD-10-CM

## 2019-08-13 DIAGNOSIS — R600 Localized edema: Secondary | ICD-10-CM

## 2019-08-13 DIAGNOSIS — M25661 Stiffness of right knee, not elsewhere classified: Secondary | ICD-10-CM

## 2019-08-13 NOTE — Therapy (Signed)
[facility] [address] [place], [place], [address] Phone: [phone]   Fax:  [phone]  Physical Therapy Treatment  Patient Details  Name: Tim [name] MRN: [id number] Date of Birth: 11/22/1956 Referring Provider (PT): Aluisio   Encounter Date: 08/13/2019  PT End of Session - 08/13/19 1012    Visit Number  2    Number of Visits  60    Date for PT Re-Evaluation  10/10/19    PT Start Time  0928    PT Stop Time  1026    PT Time Calculation (min)  58 min       Past Medical History:  Diagnosis Date  . Arthritis   . Hypertension     Past Surgical History:  Procedure Laterality Date  . KNEE CLOSED REDUCTION Left 10/13/2015   Procedure: LEFT CLOSED MANIPULATION KNEE;  Surgeon: Ollen GrossFrank Aluisio, MD;  [facility];  Service: Orthopedics;  Laterality: Left;  . TOTAL KNEE ARTHROPLASTY Left 07/28/2015   Procedure: LEFT TOTAL KNEE ARTHROPLASTY;  Surgeon: Ollen GrossFrank Aluisio, MD;  [facility];  Service: Orthopedics;  Laterality: Left;  . TOTAL KNEE ARTHROPLASTY Right 08/06/2019   Procedure: TOTAL KNEE ARTHROPLASTY;  Surgeon: Ollen GrossAluisio, Frank, MD;  [facility];  Service: Orthopedics;  Laterality: Right;  50min    There were no vitals filed for this visit.  Subjective Assessment - 08/13/19 0929    Subjective  "Everything is good"    Limitations  Standing;Lifting;Walking;House hold activities    Patient Stated Goals  have good motion, less pain and walk better    Currently in Pain?  No/denies    Pain Score  0-No pain                       [facility] Adult PT Treatment/Exercise - 08/13/19 0001      Knee/Hip Exercises: Aerobic   Nustep  level 4 x 6 minutes      Knee/Hip Exercises: Machines for Strengthening   Cybex Leg Press  2 40# 2x10; 20# RLE TKE x10   tactile and verbal cues to keep toes down     Knee/Hip Exercises: Seated   Long Arc Quad  AROM;Strengthening;Right;10 reps;2 sets    Hamstring Curl   AROM;Strengthening;Right;2 sets;10 reps   R Tband     Modalities   Modalities  Vasopneumatic      Vasopneumatic   Number Minutes Vasopneumatic   15 minutes    Vasopnuematic Location   Knee    Vasopneumatic Pressure  Medium    Vasopneumatic Temperature   36      Manual Therapy   Manual Therapy  Joint mobilization;Passive ROM    Manual therapy comments  seated stretching of quads and HS    Joint Mobilization  Anterior posterior 1-2    Passive ROM  R knee flex and ext               PT Short Term Goals - 08/10/19 0957      PT SHORT TERM GOAL #1   Title  independent with initial HEP    Time  2    Period  Weeks    Status  New        PT Long Term Goals - 08/10/19 0957      PT LONG TERM GOAL #1   Title  increase AROM of the right knee to 5-115 degrees flexion    Time  8    Period  Weeks      PT LONG TERM GOAL #2   Title  decrease pain 50%    Time  8    Period  Weeks    Status  New      PT LONG TERM GOAL #3   Title  walk community distances without assistive device and minimal deviation    Time  8    Period  Weeks    Status  New      PT LONG TERM GOAL #4   Title  go up and down stairs reciprocally    Time  8    Period  Weeks    Status  New            Plan - 08/13/19 1012    Clinical Impression Statement  patient had pain in Rt knee throughout exercises however was able to work through the pain. Patient is limited by lack of ROM in Rt knee, muscle weakness, and swelling around the knee.    Stability/Clinical Decision Making  Evolving/Moderate complexity    Rehab Potential  Good    PT Frequency  3x / week    PT Duration  8 weeks    PT Treatment/Interventions  ADLs/Self Care Home Management;Cryotherapy;Electrical Stimulation;Therapeutic activities;Functional mobility training;Stair training;Gait training;Therapeutic exercise;Balance training;Neuromuscular re-education;Patient/family education;Manual techniques;Vasopneumatic Device    PT Next Visit  Plan  slowly get working on the ROM and gait       Patient will benefit from skilled therapeutic intervention in order to improve the following deficits and impairments:  Abnormal gait, Pain, Impaired tone, Decreased scar mobility, Decreased mobility, Cardiopulmonary status limiting activity, Decreased activity tolerance, Decreased endurance, Decreased range of motion, Decreased strength, Impaired flexibility, Increased edema, Difficulty walking, Decreased balance  Visit Diagnosis: Acute pain of right knee  Stiffness of right knee, not elsewhere classified  Difficulty in walking, not elsewhere classified  Localized edema     Problem List Patient Active Problem List   Diagnosis Date Noted  . Osteoarthritis of right knee 08/06/2019  . Postoperative stiffness of total knee replacement ([place]) 10/12/2015  . OA (osteoarthritis) of knee 07/28/2015    Scot Jun, PTA 08/13/2019, 10:16 AM  [place], [place], [address] Phone: [phone]   Fax:  [phone]  Name: Tim [name] MRN: [id number] Date of Birth: 04-May-1956

## 2019-08-15 ENCOUNTER — Encounter: Payer: Self-pay | Admitting: Physical Therapy

## 2019-08-15 ENCOUNTER — Other Ambulatory Visit: Payer: Self-pay

## 2019-08-15 ENCOUNTER — Ambulatory Visit: Payer: Commercial Managed Care - PPO | Admitting: Physical Therapy

## 2019-08-15 DIAGNOSIS — M25561 Pain in right knee: Secondary | ICD-10-CM | POA: Diagnosis not present

## 2019-08-15 DIAGNOSIS — M25661 Stiffness of right knee, not elsewhere classified: Secondary | ICD-10-CM

## 2019-08-15 DIAGNOSIS — R600 Localized edema: Secondary | ICD-10-CM

## 2019-08-15 DIAGNOSIS — R262 Difficulty in walking, not elsewhere classified: Secondary | ICD-10-CM

## 2019-08-15 NOTE — Therapy (Signed)
[place], [place], [address] Phone: [phone]   Fax:  [phone]  Physical Therapy Treatment  Patient Details  Name: Tim [name] MRN: [id number] Date of Birth: 1956/10/30 Referring Provider (PT): Aluisio   Encounter Date: 08/15/2019  PT End of Session - 08/15/19 1028    Visit Number  3    Number of Visits  60    Date for PT Re-Evaluation  10/10/19    PT Start Time  0928    PT Stop Time  1028    PT Time Calculation (min)  60 min    Activity Tolerance  Patient tolerated treatment well    Behavior During Therapy  [facility] for tasks assessed/performed       Past Medical History:  Diagnosis Date  . Arthritis   . Hypertension     Past Surgical History:  Procedure Laterality Date  . KNEE CLOSED REDUCTION Left 10/13/2015   Procedure: LEFT CLOSED MANIPULATION KNEE;  Surgeon: Gaynelle Arabian, MD;  [facility];  Service: Orthopedics;  Laterality: Left;  . TOTAL KNEE ARTHROPLASTY Left 07/28/2015   Procedure: LEFT TOTAL KNEE ARTHROPLASTY;  Surgeon: Gaynelle Arabian, MD;  [facility];  Service: Orthopedics;  Laterality: Left;  . TOTAL KNEE ARTHROPLASTY Right 08/06/2019   Procedure: TOTAL KNEE ARTHROPLASTY;  Surgeon: Gaynelle Arabian, MD;  [facility];  Service: Orthopedics;  Laterality: Right;  40mn    There were no vitals filed for this visit.  Subjective Assessment - 08/15/19 0931    Subjective  "Im good"    Patient Stated Goals  have good motion, less pain and walk better    Currently in Pain?  Yes    Pain Score  2     Pain Location  Knee    Pain Orientation  Right                       [facility] Adult PT Treatment/Exercise - 08/15/19 0001      Knee/Hip Exercises: Aerobic   Nustep  level 4 x 6 minutes      Knee/Hip Exercises: Machines for Strengthening   Cybex Leg Press  40# 2x10; 20# RLE TKE x10      Knee/Hip Exercises: Standing   Other Standing Knee Exercises  TKE  RLE ball on wall 2x10       Knee/Hip Exercises: Seated   Long Arc Quad  AROM;Strengthening;Right;10 reps;2 sets    Hamstring Curl  Strengthening;Right;2 sets;15 reps   R Tband     Knee/Hip Exercises: Supine   Short Arc Quad Sets  Right;AROM;Strengthening;2 sets;10 reps      Manual Therapy   Manual Therapy  Joint mobilization;Passive ROM    Manual therapy comments  seated stretching of quads and HS    Joint Mobilization  Anterior posterior 1-2    Passive ROM  R knee flex and ext               PT Short Term Goals - 08/15/19 1032      PT SHORT TERM GOAL #1   Title  independent with initial HEP    Status  Achieved        PT Long Term Goals - 08/15/19 1032      PT LONG TERM GOAL #1   Title  increase AROM of the right knee to 5-115 degrees flexion    Status  On-going      PT LONG TERM GOAL #  2   Title  decrease pain 50%    Status  Partially Met            Plan - 08/15/19 1029    Clinical Impression Statement  Pt is doing well considering his post op date. Some pain reported throughout session as expected with interventions promoting knee flexion. R knee is very swollen making it hard to palpate patella at times. He did well with all the exercises with his available ROM. Was able to get a quad contraction with SAQ    Stability/Clinical Decision Making  Evolving/Moderate complexity    Rehab Potential  Good    PT Frequency  3x / week    PT Duration  8 weeks    PT Treatment/Interventions  ADLs/Self Care Home Management;Cryotherapy;Electrical Stimulation;Therapeutic activities;Functional mobility training;Stair training;Gait training;Therapeutic exercise;Balance training;Neuromuscular re-education;Patient/family education;Manual techniques;Vasopneumatic Device    PT Next Visit Plan  slowly get working on the ROM and gait       Patient will benefit from skilled therapeutic intervention in order to improve the following deficits and impairments:  Abnormal gait, Pain,  Impaired tone, Decreased scar mobility, Decreased mobility, Cardiopulmonary status limiting activity, Decreased activity tolerance, Decreased endurance, Decreased range of motion, Decreased strength, Impaired flexibility, Increased edema, Difficulty walking, Decreased balance  Visit Diagnosis: Stiffness of right knee, not elsewhere classified  Difficulty in walking, not elsewhere classified  Localized edema  Acute pain of right knee     Problem List Patient Active Problem List   Diagnosis Date Noted  . Osteoarthritis of right knee 08/06/2019  . Postoperative stiffness of total knee replacement ([place]) 10/12/2015  . OA (osteoarthritis) of knee 07/28/2015    Scot Jun, PTA 08/15/2019, 10:32 AM  [place], [place], [address] Phone: [phone]   Fax:  [phone]  Name: Grayling Schranz MRN: [id number] Date of Birth: [birth year]

## 2019-08-17 ENCOUNTER — Ambulatory Visit: Payer: Commercial Managed Care - PPO | Admitting: Physical Therapy

## 2019-08-17 ENCOUNTER — Other Ambulatory Visit: Payer: Self-pay

## 2019-08-17 ENCOUNTER — Encounter: Payer: Self-pay | Admitting: Physical Therapy

## 2019-08-17 DIAGNOSIS — M25561 Pain in right knee: Secondary | ICD-10-CM

## 2019-08-17 DIAGNOSIS — M25661 Stiffness of right knee, not elsewhere classified: Secondary | ICD-10-CM

## 2019-08-17 DIAGNOSIS — R600 Localized edema: Secondary | ICD-10-CM

## 2019-08-17 NOTE — Therapy (Addendum)
[facility] [address] [place], [place], [address] Phone: [phone]   Fax:  [phone]  Physical Therapy Treatment  Patient Details  Name: Tim [name] MRN: [id number] Date of Birth: April 05, 1956 Referring Provider (PT): Aluisio   Encounter Date: 08/17/2019  PT End of Session - 08/17/19 1020    Visit Number  4    Number of Visits  60    Date for PT Re-Evaluation  10/10/19    PT Start Time  0930    PT Stop Time  1032    PT Time Calculation (min)  62 min    Activity Tolerance  Patient tolerated treatment well    Behavior During Therapy  [facility] for tasks assessed/performed       Past Medical History:  Diagnosis Date  . Arthritis   . Hypertension     Past Surgical History:  Procedure Laterality Date  . KNEE CLOSED REDUCTION Left 10/13/2015   Procedure: LEFT CLOSED MANIPULATION KNEE;  Surgeon: Ollen GrossFrank Aluisio, MD;  [facility];  Service: Orthopedics;  Laterality: Left;  . TOTAL KNEE ARTHROPLASTY Left 07/28/2015   Procedure: LEFT TOTAL KNEE ARTHROPLASTY;  Surgeon: Ollen GrossFrank Aluisio, MD;  [facility];  Service: Orthopedics;  Laterality: Left;  . TOTAL KNEE ARTHROPLASTY Right 08/06/2019   Procedure: TOTAL KNEE ARTHROPLASTY;  Surgeon: Ollen GrossAluisio, Frank, MD;  [facility];  Service: Orthopedics;  Laterality: Right;  50min    There were no vitals filed for this visit.  Subjective Assessment - 08/17/19 0929    Subjective  Pain is not that bad, uncomfortable last night, interfering with sleep a little bit.    Currently in Pain?  Yes    Pain Score  2     Pain Location  Knee         [facility] PT Assessment - 08/17/19 0001      AROM   AROM Assessment Site  Knee    Right/Left Knee  Right    Right Knee Extension  27    Right Knee Flexion  85                   [facility] Adult PT Treatment/Exercise - 08/17/19 0001      Knee/Hip Exercises: Aerobic   Nustep  level 5 x 6 minutes      Knee/Hip Exercises:  Machines for Strengthening   Cybex Leg Press  40# 2x10; 20# RLE TKE x10      Knee/Hip Exercises: Standing   Other Standing Knee Exercises  TKE RLE ball on wall 2x10       Knee/Hip Exercises: Supine   Short Arc Quad Sets  Right;AROM;Strengthening;2 sets;10 reps      Modalities   Modalities  Vasopneumatic      Vasopneumatic   Number Minutes Vasopneumatic   15 minutes    Vasopnuematic Location   Knee    Vasopneumatic Pressure  Medium    Vasopneumatic Temperature   36      Manual Therapy   Manual Therapy  Joint mobilization;Passive ROM    Manual therapy comments  seated stretching of quads and HS    Joint Mobilization  Anterior posterior 1-2    Passive ROM  R knee flex and ext               PT Short Term Goals - 08/15/19 1032      PT SHORT TERM GOAL #1   Title  independent with initial HEP  Status  Achieved        PT Long Term Goals - 08/17/19 1024      PT LONG TERM GOAL #1   Title  increase AROM of the right knee to 5-115 degrees flexion    Status  On-going      PT LONG TERM GOAL #2   Title  decrease pain 50%    Status  On-going      PT LONG TERM GOAL #3   Title  walk community distances without assistive device and minimal deviation    Status  On-going      PT LONG TERM GOAL #4   Title  go up and down stairs reciprocally    Status  On-going            Plan - 08/17/19 1020    Clinical Impression Statement  patient is making progress toward goals. pt tolerated exercises well considering post op date. pain was reported throughout the treatment as expected  with interventions promoting range of motion. Swelling present, patella easier to find than last time,Did well with exercises with available ROM. VC's for proper SAQ but once corrected quad contraction was accomplshed.    PT Treatment/Interventions  ADLs/Self Care Home Management;Cryotherapy;Electrical Stimulation;Therapeutic activities;Functional mobility training;Stair training;Gait  training;Therapeutic exercise;Balance training;Neuromuscular re-education;Patient/family education;Manual techniques;Vasopneumatic Device    PT Next Visit Plan  slowly get working on the ROM and gait    PT Home Exercise Plan  continue to ice       Patient will benefit from skilled therapeutic intervention in order to improve the following deficits and impairments:  Abnormal gait, Pain, Impaired tone, Decreased scar mobility, Decreased mobility, Cardiopulmonary status limiting activity, Decreased activity tolerance, Decreased endurance, Decreased range of motion, Decreased strength, Impaired flexibility, Increased edema, Difficulty walking, Decreased balance  Visit Diagnosis: Stiffness of right knee, not elsewhere classified  Localized edema  Acute pain of right knee     Problem List Patient Active Problem List   Diagnosis Date Noted  . Osteoarthritis of right knee 08/06/2019  . Postoperative stiffness of total knee replacement ([place]) 10/12/2015  . OA (osteoarthritis) of knee 07/28/2015    Tim [name], SPTA 08/17/2019, 10:27 AM  [place], [place], [address] Phone: [phone]   Fax:  [phone]  Name: Tim [name] MRN: [id number] Date of Birth: August 07, 1956

## 2019-08-20 ENCOUNTER — Encounter: Payer: Self-pay | Admitting: Physical Therapy

## 2019-08-20 ENCOUNTER — Ambulatory Visit: Payer: Commercial Managed Care - PPO | Admitting: Physical Therapy

## 2019-08-20 ENCOUNTER — Other Ambulatory Visit: Payer: Self-pay

## 2019-08-20 DIAGNOSIS — R262 Difficulty in walking, not elsewhere classified: Secondary | ICD-10-CM

## 2019-08-20 DIAGNOSIS — M25661 Stiffness of right knee, not elsewhere classified: Secondary | ICD-10-CM

## 2019-08-20 DIAGNOSIS — M25561 Pain in right knee: Secondary | ICD-10-CM

## 2019-08-20 DIAGNOSIS — R600 Localized edema: Secondary | ICD-10-CM

## 2019-08-20 NOTE — Therapy (Signed)
[facility] [address] [place], [place], [address] Phone: [phone]   Fax:  [phone]  Physical Therapy Treatment  Patient Details  Name: Tim [name] MRN: [id number] Date of Birth: 14-Jun-1956 Referring Provider (PT): Aluisio   Encounter Date: 08/20/2019  PT End of Session - 08/20/19 1352    Visit Number  5    Number of Visits  60    Date for PT Re-Evaluation  10/10/19    PT Start Time  1301    PT Stop Time  1406    PT Time Calculation (min)  65 min    Activity Tolerance  Patient tolerated treatment well    Behavior During Therapy  [facility] for tasks assessed/performed       Past Medical History:  Diagnosis Date  . Arthritis   . Hypertension     Past Surgical History:  Procedure Laterality Date  . KNEE CLOSED REDUCTION Left 10/13/2015   Procedure: LEFT CLOSED MANIPULATION KNEE;  Surgeon: Ollen GrossFrank Aluisio, MD;  [facility]: [facility];  Service: Orthopedics;  Laterality: Left;  . TOTAL KNEE ARTHROPLASTY Left 07/28/2015   Procedure: LEFT TOTAL KNEE ARTHROPLASTY;  Surgeon: Ollen GrossFrank Aluisio, MD;  [facility];  Service: Orthopedics;  Laterality: Left;  . TOTAL KNEE ARTHROPLASTY Right 08/06/2019   Procedure: TOTAL KNEE ARTHROPLASTY;  Surgeon: Ollen GrossAluisio, Frank, MD;  [facility];  Service: Orthopedics;  Laterality: Right;  50min    There were no vitals filed for this visit.  Subjective Assessment - 08/20/19 1255    Subjective  took medicine so pain is better.pain doesnt interfere with sleep schedule as much. did elliptical this weekend. seeing doctor tomorrow for a check up on the knee    Pain Score  1     Pain Location  Knee    Pain Orientation  Right                       [facility] Adult PT Treatment/Exercise - 08/20/19 0001      Knee/Hip Exercises: Aerobic   Nustep  level 5 x 6 minutes      Knee/Hip Exercises: Machines for Strengthening   Cybex Knee Extension  RLE 5# 2x10    Cybex Knee Flexion  RLE  20#  2x10    Cybex Leg Press  40# 2x15; 20# RLE TKE x10   Last     Knee/Hip Exercises: Standing   Forward Step Up  Right;2 sets;10 reps;Step Height: 6"    Other Standing Knee Exercises  TKE RLE ball on wall 2x10, Cable press 50# 15 reps 2 sets     Other Standing Knee Exercises  R toe taps on 8" 2x10   tactile and VC to prevent hip hiking, amd circumduction     Knee/Hip Exercises: Seated   Sit to Sand  10 reps;2 sets;without UE support   on Airex, R Tband used to encourage TKE     Modalities   Modalities  Vasopneumatic      Vasopneumatic   Number Minutes Vasopneumatic   15 minutes    Vasopnuematic Location   Knee    Vasopneumatic Pressure  Medium    Vasopneumatic Temperature   36      Manual Therapy   Manual Therapy  Joint mobilization;Passive ROM    Manual therapy comments  seated stretching of quads and HS    Joint Mobilization  Anterior posterior 1-2    Passive ROM  R knee flex and ext  PT Short Term Goals - 08/15/19 1032      PT SHORT TERM GOAL #1   Title  independent with initial HEP    Status  Achieved        PT Long Term Goals - 08/17/19 1024      PT LONG TERM GOAL #1   Title  increase AROM of the right knee to 5-115 degrees flexion    Status  On-going      PT LONG TERM GOAL #2   Title  decrease pain 50%    Status  On-going      PT LONG TERM GOAL #3   Title  walk community distances without assistive device and minimal deviation    Status  On-going      PT LONG TERM GOAL #4   Title  go up and down stairs reciprocally    Status  On-going            Plan - 08/20/19 1353    Clinical Impression Statement  patient tolerated exercises well considering the exercises introduced today. interventions emphasizing increases in ROM elicit pain. swelling present but not severe. VC's needed to prevent hip hiking, circumduction, weight shifting, and straightening the knee during various exercises.    PT Treatment/Interventions  ADLs/Self  Care Home Management;Cryotherapy;Electrical Stimulation;Therapeutic activities;Functional mobility training;Stair training;Gait training;Therapeutic exercise;Balance training;Neuromuscular re-education;Patient/family education;Manual techniques;Vasopneumatic Device    PT Next Visit Plan  slowly get working on the ROM and gait    PT Home Exercise Plan  continue to ice       Patient will benefit from skilled therapeutic intervention in order to improve the following deficits and impairments:  Abnormal gait, Pain, Impaired tone, Decreased scar mobility, Decreased mobility, Cardiopulmonary status limiting activity, Decreased activity tolerance, Decreased endurance, Decreased range of motion, Decreased strength, Impaired flexibility, Increased edema, Difficulty walking, Decreased balance  Visit Diagnosis: Stiffness of right knee, not elsewhere classified  Localized edema  Acute pain of right knee  Difficulty in walking, not elsewhere classified     Problem List Patient Active Problem List   Diagnosis Date Noted  . Osteoarthritis of right knee 08/06/2019  . Postoperative stiffness of total knee replacement ([place]) 10/12/2015  . OA (osteoarthritis) of knee 07/28/2015    Dylan Trinidee Schrag, SPTA 08/20/2019, 2:06 PM  [place], [place], [address] Phone: [phone]   Fax:  [phone]  Name: Merlin Golden MRN: [id number] Date of Birth: 05/31/1956

## 2019-08-22 ENCOUNTER — Encounter: Payer: Self-pay | Admitting: Physical Therapy

## 2019-08-22 ENCOUNTER — Ambulatory Visit: Payer: Commercial Managed Care - PPO | Attending: Student | Admitting: Physical Therapy

## 2019-08-22 ENCOUNTER — Other Ambulatory Visit: Payer: Self-pay

## 2019-08-22 DIAGNOSIS — M25561 Pain in right knee: Secondary | ICD-10-CM | POA: Insufficient documentation

## 2019-08-22 DIAGNOSIS — R262 Difficulty in walking, not elsewhere classified: Secondary | ICD-10-CM | POA: Diagnosis present

## 2019-08-22 DIAGNOSIS — M25661 Stiffness of right knee, not elsewhere classified: Secondary | ICD-10-CM | POA: Insufficient documentation

## 2019-08-22 DIAGNOSIS — R600 Localized edema: Secondary | ICD-10-CM | POA: Diagnosis present

## 2019-08-22 NOTE — Therapy (Signed)
[facility] [address] [place], [place], [address] Phone: [phone]   Fax:  [phone]  Physical Therapy Treatment  Patient Details  Name: Tim [name] MRN: [id number] Date of Birth: 1956-09-05 Referring Provider (PT): Aluisio   Encounter Date: 08/22/2019  PT End of Session - 08/22/19 0935    Visit Number  6    Number of Visits  60    Date for PT Re-Evaluation  10/10/19    PT Start Time  0844    PT Stop Time  0944    PT Time Calculation (min)  60 min    Activity Tolerance  Patient tolerated treatment well    Behavior During Therapy  [facility] for tasks assessed/performed       Past Medical History:  Diagnosis Date  . Arthritis   . Hypertension     Past Surgical History:  Procedure Laterality Date  . KNEE CLOSED REDUCTION Left 10/13/2015   Procedure: LEFT CLOSED MANIPULATION KNEE;  Surgeon: Ollen GrossFrank Aluisio, MD;  [facility];  Service: Orthopedics;  Laterality: Left;  . TOTAL KNEE ARTHROPLASTY Left 07/28/2015   Procedure: LEFT TOTAL KNEE ARTHROPLASTY;  Surgeon: Ollen GrossFrank Aluisio, MD;  [facility];  Service: Orthopedics;  Laterality: Left;  . TOTAL KNEE ARTHROPLASTY Right 08/06/2019   Procedure: TOTAL KNEE ARTHROPLASTY;  Surgeon: Ollen GrossAluisio, Frank, MD;  [facility];  Service: Orthopedics;  Laterality: Right;  50min    There were no vitals filed for this visit.  Subjective Assessment - 08/22/19 0845    Subjective  "I am good, had a little more pain when I woke up this morning but I took something"    Pertinent History  left TKR 2016    Limitations  Standing;Lifting;Walking;House hold activities    Patient Stated Goals  have good motion, less pain and walk better    Currently in Pain?  Yes    Pain Score  2     Pain Location  Knee    Pain Orientation  Right                       [facility] Adult PT Treatment/Exercise - 08/22/19 0001      Knee/Hip Exercises: Aerobic   Recumbent Bike  L0 seat 12  x3 min some compensation.    Nustep  level 4 x 6 minutes   LE only      Knee/Hip Exercises: Machines for Strengthening   Cybex Knee Flexion  RLE 20#  2x10    Cybex Leg Press  50# 2x15; 30# RLE TKE 2x15      Knee/Hip Exercises: Standing   Other Standing Knee Exercises  Cable press downs 70lb 2x15       Knee/Hip Exercises: Seated   Sit to Sand  10 reps;2 sets;without UE support   airex in blue chair      Modalities   Modalities  Vasopneumatic      Vasopneumatic   Number Minutes Vasopneumatic   10 minutes    Vasopnuematic Location   Knee    Vasopneumatic Pressure  Medium    Vasopneumatic Temperature   36      Manual Therapy   Manual Therapy  Joint mobilization;Passive ROM    Manual therapy comments  seated stretching of quads and HS    Joint Mobilization  Anterior posterior 1-2    Passive ROM  R knee flex and ext  PT Short Term Goals - 08/15/19 1032      PT SHORT TERM GOAL #1   Title  independent with initial HEP    Status  Achieved        PT Long Term Goals - 08/17/19 1024      PT LONG TERM GOAL #1   Title  increase AROM of the right knee to 5-115 degrees flexion    Status  On-going      PT LONG TERM GOAL #2   Title  decrease pain 50%    Status  On-going      PT LONG TERM GOAL #3   Title  walk community distances without assistive device and minimal deviation    Status  On-going      PT LONG TERM GOAL #4   Title  go up and down stairs reciprocally    Status  On-going            Plan - 08/22/19 0936    Clinical Impression Statement  Progress aerobic warm up to the recumbent bike, pt used some hip compensation to make full revolutions. Utilized progressive lowering on leg press to motivate pt to stretch knee on his own. Good effort with all interventions throughout session. R knee extension is very limited and tight with PROM.    Stability/Clinical Decision Making  Evolving/Moderate complexity    Rehab Potential  Good    PT  Frequency  3x / week    PT Duration  8 weeks    PT Treatment/Interventions  ADLs/Self Care Home Management;Cryotherapy;Electrical Stimulation;Therapeutic activities;Functional mobility training;Stair training;Gait training;Therapeutic exercise;Balance training;Neuromuscular re-education;Patient/family education;Manual techniques;Vasopneumatic Device    PT Next Visit Plan  working on the ROM and gait       Patient will benefit from skilled therapeutic intervention in order to improve the following deficits and impairments:  Abnormal gait, Pain, Impaired tone, Decreased scar mobility, Decreased mobility, Cardiopulmonary status limiting activity, Decreased activity tolerance, Decreased endurance, Decreased range of motion, Decreased strength, Impaired flexibility, Increased edema, Difficulty walking, Decreased balance  Visit Diagnosis: Acute pain of right knee  Stiffness of right knee, not elsewhere classified  Localized edema  Difficulty in walking, not elsewhere classified     Problem List Patient Active Problem List   Diagnosis Date Noted  . Osteoarthritis of right knee 08/06/2019  . Postoperative stiffness of total knee replacement ([place]) 10/12/2015  . OA (osteoarthritis) of knee 07/28/2015    Scot Jun, PTA 08/22/2019, 9:49 AM  [place], [place], [address] Phone: [phone]   Fax:  [phone]  Name: Tim [name] MRN: [id number] Date of Birth: [birth year]

## 2019-08-24 ENCOUNTER — Other Ambulatory Visit: Payer: Self-pay

## 2019-08-24 ENCOUNTER — Ambulatory Visit: Payer: Commercial Managed Care - PPO | Admitting: Physical Therapy

## 2019-08-24 ENCOUNTER — Encounter: Payer: Self-pay | Admitting: Physical Therapy

## 2019-08-24 DIAGNOSIS — R600 Localized edema: Secondary | ICD-10-CM

## 2019-08-24 DIAGNOSIS — M25561 Pain in right knee: Secondary | ICD-10-CM

## 2019-08-24 DIAGNOSIS — M25661 Stiffness of right knee, not elsewhere classified: Secondary | ICD-10-CM

## 2019-08-24 NOTE — Therapy (Signed)
[facility] [address] [place], [place], [address] Phone: [phone]   Fax:  [phone]  Physical Therapy Treatment  Patient Details  Name: Tim [name] MRN: [id number] Date of Birth: 09-Dec-1956 Referring Provider (PT): Aluisio   Encounter Date: 08/24/2019  PT End of Session - 08/24/19 0931    Visit Number  7    Number of Visits  60    Date for PT Re-Evaluation  10/10/19    PT Start Time  0852    PT Stop Time  0940    PT Time Calculation (min)  48 min       Past Medical History:  Diagnosis Date  . Arthritis   . Hypertension     Past Surgical History:  Procedure Laterality Date  . KNEE CLOSED REDUCTION Left 10/13/2015   Procedure: LEFT CLOSED MANIPULATION KNEE;  Surgeon: Ollen GrossFrank Aluisio, MD;  [facility];  Service: Orthopedics;  Laterality: Left;  . TOTAL KNEE ARTHROPLASTY Left 07/28/2015   Procedure: LEFT TOTAL KNEE ARTHROPLASTY;  Surgeon: Ollen GrossFrank Aluisio, MD;  [facility];  Service: Orthopedics;  Laterality: Left;  . TOTAL KNEE ARTHROPLASTY Right 08/06/2019   Procedure: TOTAL KNEE ARTHROPLASTY;  Surgeon: Ollen GrossAluisio, Frank, MD;  [facility];  Service: Orthopedics;  Laterality: Right;  50min    There were no vitals filed for this visit.  Subjective Assessment - 08/24/19 0851    Subjective  not as tight as it was before, been stretching hamstrings. stiffness but no reports of pain without any pain meds in system    Currently in Pain?  No/denies    Pain Score  0-No pain         [facility] PT Assessment - 08/24/19 0001      AROM   AROM Assessment Site  Knee    Right/Left Knee  Right    Right Knee Extension  22    Right Knee Flexion  86                   [facility] Adult PT Treatment/Exercise - 08/24/19 0001      Knee/Hip Exercises: Aerobic   Elliptical  I 8 R 6 2.855fwd/2.5bckwd      Knee/Hip Exercises: Machines for Strengthening   Cybex Leg Press  50# 2x15; 30# RLE TKE 1x15      Knee/Hip Exercises: Standing   Other Standing Knee Exercises  Cable press downs 70lb 2x15       Knee/Hip Exercises: Seated   Sit to Sand  10 reps;2 sets;without UE support      Modalities   Modalities  Vasopneumatic      Vasopneumatic   Number Minutes Vasopneumatic   10 minutes    Vasopnuematic Location   Knee    Vasopneumatic Pressure  Medium    Vasopneumatic Temperature   36      Manual Therapy   Manual Therapy  Joint mobilization;Passive ROM    Manual therapy comments  seated stretching of quads and HS    Joint Mobilization  Anterior posterior 1-2    Passive ROM  R knee flex and ext               PT Short Term Goals - 08/15/19 1032      PT SHORT TERM GOAL #1   Title  independent with initial HEP    Status  Achieved        PT Long Term Goals - 08/24/19 40100856  PT LONG TERM GOAL #1   Title  increase AROM of the right knee to 5-115 degrees flexion    Status  On-going      PT LONG TERM GOAL #2   Title  decrease pain 50%    Status  Achieved      PT LONG TERM GOAL #3   Title  walk community distances without assistive device and minimal deviation    Status  On-going      PT LONG TERM GOAL #4   Title  go up and down stairs reciprocally    Status  On-going            Plan - 08/24/19 0931    Clinical Impression Statement  patient was 7 minutes late. progressed to ellipitcal warm up this session, going backwards was more challenging.Leg press was used to encourage ROM with the knee as much as strength. Pt. persevered through expected pain for strtehces and difficulty with exercises. pt. continues to make progress toward all goals evident through increased ROM of the knee. Pt will continue to beenfit from PT to encourage ROM, strength, and functional use of R knee    PT Treatment/Interventions  ADLs/Self Care Home Management;Cryotherapy;Electrical Stimulation;Therapeutic activities;Functional mobility training;Stair training;Gait training;Therapeutic  exercise;Balance training;Neuromuscular re-education;Patient/family education;Manual techniques;Vasopneumatic Device    PT Next Visit Plan  working on the ROM and gait    PT Home Exercise Plan  continue to ice       Patient will benefit from skilled therapeutic intervention in order to improve the following deficits and impairments:  Abnormal gait, Pain, Impaired tone, Decreased scar mobility, Decreased mobility, Cardiopulmonary status limiting activity, Decreased activity tolerance, Decreased endurance, Decreased range of motion, Decreased strength, Impaired flexibility, Increased edema, Difficulty walking, Decreased balance  Visit Diagnosis: Acute pain of right knee  Stiffness of right knee, not elsewhere classified  Localized edema     Problem List Patient Active Problem List   Diagnosis Date Noted  . Osteoarthritis of right knee 08/06/2019  . Postoperative stiffness of total knee replacement ([place]) 10/12/2015  . OA (osteoarthritis) of knee 07/28/2015    Tim [name], Tim [name] 08/24/2019, 9:50 AM  [place], [place], [address] Phone: [phone]   Fax:  [phone]  Name: Tim [name] MRN: [id number] Date of Birth: 07-08-1956

## 2019-08-28 ENCOUNTER — Other Ambulatory Visit: Payer: Self-pay

## 2019-08-28 ENCOUNTER — Ambulatory Visit: Payer: Commercial Managed Care - PPO | Admitting: Physical Therapy

## 2019-08-28 ENCOUNTER — Encounter: Payer: Self-pay | Admitting: Physical Therapy

## 2019-08-28 DIAGNOSIS — R262 Difficulty in walking, not elsewhere classified: Secondary | ICD-10-CM

## 2019-08-28 DIAGNOSIS — R600 Localized edema: Secondary | ICD-10-CM

## 2019-08-28 DIAGNOSIS — M25661 Stiffness of right knee, not elsewhere classified: Secondary | ICD-10-CM

## 2019-08-28 DIAGNOSIS — M25561 Pain in right knee: Secondary | ICD-10-CM

## 2019-08-28 NOTE — Therapy (Signed)
[place], [place], [address] Phone: [phone]   Fax:  [phone]  Physical Therapy Treatment  Patient Details  Name: Tim [name] MRN: [id number] Date of Birth: 1956-06-10 Referring Provider (PT): Aluisio   Encounter Date: 08/28/2019  PT End of Session - 08/28/19 1010    Visit Number  8    Date for PT Re-Evaluation  10/10/19    PT Start Time  0927    PT Stop Time  1021    PT Time Calculation (min)  54 min    Activity Tolerance  Patient tolerated treatment well    Behavior During Therapy  [facility] for tasks assessed/performed       Past Medical History:  Diagnosis Date  . Arthritis   . Hypertension     Past Surgical History:  Procedure Laterality Date  . KNEE CLOSED REDUCTION Left 10/13/2015   Procedure: LEFT CLOSED MANIPULATION KNEE;  Surgeon: Gaynelle Arabian, MD;  [facility];  Service: Orthopedics;  Laterality: Left;  . TOTAL KNEE ARTHROPLASTY Left 07/28/2015   Procedure: LEFT TOTAL KNEE ARTHROPLASTY;  Surgeon: Gaynelle Arabian, MD;  [facility];  Service: Orthopedics;  Laterality: Left;  . TOTAL KNEE ARTHROPLASTY Right 08/06/2019   Procedure: TOTAL KNEE ARTHROPLASTY;  Surgeon: Gaynelle Arabian, MD;  [facility];  Service: Orthopedics;  Laterality: Right;  21min    There were no vitals filed for this visit.  Subjective Assessment - 08/28/19 0927    Subjective  Hamstring tightness reported over the weekend    Pertinent History  left TKR 2016    Limitations  Standing;Lifting;Walking;House hold activities    Patient Stated Goals  have good motion, less pain and walk better    Currently in Pain?  No/denies                       [facility] Adult PT Treatment/Exercise - 08/28/19 0001      Knee/Hip Exercises: Aerobic   Elliptical  I 7 R 5 57fwd/2bckwd    Recumbent Bike  L0 seat 12 x3 min some compensation.      Knee/Hip Exercises: Machines for Strengthening   Cybex  Knee Extension  RLE 5# 2x10    Cybex Knee Flexion  RLE 20#  2x10    Cybex Leg Press  30# 2x15 progressive lowering level 8; 30# RLE TKE 2x10      Knee/Hip Exercises: Supine   Short Arc Quad Sets  Right;3 sets;10 reps;Strengthening    Short Arc Quad Sets Limitations  2      Modalities   Modalities  Vasopneumatic      Vasopneumatic   Number Minutes Vasopneumatic   10 minutes    Vasopnuematic Location   Knee    Vasopneumatic Pressure  Medium    Vasopneumatic Temperature   36      Manual Therapy   Manual therapy comments  seated stretching of quads and HS    Joint Mobilization  Anterior posterior 1-2    Passive ROM  R knee flex and ext               PT Short Term Goals - 08/15/19 1032      PT SHORT TERM GOAL #1   Title  independent with initial HEP    Status  Achieved        PT Long Term Goals - 08/24/19 0856      PT LONG TERM GOAL #1  Title  increase AROM of the right knee to 5-115 degrees flexion    Status  On-going      PT LONG TERM GOAL #2   Title  decrease pain 50%    Status  Achieved      PT LONG TERM GOAL #3   Title  walk community distances without assistive device and minimal deviation    Status  On-going      PT LONG TERM GOAL #4   Title  go up and down stairs reciprocally    Status  On-going            Plan - 08/28/19 1011    Clinical Impression Statement  Pt still faces some challenges with backwards on elliptical warm up. Some compensation with recumbent bike in order to make full revolutions. Focuses on SL strengthen and ROM. R knee is limited with extensions. Most MT time spent on increasing extension ROM.R  Hamstrings are very tight    Stability/Clinical Decision Making  Evolving/Moderate complexity    Rehab Potential  Good    PT Frequency  3x / week    PT Duration  8 weeks    PT Treatment/Interventions  ADLs/Self Care Home Management;Cryotherapy;Electrical Stimulation;Therapeutic activities;Functional mobility training;Stair  training;Gait training;Therapeutic exercise;Balance training;Neuromuscular re-education;Patient/family education;Manual techniques;Vasopneumatic Device    PT Next Visit Plan  working on the ROM and gait       Patient will benefit from skilled therapeutic intervention in order to improve the following deficits and impairments:  Abnormal gait, Pain, Impaired tone, Decreased scar mobility, Decreased mobility, Cardiopulmonary status limiting activity, Decreased activity tolerance, Decreased endurance, Decreased range of motion, Decreased strength, Impaired flexibility, Increased edema, Difficulty walking, Decreased balance  Visit Diagnosis: Localized edema  Difficulty in walking, not elsewhere classified  Acute pain of right knee  Stiffness of right knee, not elsewhere classified     Problem List Patient Active Problem List   Diagnosis Date Noted  . Osteoarthritis of right knee 08/06/2019  . Postoperative stiffness of total knee replacement ([facility]) 10/12/2015  . OA (osteoarthritis) of knee 07/28/2015    Grayce Sessionsonald G Stepan Verrette, PTA 08/28/2019, 10:24 AM  [facility] [address] [place], [place], [address] Phone: [phone]   Fax:  [phone]  Name: Tim [name] MRN: [id number] Date of Birth: 12-May-1956

## 2019-08-30 ENCOUNTER — Encounter: Payer: Self-pay | Admitting: Physical Therapy

## 2019-08-30 ENCOUNTER — Ambulatory Visit: Payer: Commercial Managed Care - PPO | Admitting: Physical Therapy

## 2019-08-30 ENCOUNTER — Other Ambulatory Visit: Payer: Self-pay

## 2019-08-30 DIAGNOSIS — M25561 Pain in right knee: Secondary | ICD-10-CM | POA: Diagnosis not present

## 2019-08-30 DIAGNOSIS — M25661 Stiffness of right knee, not elsewhere classified: Secondary | ICD-10-CM

## 2019-08-30 DIAGNOSIS — R262 Difficulty in walking, not elsewhere classified: Secondary | ICD-10-CM

## 2019-08-30 DIAGNOSIS — R600 Localized edema: Secondary | ICD-10-CM

## 2019-08-30 NOTE — Therapy (Signed)
[place], [place], [address] Phone: [phone]   Fax:  [phone]  Physical Therapy Treatment  Patient Details  Name: Tim [name] MRN: [id number] Date of Birth: 1956-03-10 Referring Provider (PT): Aluisio   Encounter Date: 08/30/2019  PT End of Session - 08/30/19 1011    Visit Number  9    Number of Visits  60    PT Start Time  0927    PT Stop Time  1021    PT Time Calculation (min)  54 min    Activity Tolerance  Patient tolerated treatment well    Behavior During Therapy  [facility] for tasks assessed/performed       Past Medical History:  Diagnosis Date  . Arthritis   . Hypertension     Past Surgical History:  Procedure Laterality Date  . KNEE CLOSED REDUCTION Left 10/13/2015   Procedure: LEFT CLOSED MANIPULATION KNEE;  Surgeon: Gaynelle Arabian, MD;  Location: [facility];  Service: Orthopedics;  Laterality: Left;  . TOTAL KNEE ARTHROPLASTY Left 07/28/2015   Procedure: LEFT TOTAL KNEE ARTHROPLASTY;  Surgeon: Gaynelle Arabian, MD;  Location: [facility];  Service: Orthopedics;  Laterality: Left;  . TOTAL KNEE ARTHROPLASTY Right 08/06/2019   Procedure: TOTAL KNEE ARTHROPLASTY;  Surgeon: Gaynelle Arabian, MD;  Location: [facility];  Service: Orthopedics;  Laterality: Right;  74min    There were no vitals filed for this visit.  Subjective Assessment - 08/30/19 0931    Subjective  "Good"    Limitations  Standing;Lifting;Walking;House hold activities    Patient Stated Goals  have good motion, less pain and walk better    Currently in Pain?  No/denies                       [facility] Adult PT Treatment/Exercise - 08/30/19 0001      Knee/Hip Exercises: Stretches   Passive Hamstring Stretch  Right;4 reps;10 seconds   with some hip distraction     Knee/Hip Exercises: Aerobic   Elliptical  I 7 R 5 59fwd/2bckwd    Recumbent Bike  L0 seat 12 x4 min some compensation.      Knee/Hip Exercises: Machines  for Strengthening   Cybex Knee Extension  RLE 10# 2x10    Cybex Knee Flexion  RLE 20#  2x10    Cybex Leg Press  40# 2x15 progressive lowering       Knee/Hip Exercises: Seated   Sit to Sand  10 reps;2 sets;without UE support   Round airex in blue chair      Knee/Hip Exercises: Supine   Short Arc Quad Sets  Right;3 sets;10 reps;Strengthening    Short Arc Quad Sets Limitations  2    Straight Leg Raises  Right;Strengthening;1 set;10 reps    Straight Leg Raises Limitations  2      Modalities   Modalities  Vasopneumatic      Vasopneumatic   Number Minutes Vasopneumatic   10 minutes    Vasopnuematic Location   Knee    Vasopneumatic Pressure  Medium    Vasopneumatic Temperature   36      Manual Therapy   Manual Therapy  Joint mobilization;Passive ROM    Manual therapy comments  seated stretching of quads and HS    Joint Mobilization  Anterior posterior 1-2    Passive ROM  R knee flex and ext  PT Short Term Goals - 08/15/19 1032      PT SHORT TERM GOAL #1   Title  independent with initial HEP    Status  Achieved        PT Long Term Goals - 08/24/19 0856      PT LONG TERM GOAL #1   Title  increase AROM of the right knee to 5-115 degrees flexion    Status  On-going      PT LONG TERM GOAL #2   Title  decrease pain 50%    Status  Achieved      PT LONG TERM GOAL #3   Title  walk community distances without assistive device and minimal deviation    Status  On-going      PT LONG TERM GOAL #4   Title  go up and down stairs reciprocally    Status  On-going            Plan - 08/30/19 1012    Clinical Impression Statement  Pt ambulated in clinic without AD. R knee extensions ROM is limited causing pt to ambulate with a slight limp. He reports no pain. Cues to stand erect while on elliptical warm up. Compensation needed to make full revolutions on recumbent bike. No issues with machine level strengthening. Pt R hamstring is very tight and could be  causing R knee to lack some extension.    Stability/Clinical Decision Making  Evolving/Moderate complexity    Rehab Potential  Good    PT Frequency  3x / week    PT Duration  8 weeks    PT Treatment/Interventions  ADLs/Self Care Home Management;Cryotherapy;Electrical Stimulation;Therapeutic activities;Functional mobility training;Stair training;Gait training;Therapeutic exercise;Balance training;Neuromuscular re-education;Patient/family education;Manual techniques;Vasopneumatic Device    PT Next Visit Plan  working on the ROM and gait       Patient will benefit from skilled therapeutic intervention in order to improve the following deficits and impairments:  Abnormal gait, Pain, Impaired tone, Decreased scar mobility, Decreased mobility, Cardiopulmonary status limiting activity, Decreased activity tolerance, Decreased endurance, Decreased range of motion, Decreased strength, Impaired flexibility, Increased edema, Difficulty walking, Decreased balance  Visit Diagnosis: Difficulty in walking, not elsewhere classified  Acute pain of right knee  Stiffness of right knee, not elsewhere classified  Localized edema     Problem List Patient Active Problem List   Diagnosis Date Noted  . Osteoarthritis of right knee 08/06/2019  . Postoperative stiffness of total knee replacement (HCC) 10/12/2015  . OA (osteoarthritis) of knee 07/28/2015    Grayce Sessionsonald G Nissi Doffing, PTA 08/30/2019, 10:14 AM  [facility] [address] [place], [place], [address] Phone: [phone]   Fax:  [phone]  Name: Tim [name] MRN: [id number] Date of Birth: [birth year]

## 2019-08-31 ENCOUNTER — Encounter: Payer: Self-pay | Admitting: Physical Therapy

## 2019-08-31 ENCOUNTER — Ambulatory Visit: Payer: Commercial Managed Care - PPO | Admitting: Physical Therapy

## 2019-08-31 DIAGNOSIS — M25661 Stiffness of right knee, not elsewhere classified: Secondary | ICD-10-CM

## 2019-08-31 DIAGNOSIS — R600 Localized edema: Secondary | ICD-10-CM

## 2019-08-31 DIAGNOSIS — R262 Difficulty in walking, not elsewhere classified: Secondary | ICD-10-CM

## 2019-08-31 DIAGNOSIS — M25561 Pain in right knee: Secondary | ICD-10-CM

## 2019-08-31 NOTE — Therapy (Signed)
[place], [place], [address] Phone: [phone]   Fax:  [phone]  Physical Therapy Treatment  Patient Details  Name: Tim [name] MRN: [id number] Date of Birth: May 21, 1956 Referring Provider (PT): Aluisio   Encounter Date: 08/31/2019  PT End of Session - 08/31/19 1018    Visit Number  10    Number of Visits  60    Date for PT Re-Evaluation  10/10/19    PT Start Time  0927    PT Stop Time  1027    PT Time Calculation (min)  60 min    Activity Tolerance  Patient tolerated treatment well    Behavior During Therapy  [facility] for tasks assessed/performed       Past Medical History:  Diagnosis Date  . Arthritis   . Hypertension     Past Surgical History:  Procedure Laterality Date  . KNEE CLOSED REDUCTION Left 10/13/2015   Procedure: LEFT CLOSED MANIPULATION KNEE;  Surgeon: Gaynelle Arabian, MD;  [facility];  Service: Orthopedics;  Laterality: Left;  . TOTAL KNEE ARTHROPLASTY Left 07/28/2015   Procedure: LEFT TOTAL KNEE ARTHROPLASTY;  Surgeon: Gaynelle Arabian, MD;  [facility];  Service: Orthopedics;  Laterality: Left;  . TOTAL KNEE ARTHROPLASTY Right 08/06/2019   Procedure: TOTAL KNEE ARTHROPLASTY;  Surgeon: Gaynelle Arabian, MD;  [facility];  Service: Orthopedics;  Laterality: Right;  4min    There were no vitals filed for this visit.  Subjective Assessment - 08/31/19 0928    Subjective  "Im good"    Limitations  Standing;Lifting;Walking;House hold activities    Patient Stated Goals  have good motion, less pain and walk better    Currently in Pain?  No/denies    Pain Score  0-No pain                       [facility] Adult PT Treatment/Exercise - 08/31/19 0001      Knee/Hip Exercises: Aerobic   Elliptical  I 10 R 5 58fwd/2bckwd      Knee/Hip Exercises: Machines for Strengthening   Cybex Knee Extension  RLE 10# 2x15    Cybex Knee Flexion  RLE 20#  2x15    Cybex  Leg Press  30lb RLE TKE 2x15       Knee/Hip Exercises: Standing   Forward Step Up  Right;2 sets;10 reps;Hand Hold: 1;Step Height: 8"   with LLE hip flex    Walking with Sports Cord  resisted side step 40lb x3 each side      Knee/Hip Exercises: Seated   Sit to Sand  10 reps;2 sets;without UE support   round airex in blue chair      Modalities   Modalities  Vasopneumatic      Vasopneumatic   Number Minutes Vasopneumatic   10 minutes    Vasopnuematic Location   Knee    Vasopneumatic Pressure  Medium    Vasopneumatic Temperature   36      Manual Therapy   Manual Therapy  Joint mobilization;Passive ROM    Joint Mobilization  Anterior posterior 1-2    Passive ROM  R knee flex and ext               PT Short Term Goals - 08/15/19 1032      PT SHORT TERM GOAL #1   Title  independent with initial HEP    Status  Achieved  PT Long Term Goals - 08/24/19 0856      PT LONG TERM GOAL #1   Title  increase AROM of the right knee to 5-115 degrees flexion    Status  On-going      PT LONG TERM GOAL #2   Title  decrease pain 50%    Status  Achieved      PT LONG TERM GOAL #3   Title  walk community distances without assistive device and minimal deviation    Status  On-going      PT LONG TERM GOAL #4   Title  go up and down stairs reciprocally    Status  On-going            Plan - 08/31/19 1020    Clinical Impression Statement  Pt did really well and gave good effort. He continues to lack some extensions of the R knee. Cues not to compensate during MT, to keep hips down. Pt has very tight R hamstring that could be limiting some R knee extension. Some instability during resisted gait when the resistance was on pt's L side.    Stability/Clinical Decision Making  Evolving/Moderate complexity    Rehab Potential  Good    PT Frequency  3x / week    PT Duration  8 weeks    PT Treatment/Interventions  ADLs/Self Care Home Management;Cryotherapy;Electrical  Stimulation;Therapeutic activities;Functional mobility training;Stair training;Gait training;Therapeutic exercise;Balance training;Neuromuscular re-education;Patient/family education;Manual techniques;Vasopneumatic Device    PT Next Visit Plan  working on the ROM and gait       Patient will benefit from skilled therapeutic intervention in order to improve the following deficits and impairments:  Abnormal gait, Pain, Impaired tone, Decreased scar mobility, Decreased mobility, Cardiopulmonary status limiting activity, Decreased activity tolerance, Decreased endurance, Decreased range of motion, Decreased strength, Impaired flexibility, Increased edema, Difficulty walking, Decreased balance  Visit Diagnosis: Acute pain of right knee  Stiffness of right knee, not elsewhere classified  Localized edema  Difficulty in walking, not elsewhere classified     Problem List Patient Active Problem List   Diagnosis Date Noted  . Osteoarthritis of right knee 08/06/2019  . Postoperative stiffness of total knee replacement (HCC) 10/12/2015  . OA (osteoarthritis) of knee 07/28/2015    Grayce Sessionsonald G Janathan Bribiesca, PTA 08/31/2019, 10:24 AM  [facility] [address] [place], [place], [address] Phone: [phone]   Fax:  [phone]  Name: Tim [name] MRN: [id number] Date of Birth: Jul 01, 1956

## 2019-09-03 ENCOUNTER — Other Ambulatory Visit: Payer: Self-pay

## 2019-09-03 ENCOUNTER — Ambulatory Visit: Payer: Commercial Managed Care - PPO | Admitting: Physical Therapy

## 2019-09-03 DIAGNOSIS — M25561 Pain in right knee: Secondary | ICD-10-CM

## 2019-09-03 DIAGNOSIS — R600 Localized edema: Secondary | ICD-10-CM

## 2019-09-03 DIAGNOSIS — M25661 Stiffness of right knee, not elsewhere classified: Secondary | ICD-10-CM

## 2019-09-03 NOTE — Therapy (Signed)
[place], [place], [address] Phone: [phone]   Fax:  [phone]  Physical Therapy Treatment  Patient Details  Name: Tim [name] MRN: [id number] Date of Birth: [birth year] Referring Provider (PT): Aluisio   Encounter Date: 09/03/2019  PT End of Session - 09/03/19 0843    Visit Number  11    Number of Visits  60    Date for PT Re-Evaluation  10/10/19    PT Start Time  0800    PT Stop Time  0852    PT Time Calculation (min)  52 min    Activity Tolerance  Patient tolerated treatment well    Behavior During Therapy  [facility] for tasks assessed/performed       Past Medical History:  Diagnosis Date  . Arthritis   . Hypertension     Past Surgical History:  Procedure Laterality Date  . KNEE CLOSED REDUCTION Left 10/13/2015   Procedure: LEFT CLOSED MANIPULATION KNEE;  Surgeon: Gaynelle Arabian, MD;  [facility];  Service: Orthopedics;  Laterality: Left;  . TOTAL KNEE ARTHROPLASTY Left 07/28/2015   Procedure: LEFT TOTAL KNEE ARTHROPLASTY;  Surgeon: Gaynelle Arabian, MD;  [facility];  Service: Orthopedics;  Laterality: Left;  . TOTAL KNEE ARTHROPLASTY Right 08/06/2019   Procedure: TOTAL KNEE ARTHROPLASTY;  Surgeon: Gaynelle Arabian, MD;  [facility]: [facility];  Service: Orthopedics;  Laterality: Right;  28min    There were no vitals filed for this visit.  Subjective Assessment - 09/03/19 0801    Subjective  feeling good but R knee very stiff and a little sore this morning, c/o HS tightness, performed HEP today    Currently in Pain?  No/denies    Pain Score  0-No pain                       [facility] Adult PT Treatment/Exercise - 09/03/19 0001      Knee/Hip Exercises: Aerobic   Elliptical  I 10 R 5 60fwd/3bckwd      Knee/Hip Exercises: Machines for Strengthening   Cybex Knee Extension  RLE 15# 2x10    Cybex Knee Flexion  RLE 25# 2x10    Cybex Leg Press  30lb RLE TKE 2x15       Knee/Hip Exercises: Standing   Forward Step Up  Right;2 sets;10 reps;Hand Hold: 1;Step Height: 8"   paired with simultaneous LLE hip flexion   Walking with Sports Cord  resisted side step 40lb x3 each side   1 minor LOB, self corrected     Knee/Hip Exercises: Seated   Sit to Sand  10 reps;2 sets;without UE support; cues to press heel in to ground while manual pressure applied for TKE     Modalities   Modalities  Vasopneumatic      Vasopneumatic   Number Minutes Vasopneumatic   10 minutes    Vasopnuematic Location   Knee    Vasopneumatic Pressure  Medium    Vasopneumatic Temperature   36      Manual Therapy   Manual Therapy  Joint mobilization;Passive ROM    Manual therapy comments  seated stretching of quads and HS    Joint Mobilization  Anterior posterior 1-2    Passive ROM  R knee flex and ext               PT Short Term Goals - 08/15/19 1032      PT SHORT TERM GOAL #1  Title  independent with initial HEP    Status  Achieved        PT Long Term Goals - 08/24/19 0856      PT LONG TERM GOAL #1   Title  increase AROM of the right knee to 5-115 degrees flexion    Status  On-going      PT LONG TERM GOAL #2   Title  decrease pain 50%    Status  Achieved      PT LONG TERM GOAL #3   Title  walk community distances without assistive device and minimal deviation    Status  On-going      PT LONG TERM GOAL #4   Title  go up and down stairs reciprocally    Status  On-going            Plan - 09/03/19 0844    Clinical Impression Statement  pt did really well with exercises pushing himself to get better and better with each treatment. R knee lacks extension, focused TKE while stretching HS at the same time. slight instability during resisted gait but able to self correct    PT Treatment/Interventions  ADLs/Self Care Home Management;Cryotherapy;Electrical Stimulation;Therapeutic activities;Functional mobility training;Stair training;Gait training;Therapeutic  exercise;Balance training;Neuromuscular re-education;Patient/family education;Manual techniques;Vasopneumatic Device    PT Next Visit Plan  working on the ROM and gait    PT Home Exercise Plan  continue to ice       Patient will benefit from skilled therapeutic intervention in order to improve the following deficits and impairments:  Abnormal gait, Pain, Impaired tone, Decreased scar mobility, Decreased mobility, Cardiopulmonary status limiting activity, Decreased activity tolerance, Decreased endurance, Decreased range of motion, Decreased strength, Impaired flexibility, Increased edema, Difficulty walking, Decreased balance  Visit Diagnosis: Acute pain of right knee  Stiffness of right knee, not elsewhere classified  Localized edema     Problem List Patient Active Problem List   Diagnosis Date Noted  . Osteoarthritis of right knee 08/06/2019  . Postoperative stiffness of total knee replacement (HCC) 10/12/2015  . OA (osteoarthritis) of knee 07/28/2015    Dylan Jupiter Kabir, SPTA 09/03/2019, 8:49 AM  [facility] [address] [place], [place], [address] Phone: [phone]   Fax:  [phone]  Name: Tim [name] MRN: [id number] Date of Birth: 02-26-1956

## 2019-09-05 ENCOUNTER — Encounter: Payer: Self-pay | Admitting: Physical Therapy

## 2019-09-05 ENCOUNTER — Ambulatory Visit: Payer: Commercial Managed Care - PPO | Admitting: Physical Therapy

## 2019-09-05 ENCOUNTER — Other Ambulatory Visit: Payer: Self-pay

## 2019-09-05 DIAGNOSIS — M25561 Pain in right knee: Secondary | ICD-10-CM

## 2019-09-05 DIAGNOSIS — M25661 Stiffness of right knee, not elsewhere classified: Secondary | ICD-10-CM

## 2019-09-05 DIAGNOSIS — R600 Localized edema: Secondary | ICD-10-CM

## 2019-09-05 NOTE — Therapy (Signed)
[place], [place], [address] Phone: [phone]   Fax:  [phone]  Physical Therapy Treatment  Patient Details  Name: Polo Mcmartin MRN: [id number] Date of Birth: [birth year] Referring Provider (PT): Aluisio   Encounter Date: 09/05/2019  PT End of Session - 09/05/19 0927    Visit Number  12    Number of Visits  60    Date for PT Re-Evaluation  10/10/19    PT Start Time  3419    PT Stop Time  0936    PT Time Calculation (min)  49 min       Past Medical History:  Diagnosis Date  . Arthritis   . Hypertension     Past Surgical History:  Procedure Laterality Date  . KNEE CLOSED REDUCTION Left 10/13/2015   Procedure: LEFT CLOSED MANIPULATION KNEE;  Surgeon: Gaynelle Arabian, MD;  [facility];  Service: Orthopedics;  Laterality: Left;  . TOTAL KNEE ARTHROPLASTY Left 07/28/2015   Procedure: LEFT TOTAL KNEE ARTHROPLASTY;  Surgeon: Gaynelle Arabian, MD;  [facility];  Service: Orthopedics;  Laterality: Left;  . TOTAL KNEE ARTHROPLASTY Right 08/06/2019   Procedure: TOTAL KNEE ARTHROPLASTY;  Surgeon: Gaynelle Arabian, MD;  [facility];  Service: Orthopedics;  Laterality: Right;  3min    There were no vitals filed for this visit.  Subjective Assessment - 09/05/19 0847    Subjective  feeling good, feels like he can bend it more and can get more out of it day by day. little sore in knee.    Currently in Pain?  No/denies    Pain Score  0-No pain                       [facility] Adult PT Treatment/Exercise - 09/05/19 0001      Knee/Hip Exercises: Aerobic   Elliptical  I 10 R 5 64fwd/3bckwd      Knee/Hip Exercises: Machines for Strengthening   Cybex Knee Extension  RLE 15# 1x15    Cybex Knee Flexion  RLE 25# 1x15    Cybex Leg Press  40lb RLE TKE 2x10       Knee/Hip Exercises: Standing   Forward Step Up  Right;2 sets;10 reps;Hand Hold: 1;Step Height: 8"   paired with L hip  flexion   Walking with Sports Cord  resisted forward, and side step 40lb x5 each side      Knee/Hip Exercises: Seated   Sit to Sand  without UE support;1 set;15 reps      Modalities   Modalities  Vasopneumatic      Vasopneumatic   Number Minutes Vasopneumatic   10 minutes    Vasopnuematic Location   Knee    Vasopneumatic Pressure  Medium    Vasopneumatic Temperature   36      Manual Therapy   Manual Therapy  Joint mobilization;Passive ROM    Manual therapy comments  prone stretch of quads with PNF contract-relax, supine stretch of HS using reciprocal inhibition    Joint Mobilization  Anterior posterior 1-2    Passive ROM  R knee flex and ext               PT Short Term Goals - 08/15/19 1032      PT SHORT TERM GOAL #1   Title  independent with initial HEP    Status  Achieved        PT Long Term Goals -  08/24/19 0856      PT LONG TERM GOAL #1   Title  increase AROM of the right knee to 5-115 degrees flexion    Status  On-going      PT LONG TERM GOAL #2   Title  decrease pain 50%    Status  Achieved      PT LONG TERM GOAL #3   Title  walk community distances without assistive device and minimal deviation    Status  On-going      PT LONG TERM GOAL #4   Title  go up and down stairs reciprocally    Status  On-going            Plan - 09/05/19 [id number]    Clinical Impression Statement  pt. did well with exercises, reports of pain throughout exercises as expected with stretching and exercises promoting an increase in ROM. Pt persevered and completed all exercises despite visible difficulty and fatigue. Cues to bear weight into heel of Rt leg to encourage extension of the knee.    PT Treatment/Interventions  ADLs/Self Care Home Management;Cryotherapy;Electrical Stimulation;Therapeutic activities;Functional mobility training;Stair training;Gait training;Therapeutic exercise;Balance training;Neuromuscular re-education;Patient/family education;Manual  techniques;Vasopneumatic Device    PT Next Visit Plan  working on the ROM and gait    PT Home Exercise Plan  continue to ice       Patient will benefit from skilled therapeutic intervention in order to improve the following deficits and impairments:  Abnormal gait, Pain, Impaired tone, Decreased scar mobility, Decreased mobility, Cardiopulmonary status limiting activity, Decreased activity tolerance, Decreased endurance, Decreased range of motion, Decreased strength, Impaired flexibility, Increased edema, Difficulty walking, Decreased balance  Visit Diagnosis: Acute pain of right knee  Stiffness of right knee, not elsewhere classified  Localized edema     Problem List Patient Active Problem List   Diagnosis Date Noted  . Osteoarthritis of right knee 08/06/2019  . Postoperative stiffness of total knee replacement (HCC) 10/12/2015  . OA (osteoarthritis) of knee 07/28/2015    Rosamaria LintsDylan Mikell Kazlauskas, SPTA 09/05/2019, 9:37 AM  [facility] [address] [place], [place], [address] Phone: [phone]   Fax:  [phone]  Name: Kathleene Hazellonzo Mahlum MRN: [id number] Date of Birth: 06-04-1956

## 2019-09-07 ENCOUNTER — Other Ambulatory Visit: Payer: Self-pay

## 2019-09-07 ENCOUNTER — Ambulatory Visit: Payer: Commercial Managed Care - PPO | Admitting: Physical Therapy

## 2019-09-07 ENCOUNTER — Encounter: Payer: Self-pay | Admitting: Physical Therapy

## 2019-09-07 DIAGNOSIS — R600 Localized edema: Secondary | ICD-10-CM

## 2019-09-07 DIAGNOSIS — M25561 Pain in right knee: Secondary | ICD-10-CM | POA: Diagnosis not present

## 2019-09-07 DIAGNOSIS — M25661 Stiffness of right knee, not elsewhere classified: Secondary | ICD-10-CM

## 2019-09-07 DIAGNOSIS — R262 Difficulty in walking, not elsewhere classified: Secondary | ICD-10-CM

## 2019-09-07 NOTE — Therapy (Signed)
[place], [place], [address] Phone: [phone]   Fax:  [phone]  Physical Therapy Treatment  Patient Details  Name: Tim [name] MRN: [id number] Date of Birth: [birth year] Referring Provider (PT): Aluisio   Encounter Date: 09/07/2019    Past Medical History:  Diagnosis Date  . Arthritis   . Hypertension     Past Surgical History:  Procedure Laterality Date  . KNEE CLOSED REDUCTION Left 10/13/2015   Procedure: LEFT CLOSED MANIPULATION KNEE;  Surgeon: Gaynelle Arabian, MD;  Location: [facility];  Service: Orthopedics;  Laterality: Left;  . TOTAL KNEE ARTHROPLASTY Left 07/28/2015   Procedure: LEFT TOTAL KNEE ARTHROPLASTY;  Surgeon: Gaynelle Arabian, MD;  Location: [facility];  Service: Orthopedics;  Laterality: Left;  . TOTAL KNEE ARTHROPLASTY Right 08/06/2019   Procedure: TOTAL KNEE ARTHROPLASTY;  Surgeon: Gaynelle Arabian, MD;  Location: [facility];  Service: Orthopedics;  Laterality: Right;  83mn    There were no vitals filed for this visit.  Subjective Assessment - 09/07/19 0805    Subjective  "It is just stiff"    Patient Stated Goals  have good motion, less pain and walk better    Currently in Pain?  No/denies    Pain Location  Knee    Pain Descriptors / Indicators  --   stiff        [facility] PT Assessment - 09/07/19 0001      AROM   AROM Assessment Site  Knee;Other (comment)   Taken in supine   Right Knee Extension  25    Right Knee Flexion  95                   [facility] Adult PT Treatment/Exercise - 09/07/19 0001      Knee/Hip Exercises: Aerobic   Elliptical  I 10 R 5 260f/2bckwd    Nustep  L4 x4 min LE only       Knee/Hip Exercises: Machines for Strengthening   Cybex Knee Extension  RLE 10# 2x15    Cybex Knee Flexion  RLE 25# 2x15    Cybex Leg Press  60lb bilat Level 8 2x10, 20lb RLE       Knee/Hip Exercises: Standing   Forward Step Up  Right;2 sets;10 reps;Hand Hold: 0;Step  Height: 8"    Other Standing Knee Exercises  Sit to stand from blue chaie No UE, blocking RLE from coming forward 2x10       Modalities   Modalities  Vasopneumatic      Vasopneumatic   Number Minutes Vasopneumatic   10 minutes    Vasopnuematic Location   Knee    Vasopneumatic Pressure  Medium    Vasopneumatic Temperature   36      Manual Therapy   Manual Therapy  Joint mobilization;Passive ROM    Joint Mobilization  Anterior posterior 1-2    Passive ROM  R knee extension               PT Short Term Goals - 08/15/19 1032      PT SHORT TERM GOAL #1   Title  independent with initial HEP    Status  Achieved        PT Long Term Goals - 09/07/19 0844      PT LONG TERM GOAL #1   Title  increase AROM of the right knee to 5-115 degrees flexion    Status  On-going      PT LONG  TERM GOAL #2   Title  decrease pain 50%    Status  Achieved      PT LONG TERM GOAL #3   Title  walk community distances without assistive device and minimal deviation    Status  Partially Met      PT LONG TERM GOAL #4   Title  go up and down stairs reciprocally    Status  On-going            Plan - 09/07/19 0845    Clinical Impression Statement  Pt continues to do well. No issues with today's interventions. Cues not to circumduct RLE with step ups. Pt with good effort throughout session. Some improvement with R knee flexion but pt is still very limited with R knee extension.    Stability/Clinical Decision Making  Evolving/Moderate complexity    Rehab Potential  Good    PT Frequency  3x / week    PT Duration  8 weeks    PT Treatment/Interventions  ADLs/Self Care Home Management;Cryotherapy;Electrical Stimulation;Therapeutic activities;Functional mobility training;Stair training;Gait training;Therapeutic exercise;Balance training;Neuromuscular re-education;Patient/family education;Manual techniques;Vasopneumatic Device    PT Next Visit Plan  working on the ROM and gait       Patient  will benefit from skilled therapeutic intervention in order to improve the following deficits and impairments:  Abnormal gait, Pain, Impaired tone, Decreased scar mobility, Decreased mobility, Cardiopulmonary status limiting activity, Decreased activity tolerance, Decreased endurance, Decreased range of motion, Decreased strength, Impaired flexibility, Increased edema, Difficulty walking, Decreased balance  Visit Diagnosis: Acute pain of right knee  Stiffness of right knee, not elsewhere classified  Localized edema  Difficulty in walking, not elsewhere classified     Problem List Patient Active Problem List   Diagnosis Date Noted  . Osteoarthritis of right knee 08/06/2019  . Postoperative stiffness of total knee replacement ([facility]) 10/12/2015  . OA (osteoarthritis) of knee 07/28/2015    Scot Jun, PTA 09/07/2019, 8:48 AM  [place], [place], [address] Phone: [phone]   Fax:  [phone]  Name: Tim [name] MRN: [id number] Date of Birth: 18-Jun-1956

## 2019-09-10 ENCOUNTER — Encounter: Payer: Self-pay | Admitting: Physical Therapy

## 2019-09-10 ENCOUNTER — Other Ambulatory Visit: Payer: Self-pay

## 2019-09-10 ENCOUNTER — Ambulatory Visit: Payer: Commercial Managed Care - PPO | Admitting: Physical Therapy

## 2019-09-10 DIAGNOSIS — R600 Localized edema: Secondary | ICD-10-CM

## 2019-09-10 DIAGNOSIS — M25561 Pain in right knee: Secondary | ICD-10-CM

## 2019-09-10 DIAGNOSIS — M25661 Stiffness of right knee, not elsewhere classified: Secondary | ICD-10-CM

## 2019-09-10 NOTE — Therapy (Signed)
[place], [place], [address] Phone: [phone]   Fax:  [phone]  Physical Therapy Treatment  Patient Details  Name: Tim [name] MRN: [id number] Date of Birth: 1956-07-04 Referring Provider (PT): Aluisio   Encounter Date: 09/10/2019  PT End of Session - 09/10/19 1019    Visit Number  13    Date for PT Re-Evaluation  10/10/19    PT Start Time  0930    PT Stop Time  1025    PT Time Calculation (min)  55 min    Activity Tolerance  Patient tolerated treatment well    Behavior During Therapy  [facility] for tasks assessed/performed       Past Medical History:  Diagnosis Date  . Arthritis   . Hypertension     Past Surgical History:  Procedure Laterality Date  . KNEE CLOSED REDUCTION Left 10/13/2015   Procedure: LEFT CLOSED MANIPULATION KNEE;  Surgeon: Gaynelle Arabian, MD;  [facility]: [facility];  Service: Orthopedics;  Laterality: Left;  . TOTAL KNEE ARTHROPLASTY Left 07/28/2015   Procedure: LEFT TOTAL KNEE ARTHROPLASTY;  Surgeon: Gaynelle Arabian, MD;  Location: [facility];  Service: Orthopedics;  Laterality: Left;  . TOTAL KNEE ARTHROPLASTY Right 08/06/2019   Procedure: TOTAL KNEE ARTHROPLASTY;  Surgeon: Gaynelle Arabian, MD;  Location: [facility];  Service: Orthopedics;  Laterality: Right;  36mn    There were no vitals filed for this visit.  Subjective Assessment - 09/10/19 0935    Subjective  "Good"    Pertinent History  left TKR 2016    Limitations  Standing;Lifting;Walking;House hold activities    Patient Stated Goals  have good motion, less pain and walk better    Currently in Pain?  No/denies    Pain Orientation  Right                       [facility] Adult PT Treatment/Exercise - 09/10/19 0001      Ambulation/Gait   Stairs  Yes    Stairs Assistance  5: Supervision    Stair Management Technique  No rails;One rail Right;Two rails;Alternating pattern    Number of Stairs  72    Height of  Stairs  6    Gait Comments  3 flights, LL appears stiff when decending stairs.       Knee/Hip Exercises: Aerobic   Elliptical  I 10 R 5 28f/2bckwd    Recumbent Bike  L0 x 4 min       Knee/Hip Exercises: Machines for Strengthening   Cybex Knee Extension  RLE 10# 2x15    Cybex Knee Flexion  RLE 25# 2x15    Cybex Leg Press  60lb bilat Level 8 2x10,, 60lb heel raises 2x15,  20lb RLE       Modalities   Modalities  Vasopneumatic      Vasopneumatic   Number Minutes Vasopneumatic   10 minutes    Vasopnuematic Location   Knee    Vasopneumatic Pressure  Medium    Vasopneumatic Temperature   36      Manual Therapy   Manual Therapy  Joint mobilization;Passive ROM    Joint Mobilization  Anterior posterior 1-2               PT Short Term Goals - 08/15/19 1032      PT SHORT TERM GOAL #1   Title  independent with initial HEP    Status  Achieved  PT Long Term Goals - 09/07/19 0844      PT LONG TERM GOAL #1   Title  increase AROM of the right knee to 5-115 degrees flexion    Status  On-going      PT LONG TERM GOAL #2   Title  decrease pain 50%    Status  Achieved      PT LONG TERM GOAL #3   Title  walk community distances without assistive device and minimal deviation    Status  Partially Met      PT LONG TERM GOAL #4   Title  go up and down stairs reciprocally    Status  On-going            Plan - 09/10/19 1020    Clinical Impression Statement  Pt ws able to progress to some stair negotiation. R quad was weak with eccentric load, cues not to circumduct RLE with ascending stairs. Pt gives good effort with all interventions. R knee still is lack ing some extension. Cues needed throughout to flex R knee and hip with gait.    Stability/Clinical Decision Making  Evolving/Moderate complexity    Rehab Potential  Good    PT Frequency  3x / week    PT Duration  8 weeks    PT Treatment/Interventions  ADLs/Self Care Home Management;Cryotherapy;Electrical  Stimulation;Therapeutic activities;Functional mobility training;Stair training;Gait training;Therapeutic exercise;Balance training;Neuromuscular re-education;Patient/family education;Manual techniques;Vasopneumatic Device    PT Next Visit Plan  working on the ROM and gait       Patient will benefit from skilled therapeutic intervention in order to improve the following deficits and impairments:  Abnormal gait, Pain, Impaired tone, Decreased scar mobility, Decreased mobility, Cardiopulmonary status limiting activity, Decreased activity tolerance, Decreased endurance, Decreased range of motion, Decreased strength, Impaired flexibility, Increased edema, Difficulty walking, Decreased balance  Visit Diagnosis: Stiffness of right knee, not elsewhere classified  Acute pain of right knee  Localized edema     Problem List Patient Active Problem List   Diagnosis Date Noted  . Osteoarthritis of right knee 08/06/2019  . Postoperative stiffness of total knee replacement ([facility]) 10/12/2015  . OA (osteoarthritis) of knee 07/28/2015    Scot Jun, PTA 09/10/2019, 10:25 AM  [place], [place], [address] Phone: [phone]   Fax:  [phone]  Name: Tim [name] MRN: [id number] Date of Birth: 1956-01-11

## 2019-09-12 ENCOUNTER — Ambulatory Visit: Payer: Commercial Managed Care - PPO | Admitting: Physical Therapy

## 2019-09-12 ENCOUNTER — Other Ambulatory Visit: Payer: Self-pay

## 2019-09-12 ENCOUNTER — Encounter: Payer: Self-pay | Admitting: Physical Therapy

## 2019-09-12 DIAGNOSIS — M25661 Stiffness of right knee, not elsewhere classified: Secondary | ICD-10-CM

## 2019-09-12 DIAGNOSIS — M25561 Pain in right knee: Secondary | ICD-10-CM

## 2019-09-12 DIAGNOSIS — R600 Localized edema: Secondary | ICD-10-CM

## 2019-09-12 DIAGNOSIS — R262 Difficulty in walking, not elsewhere classified: Secondary | ICD-10-CM

## 2019-09-12 NOTE — Therapy (Signed)
[place], [place], [address] Phone: [phone]   Fax:  [phone]  Physical Therapy Treatment  Patient Details  Name: Tim [name] MRN: [id number] Date of Birth: 1956/09/02 Referring Provider (PT): Aluisio   Encounter Date: 09/12/2019  PT End of Session - 09/12/19 0930    Visit Number  14    Number of Visits  60    Date for PT Re-Evaluation  10/10/19    PT Start Time  0845    PT Stop Time  0939    PT Time Calculation (min)  54 min    Activity Tolerance  Patient tolerated treatment well    Behavior During Therapy  [facility] for tasks assessed/performed       Past Medical History:  Diagnosis Date  . Arthritis   . Hypertension     Past Surgical History:  Procedure Laterality Date  . KNEE CLOSED REDUCTION Left 10/13/2015   Procedure: LEFT CLOSED MANIPULATION KNEE;  Surgeon: Gaynelle Arabian, MD;  Location: [facility];  Service: Orthopedics;  Laterality: Left;  . TOTAL KNEE ARTHROPLASTY Left 07/28/2015   Procedure: LEFT TOTAL KNEE ARTHROPLASTY;  Surgeon: Gaynelle Arabian, MD;  [facility]: [facility];  Service: Orthopedics;  Laterality: Left;  . TOTAL KNEE ARTHROPLASTY Right 08/06/2019   Procedure: TOTAL KNEE ARTHROPLASTY;  Surgeon: Gaynelle Arabian, MD;  Location: [facility];  Service: Orthopedics;  Laterality: Right;  2mn    There were no vitals filed for this visit.  Subjective Assessment - 09/12/19 0849    Subjective  "Good"    Pertinent History  left TKE 2016    Limitations  Standing;Lifting;Walking;House hold activities    Patient Stated Goals  have good motion, less pain and walk better    Currently in Pain?  No/denies                       [facility]19 0001      Ambulation/Gait   Stairs  Yes    Stairs Assistance  5: Supervision    Stair Management Technique  No rails;One rail Right;Two rails;Alternating pattern    Number of Stairs  72    Height of  Stairs  6    Gait Comments  3 flights, LL appears stiff when decending stairs.       Knee/Hip Exercises: Aerobic   Elliptical  I 10 R 5 260f/2bckwd    Recumbent Bike  L0 x 4 min       Knee/Hip Exercises: Machines for Strengthening   Cybex Knee Extension  RLE 10# 2x15    Cybex Knee Flexion  RLE 25# 2x15      Knee/Hip Exercises: Standing   Walking with Sports Cord  50lb 4 way x 3 each     Other Standing Knee Exercises  Sit to stand from blue chair No UE, blocking RLE from coming forward 2x10       Modalities   Modalities  Vasopneumatic      Vasopneumatic   Number Minutes Vasopneumatic   10 minutes    Vasopnuematic Location   Knee    Vasopneumatic Pressure  Medium    Vasopneumatic Temperature   36      Manual Therapy   Manual Therapy  Joint mobilization;Passive ROM    Passive ROM  R knee extension               PT Short Term Goals - 08/15/19 1032  PT SHORT TERM GOAL #1   Title  independent with initial HEP    Status  Achieved        PT Long Term Goals - 09/07/19 0844      PT LONG TERM GOAL #1   Title  increase AROM of the right knee to 5-115 degrees flexion    Status  On-going      PT LONG TERM GOAL #2   Title  decrease pain 50%    Status  Achieved      PT LONG TERM GOAL #3   Title  walk community distances without assistive device and minimal deviation    Status  Partially Met      PT LONG TERM GOAL #4   Title  go up and down stairs reciprocally    Status  On-going            Plan - 09/12/19 0930    Clinical Impression Statement  Good carry over from last treatment with stair negotiation. Some eccentric load weakness and noted with descend stairs that's improved and he progressed. Cues throughout treatment to promoted TKE with RLE.    Stability/Clinical Decision Making  Evolving/Moderate complexity    Rehab Potential  Good    PT Frequency  3x / week    PT Duration  8 weeks    PT Treatment/Interventions  ADLs/Self Care Home  Management;Cryotherapy;Electrical Stimulation;Therapeutic activities;Functional mobility training;Stair training;Gait training;Therapeutic exercise;Balance training;Neuromuscular re-education;Patient/family education;Manual techniques;Vasopneumatic Device    PT Next Visit Plan  working on the ROM and gait       Patient will benefit from skilled therapeutic intervention in order to improve the following deficits and impairments:  Abnormal gait, Pain, Impaired tone, Decreased scar mobility, Decreased mobility, Cardiopulmonary status limiting activity, Decreased activity tolerance, Decreased endurance, Decreased range of motion, Decreased strength, Impaired flexibility, Increased edema, Difficulty walking, Decreased balance  Visit Diagnosis: Acute pain of right knee  Stiffness of right knee, not elsewhere classified  Localized edema  Difficulty in walking, not elsewhere classified     Problem List Patient Active Problem List   Diagnosis Date Noted  . Osteoarthritis of right knee 08/06/2019  . Postoperative stiffness of total knee replacement ([place]) 10/12/2015  . OA (osteoarthritis) of knee 07/28/2015    Scot Jun, PTA 09/12/2019, 9:33 AM  [place], [place], [address] Phone: [phone]   Fax:  [phone]  Name: Tim [name] MRN: [id number] Date of Birth: 17-Oct-1956

## 2019-09-14 ENCOUNTER — Ambulatory Visit: Payer: Commercial Managed Care - PPO | Admitting: Physical Therapy

## 2019-09-14 ENCOUNTER — Other Ambulatory Visit: Payer: Self-pay

## 2019-09-14 ENCOUNTER — Encounter: Payer: Self-pay | Admitting: Physical Therapy

## 2019-09-14 DIAGNOSIS — M25661 Stiffness of right knee, not elsewhere classified: Secondary | ICD-10-CM

## 2019-09-14 DIAGNOSIS — R600 Localized edema: Secondary | ICD-10-CM

## 2019-09-14 DIAGNOSIS — M25561 Pain in right knee: Secondary | ICD-10-CM | POA: Diagnosis not present

## 2019-09-14 DIAGNOSIS — R262 Difficulty in walking, not elsewhere classified: Secondary | ICD-10-CM

## 2019-09-14 NOTE — Therapy (Signed)
[place], [place], [address] Phone: [phone]   Fax:  [phone]  Physical Therapy Treatment  Patient Details  Name: Tim [name] MRN: [id number] Date of Birth: [birth year] Referring Provider (PT): Aluisio   Encounter Date: 09/14/2019  PT End of Session - 09/14/19 0931    Visit Number  15    Date for PT Re-Evaluation  10/10/19    PT Start Time  0845    PT Stop Time  0940    PT Time Calculation (min)  55 min    Activity Tolerance  Patient tolerated treatment well    Behavior During Therapy  [facility] for tasks assessed/performed       Past Medical History:  Diagnosis Date  . Arthritis   . Hypertension     Past Surgical History:  Procedure Laterality Date  . KNEE CLOSED REDUCTION Left 10/13/2015   Procedure: LEFT CLOSED MANIPULATION KNEE;  Surgeon: Gaynelle Arabian, MD;  [facility]: [facility];  Service: Orthopedics;  Laterality: Left;  . TOTAL KNEE ARTHROPLASTY Left 07/28/2015   Procedure: LEFT TOTAL KNEE ARTHROPLASTY;  Surgeon: Gaynelle Arabian, MD;  [facility];  Service: Orthopedics;  Laterality: Left;  . TOTAL KNEE ARTHROPLASTY Right 08/06/2019   Procedure: TOTAL KNEE ARTHROPLASTY;  Surgeon: Gaynelle Arabian, MD;  [facility]: [facility];  Service: Orthopedics;  Laterality: Right;  4mn    There were no vitals filed for this visit.  Subjective Assessment - 09/14/19 0852    Subjective  "good"    Pertinent History  left TKR 2016    Limitations  Standing;Lifting;Walking;House hold activities    Currently in Pain?  No/denies                       [facility]      Knee/Hip Exercises: Aerobic   Elliptical  I 10 R 5 2105f/2bckwd    Recumbent Bike  L0 x 4 min       Knee/Hip Exercises: Machines for Strengthening   Cybex Knee Extension  RLE 15# 2x10    Cybex Knee Flexion  RLE 25# 2x15    Cybex Leg Press  80lb bilat Level 8 2x15, 20lb RLE       Knee/Hip Exercises: Standing   Forward Step Up  Right;2 sets;10 reps;Hand Hold: 0;Step Height: 8"   LLE hip flex    Other Standing Knee Exercises  Sit to stand from blue chair No UE, blocking RLE from coming forward 2x10       Modalities   Modalities  Vasopneumatic      Vasopneumatic   Number Minutes Vasopneumatic   10 minutes    Vasopnuematic Location   Knee    Vasopneumatic Pressure  Medium    Vasopneumatic Temperature   36      Manual Therapy   Manual Therapy  Joint mobilization;Passive ROM    Joint Mobilization  Anterior posterior 1-2    Passive ROM  R knee extension               PT Short Term Goals - 08/15/19 1032      PT SHORT TERM GOAL #1   Title  independent with initial HEP    Status  Achieved        PT Long Term Goals - 09/07/19 0844      PT LONG TERM GOAL #1   Title  increase AROM of the right knee to 5-115  degrees flexion    Status  On-going      PT LONG TERM GOAL #2   Title  decrease pain 50%    Status  Achieved      PT LONG TERM GOAL #3   Title  walk community distances without assistive device and minimal deviation    Status  Partially Met      PT LONG TERM GOAL #4   Title  go up and down stairs reciprocally    Status  On-going            Plan - 09/14/19 0932    Clinical Impression Statement  pt continues to do well and give good effort. Progressive lowering utilized on leg press to create more knee flexion. Cues to for TKE of RLE with step ups. Tactile cues needed to keep RLE back with sit to stands. MT heavily focuses on extension    Stability/Clinical Decision Making  Evolving/Moderate complexity    Rehab Potential  Good    PT Frequency  3x / week    PT Duration  8 weeks    PT Treatment/Interventions  ADLs/Self Care Home Management;Cryotherapy;Electrical Stimulation;Therapeutic activities;Functional mobility training;Stair training;Gait training;Therapeutic exercise;Balance training;Neuromuscular re-education;Patient/family  education;Manual techniques;Vasopneumatic Device    PT Next Visit Plan  working on the ROM and gait       Patient will benefit from skilled therapeutic intervention in order to improve the following deficits and impairments:  Abnormal gait, Pain, Impaired tone, Decreased scar mobility, Decreased mobility, Cardiopulmonary status limiting activity, Decreased activity tolerance, Decreased endurance, Decreased range of motion, Decreased strength, Impaired flexibility, Increased edema, Difficulty walking, Decreased balance  Visit Diagnosis: Acute pain of right knee  Stiffness of right knee, not elsewhere classified  Localized edema  Difficulty in walking, not elsewhere classified     Problem List Patient Active Problem List   Diagnosis Date Noted  . Osteoarthritis of right knee 08/06/2019  . Postoperative stiffness of total knee replacement ([facility]) 10/12/2015  . OA (osteoarthritis) of knee 07/28/2015    Scot Jun, PTA 09/14/2019, 9:34 AM  [place], [place], [address] Phone: [phone]   Fax:  [phone]  Name: Tim [name] MRN: [id number] Date of Birth: [birth year]

## 2019-09-18 ENCOUNTER — Encounter: Payer: Self-pay | Admitting: Physical Therapy

## 2019-09-18 ENCOUNTER — Ambulatory Visit: Payer: Commercial Managed Care - PPO | Admitting: Physical Therapy

## 2019-09-18 ENCOUNTER — Other Ambulatory Visit: Payer: Self-pay

## 2019-09-18 DIAGNOSIS — M25561 Pain in right knee: Secondary | ICD-10-CM

## 2019-09-18 DIAGNOSIS — R600 Localized edema: Secondary | ICD-10-CM

## 2019-09-18 DIAGNOSIS — M25661 Stiffness of right knee, not elsewhere classified: Secondary | ICD-10-CM

## 2019-09-18 DIAGNOSIS — R262 Difficulty in walking, not elsewhere classified: Secondary | ICD-10-CM

## 2019-09-18 NOTE — Therapy (Signed)
[facility] [address] [place], [place], [address] Phone: [phone]   Fax:  [phone]  Physical Therapy Treatment  Patient Details  Name: Tim [name] MRN: [id number] Date of Birth: 02/29/1956 Referring Provider (PT): Aluisio   Encounter Date: 09/18/2019  PT End of Session - 09/18/19 0842    Visit Number  16    Date for PT Re-Evaluation  10/10/19    PT Start Time  0800    PT Stop Time  0852    PT Time Calculation (min)  52 min    Activity Tolerance  Patient tolerated treatment well    Behavior During Therapy  WFL for tasks assessed/performed       Past Medical History:  Diagnosis Date  . Arthritis   . Hypertension     Past Surgical History:  Procedure Laterality Date  . KNEE CLOSED REDUCTION Left 10/13/2015   Procedure: LEFT CLOSED MANIPULATION KNEE;  Surgeon: Frank Aluisio, MD;  Location: [facility];  Service: Orthopedics;  Laterality: Left;  . TOTAL KNEE ARTHROPLASTY Left 07/28/2015   Procedure: LEFT TOTAL KNEE ARTHROPLASTY;  Surgeon: Frank Aluisio, MD;  Location: [facility];  Service: Orthopedics;  Laterality: Left;  . TOTAL KNEE ARTHROPLASTY Right 08/06/2019   Procedure: TOTAL KNEE ARTHROPLASTY;  Surgeon: Aluisio, Frank, MD;  Location: [facility];  Service: Orthopedics;  Laterality: Right;  50min    There were no vitals filed for this visit.  Subjective Assessment - 09/18/19 0801    Subjective  "Good" Not as stiff    Currently in Pain?  No/denies                       [facility] Adult PT Treatment/Exercise - 09/18/19 0001      Ambulation/Gait   Stairs  Yes    Stairs Assistance  6: Modified independent (Device/Increase time);5: Supervision    Stair Management Technique  No rails;Alternating pattern    Number of Stairs  72    Gait Comments  3 flights, LLE appears stiff when decending stairs.       Knee/Hip Exercises: Aerobic   Elliptical  I 13 R 7 2fwd/2bckwd    Recumbent Bike  L0 x 4 min        Knee/Hip Exercises: Machines for Strengthening   Cybex Knee Extension  RLE 15# 2x15    Cybex Leg Press  80lb bilat Level 7 2x15, 20lb RLE       Knee/Hip Exercises: Standing   Lateral Step Up  Right;2 sets;10 reps;Hand Hold: 0;Step Height: 8"    Other Standing Knee Exercises  Sit to stand from blue chair No UE, blocking RLE from coming forward 2x10  holding yellow ball       Modalities   Modalities  Vasopneumatic      Vasopneumatic   Number Minutes Vasopneumatic   10 minutes    Vasopnuematic Location   Knee    Vasopneumatic Pressure  Medium    Vasopneumatic Temperature   36      Manual Therapy   Manual Therapy  Joint mobilization;Passive ROM    Joint Mobilization  patella mobs    Passive ROM  R knee extension               PT Short Term Goals - 08/15/19 1032      PT SHORT TERM GOAL #1   Title  independent with initial HEP    Status  Achieved          PT Long Term Goals - 09/18/19 9030      PT LONG TERM GOAL #1   Title  increase AROM of the right knee to 5-115 degrees flexion    Status  On-going      PT LONG TERM GOAL #2   Title  decrease pain 50%    Status  Achieved      PT LONG TERM GOAL #3   Title  walk community distances without assistive device and minimal deviation    Status  Partially Met      PT LONG TERM GOAL #4   Title  go up and down stairs reciprocally    Status  Partially Met            Plan - 09/18/19 0843    Clinical Impression Statement  Pt is progressing towards goals. Good carryover with stair negotiation, some R quad weakness noted with eccentric loads. Some compensation needed with lateral step ups. Some tightness and guarding with MT.    Stability/Clinical Decision Making  Evolving/Moderate complexity    Rehab Potential  Good    PT Frequency  3x / week    PT Duration  8 weeks    PT Treatment/Interventions  ADLs/Self Care Home Management;Cryotherapy;Electrical Stimulation;Therapeutic activities;Functional mobility  training;Stair training;Gait training;Therapeutic exercise;Balance training;Neuromuscular re-education;Patient/family education;Manual techniques;Vasopneumatic Device    PT Next Visit Plan  working on the ROM and gait       Patient will benefit from skilled therapeutic intervention in order to improve the following deficits and impairments:  Abnormal gait, Pain, Impaired tone, Decreased scar mobility, Decreased mobility, Cardiopulmonary status limiting activity, Decreased activity tolerance, Decreased endurance, Decreased range of motion, Decreased strength, Impaired flexibility, Increased edema, Difficulty walking, Decreased balance  Visit Diagnosis: Localized edema  Difficulty in walking, not elsewhere classified  Stiffness of right knee, not elsewhere classified  Acute pain of right knee     Problem List Patient Active Problem List   Diagnosis Date Noted  . Osteoarthritis of right knee 08/06/2019  . Postoperative stiffness of total knee replacement ([place]) 10/12/2015  . OA (osteoarthritis) of knee 07/28/2015    Scot Jun, PTA 09/18/2019, 8:50 AM  [place], [place], [address] Phone: [phone]   Fax:  [phone]  Name: Tim [name] MRN: [id number] Date of Birth: 10-Nov-1956

## 2019-09-19 ENCOUNTER — Encounter: Payer: Self-pay | Admitting: Physical Therapy

## 2019-09-19 ENCOUNTER — Ambulatory Visit: Payer: Commercial Managed Care - PPO | Admitting: Physical Therapy

## 2019-09-19 DIAGNOSIS — M25561 Pain in right knee: Secondary | ICD-10-CM | POA: Diagnosis not present

## 2019-09-19 DIAGNOSIS — R262 Difficulty in walking, not elsewhere classified: Secondary | ICD-10-CM

## 2019-09-19 DIAGNOSIS — M25661 Stiffness of right knee, not elsewhere classified: Secondary | ICD-10-CM

## 2019-09-19 DIAGNOSIS — R600 Localized edema: Secondary | ICD-10-CM

## 2019-09-19 NOTE — Therapy (Signed)
[place], [place], [address] Phone: [phone]   Fax:  [phone]  Physical Therapy Treatment  Patient Details  Name: Tim [name] MRN: [id number] Date of Birth: 1956-11-13 Referring Provider (PT): Aluisio   Encounter Date: 09/19/2019  PT End of Session - 09/19/19 0930    Visit Number  17    Date for PT Re-Evaluation  10/10/19    PT Start Time  0850    PT Stop Time  0940    PT Time Calculation (min)  50 min    Activity Tolerance  Patient tolerated treatment well    Behavior During Therapy  [facility] for tasks assessed/performed       Past Medical History:  Diagnosis Date  . Arthritis   . Hypertension     Past Surgical History:  Procedure Laterality Date  . KNEE CLOSED REDUCTION Left 10/13/2015   Procedure: LEFT CLOSED MANIPULATION KNEE;  Surgeon: Gaynelle Arabian, MD;  [facility]: [facility];  Service: Orthopedics;  Laterality: Left;  . TOTAL KNEE ARTHROPLASTY Left 07/28/2015   Procedure: LEFT TOTAL KNEE ARTHROPLASTY;  Surgeon: Gaynelle Arabian, MD;  [facility];  Service: Orthopedics;  Laterality: Left;  . TOTAL KNEE ARTHROPLASTY Right 08/06/2019   Procedure: TOTAL KNEE ARTHROPLASTY;  Surgeon: Gaynelle Arabian, MD;  Location: [facility];  Service: Orthopedics;  Laterality: Right;  77mn    There were no vitals filed for this visit.  Subjective Assessment - 09/19/19 0854    Subjective  "Good, Good"    Currently in Pain?  No/denies         [facility] - 09/[facility]/19 0001      AROM   AROM Assessment Site  Knee    Right/Left Knee  Right    Right Knee Extension  20    Right Knee Flexion  105                   [facility] Adult PT Treatment/Exercise - 09/19/19 0001      Knee/Hip Exercises: Aerobic   Elliptical  I 13 R 7 236f/2bckwd    Recumbent Bike  L0 x 4 min       Knee/Hip Exercises: Machines for Strengthening   Cybex Knee Extension  RLE 15# 2x15    Cybex Knee Flexion  RLE 35#  2x10    Cybex Leg Press  Level 7 20lb RLE 2x15      Knee/Hip Exercises: Standing   Lateral Step Up  Right;2 sets;10 reps;Hand Hold: 0;Step Height: 8"    Forward Step Up  Right;10 reps;Hand Hold: 0;Step Height: 8"    Walking with Sports Cord  50lb 4 way x 3 each     Other Standing Knee Exercises  Sit to stand from blue chair No UE, blocking RLE from coming forward 2x10  holding yellow ball       Vasopneumatic   Number Minutes Vasopneumatic   10 minutes    Vasopnuematic Location   Knee    Vasopneumatic Pressure  Medium    Vasopneumatic Temperature   36      Manual Therapy   Passive ROM  R knee extension               PT Short Term Goals - 08/15/19 1032      PT SHORT TERM GOAL #1   Title  independent with initial HEP    Status  Achieved        PT Long Term  Goals - 09/18/19 0842      PT LONG TERM GOAL #1   Title  increase AROM of the right knee to 5-115 degrees flexion    Status  On-going      PT LONG TERM GOAL #2   Title  decrease pain 50%    Status  Achieved      PT LONG TERM GOAL #3   Title  walk community distances without assistive device and minimal deviation    Status  Partially Met      PT LONG TERM GOAL #4   Title  go up and down stairs reciprocally    Status  Partially Met            Plan - 09/19/19 0931    Clinical Impression Statement  Pt ~ 5 minutes late for today's treatment. Pt has progressed increasing his R knees AROM, but he is still lacking ~ 20 degrees of extension. Increase weight tolerated with SL HS curls.Pt did better relaxing with MT.    Stability/Clinical Decision Making  Evolving/Moderate complexity    Rehab Potential  Good    PT Frequency  3x / week    PT Duration  8 weeks    PT Treatment/Interventions  ADLs/Self Care Home Management;Cryotherapy;Electrical Stimulation;Therapeutic activities;Functional mobility training;Stair training;Gait training;Therapeutic exercise;Balance training;Neuromuscular re-education;Patient/family  education;Manual techniques;Vasopneumatic Device    PT Next Visit Plan  R knee ROM, gait , and functional strength       Patient will benefit from skilled therapeutic intervention in order to improve the following deficits and impairments:  Abnormal gait, Pain, Impaired tone, Decreased scar mobility, Decreased mobility, Cardiopulmonary status limiting activity, Decreased activity tolerance, Decreased endurance, Decreased range of motion, Decreased strength, Impaired flexibility, Increased edema, Difficulty walking, Decreased balance  Visit Diagnosis: Localized edema  Difficulty in walking, not elsewhere classified  Stiffness of right knee, not elsewhere classified  Acute pain of right knee     Problem List Patient Active Problem List   Diagnosis Date Noted  . Osteoarthritis of right knee 08/06/2019  . Postoperative stiffness of total knee replacement ([place]) 10/12/2015  . OA (osteoarthritis) of knee 07/28/2015    Scot Jun 09/19/2019, 9:44 AM  [place], [place], [address] Phone: [phone]   Fax:  [phone]  Name: Tim [name] MRN: [id number] Date of Birth: 09-Sep-1956

## 2019-09-21 ENCOUNTER — Ambulatory Visit: Payer: Commercial Managed Care - PPO | Attending: Student | Admitting: Physical Therapy

## 2019-09-21 ENCOUNTER — Encounter: Payer: Self-pay | Admitting: Physical Therapy

## 2019-09-21 ENCOUNTER — Other Ambulatory Visit: Payer: Self-pay

## 2019-09-21 DIAGNOSIS — M25661 Stiffness of right knee, not elsewhere classified: Secondary | ICD-10-CM | POA: Diagnosis present

## 2019-09-21 DIAGNOSIS — M25561 Pain in right knee: Secondary | ICD-10-CM | POA: Insufficient documentation

## 2019-09-21 DIAGNOSIS — R600 Localized edema: Secondary | ICD-10-CM | POA: Diagnosis present

## 2019-09-21 DIAGNOSIS — R262 Difficulty in walking, not elsewhere classified: Secondary | ICD-10-CM | POA: Insufficient documentation

## 2019-09-21 NOTE — Therapy (Signed)
[place], [place], [address] Phone: [phone]   Fax:  [phone]  Physical Therapy Treatment  Patient Details  Name: Tim [name] MRN: [id number] Date of Birth: July 14, 1956 Referring Provider (PT): Aluisio   Encounter Date: 09/21/2019  PT End of Session - 09/21/19 0841    Visit Number  18    Date for PT Re-Evaluation  10/10/19    PT Start Time  0758    PT Stop Time  0850    PT Time Calculation (min)  52 min    Activity Tolerance  Patient tolerated treatment well    Behavior During Therapy  [facility] for tasks assessed/performed       Past Medical History:  Diagnosis Date  . Arthritis   . Hypertension     Past Surgical History:  Procedure Laterality Date  . KNEE CLOSED REDUCTION Left 10/13/2015   Procedure: LEFT CLOSED MANIPULATION KNEE;  Surgeon: Gaynelle Arabian, MD;  Location: [facility];  Service: Orthopedics;  Laterality: Left;  . TOTAL KNEE ARTHROPLASTY Left 07/28/2015   Procedure: LEFT TOTAL KNEE ARTHROPLASTY;  Surgeon: Gaynelle Arabian, MD;  [facility];  Service: Orthopedics;  Laterality: Left;  . TOTAL KNEE ARTHROPLASTY Right 08/06/2019   Procedure: TOTAL KNEE ARTHROPLASTY;  Surgeon: Gaynelle Arabian, MD;  [facility];  Service: Orthopedics;  Laterality: Right;  61mn    There were no vitals filed for this visit.  Subjective Assessment - 09/21/19 0800    Subjective  "I feel all right"    Currently in Pain?  No/denies                       [facility] - [facility] 0001      Ambulation/Gait   Stairs  Yes    Stairs Assistance  6: Modified independent (Device/Increase time);5: Supervision    Stair Management Technique  No rails;Alternating pattern    Number of Stairs  72    Gait Comments  3 flights, LLE appears stiff when decending stairs. Some every other step on the way up, difficult for pt      Knee/Hip Exercises: Aerobic   Elliptical  I 13  R 7 244f/2bckwd    Recumbent Bike  L0 x 4 min       Knee/Hip Exercises: Machines for Strengthening   Cybex Knee Extension  RLE 15# 2x15    Cybex Leg Press  80lb bilat Level 7 2x15, 20lb RLE x15      Knee/Hip Exercises: Standing   Lateral Step Up  Right;2 sets;10 reps;Hand Hold: 0;Step Height: 8"   L hip flex at top   Walking with Sports Cord  50 lb with 8 inch step up x 5, 40ld side step with 8 in step x 5      Modalities   Modalities  Cryotherapy      Cryotherapy   Number Minutes Cryotherapy  10 Minutes    Cryotherapy Location  Knee    Type of Cryotherapy  Ice pack      Manual Therapy   Passive ROM  R knee extension               PT Short Term Goals - 08/15/19 1032      PT SHORT TERM GOAL #1   Title  independent with initial HEP    Status  Achieved        PT Long Term Goals - 09/18/19 081027  PT LONG TERM GOAL #1   Title  increase AROM of the right knee to 5-115 degrees flexion    Status  On-going      PT LONG TERM GOAL #2   Title  decrease pain 50%    Status  Achieved      PT LONG TERM GOAL #3   Title  walk community distances without assistive device and minimal deviation    Status  Partially Met      PT LONG TERM GOAL #4   Title  go up and down stairs reciprocally    Status  Partially Met            Plan - 09/21/19 0841    Clinical Impression Statement  Pt was able to progress to stair negotiation step over step every other step. He was able to complete but bas difficult stepping with RLE. Progressive lowering utilize on leg press abd he was able to go down to level 6. Good stability with resisted gait and step up forwards, some instability with sides steps.    Stability/Clinical Decision Making  Evolving/Moderate complexity    Rehab Potential  Good    PT Frequency  3x / week    PT Treatment/Interventions  ADLs/Self Care Home Management;Cryotherapy;Electrical Stimulation;Therapeutic activities;Functional mobility training;Stair  training;Gait training;Therapeutic exercise;Balance training;Neuromuscular re-education;Patient/family education;Manual techniques;Vasopneumatic Device    PT Next Visit Plan  R knee ROM, gait , and functional strenght       Patient will benefit from skilled therapeutic intervention in order to improve the following deficits and impairments:  Abnormal gait, Pain, Impaired tone, Decreased scar mobility, Decreased mobility, Cardiopulmonary status limiting activity, Decreased activity tolerance, Decreased endurance, Decreased range of motion, Decreased strength, Impaired flexibility, Increased edema, Difficulty walking, Decreased balance  Visit Diagnosis: Stiffness of right knee, not elsewhere classified  Difficulty in walking, not elsewhere classified  Localized edema     Problem List Patient Active Problem List   Diagnosis Date Noted  . Osteoarthritis of right knee 08/06/2019  . Postoperative stiffness of total knee replacement ([place]) 10/12/2015  . OA (osteoarthritis) of knee 07/28/2015    Scot Jun, PTA 09/21/2019, 8:45 AM  [place], [place], [address] Phone: [phone]   Fax:  [phone]  Name: Tim [name] MRN: [id number] Date of Birth: 04-01-1956

## 2019-09-24 ENCOUNTER — Other Ambulatory Visit: Payer: Self-pay

## 2019-09-24 ENCOUNTER — Encounter: Payer: Self-pay | Admitting: Physical Therapy

## 2019-09-24 ENCOUNTER — Ambulatory Visit: Payer: Commercial Managed Care - PPO | Admitting: Physical Therapy

## 2019-09-24 DIAGNOSIS — R600 Localized edema: Secondary | ICD-10-CM

## 2019-09-24 DIAGNOSIS — R262 Difficulty in walking, not elsewhere classified: Secondary | ICD-10-CM

## 2019-09-24 DIAGNOSIS — M25661 Stiffness of right knee, not elsewhere classified: Secondary | ICD-10-CM | POA: Diagnosis not present

## 2019-09-24 DIAGNOSIS — M25561 Pain in right knee: Secondary | ICD-10-CM

## 2019-09-24 NOTE — Therapy (Signed)
[facility] [address] [place], [place], [address] Phone: [phone]   Fax:  [phone]  Physical Therapy Treatment  Patient Details  Name: Harshil Cavallaro MRN: [id number] Date of Birth: Sep 09, 1956 Referring Provider (PT): Aluisio   Encounter Date: 09/24/2019  PT End of Session - 09/24/19 0925    Visit Number  19    Number of Visits  60    Date for PT Re-Evaluation  10/10/19    PT Start Time  0845    PT Stop Time  0945    PT Time Calculation (min)  60 min    Activity Tolerance  Patient tolerated treatment well    Behavior During Therapy  [facility] for tasks assessed/performed       Past Medical History:  Diagnosis Date  . Arthritis   . Hypertension     Past Surgical History:  Procedure Laterality Date  . KNEE CLOSED REDUCTION Left 10/13/2015   Procedure: LEFT CLOSED MANIPULATION KNEE;  Surgeon: Ollen Gross, MD;  [facility];  Service: Orthopedics;  Laterality: Left;  . TOTAL KNEE ARTHROPLASTY Left 07/28/2015   Procedure: LEFT TOTAL KNEE ARTHROPLASTY;  Surgeon: Ollen Gross, MD;  [facility];  Service: Orthopedics;  Laterality: Left;  . TOTAL KNEE ARTHROPLASTY Right 08/06/2019   Procedure: TOTAL KNEE ARTHROPLASTY;  Surgeon: Ollen Gross, MD;  [facility];  Service: Orthopedics;  Laterality: Right;     There were no vitals filed for this visit.  Subjective Assessment - 09/24/19 0855    Subjective  I am feeling a little better this AM    Currently in Pain?  No/denies                       [facility] Adult PT Treatment/Exercise - 09/24/19 0001      Ambulation/Gait   Gait Comments  stairs step over step and then lunge stretches      Knee/Hip Exercises: Aerobic   Elliptical  I 13 R 7 8fwd/2bckwd    Recumbent Bike  L0 x 6 min       Knee/Hip Exercises: Machines for Strengthening   Cybex Leg Press  40# working on going deep posistion #7 is difficult for him to get to, did some  single leg presses      Knee/Hip Exercises: Seated   Other Seated Knee/Hip Exercises  oin w/c pulling with right only then pulling with both to help with flexion, then pushing with both      Knee/Hip Exercises: Supine   Heel Prop for Knee Extension  3 minutes    Heel Prop for Knee Extension Weight (lbs)  17.5#    Heel Prop for Knee Extension Limitations  cues to not roll leg out      Vasopneumatic   Number Minutes Vasopneumatic   10 minutes    Vasopnuematic Location   Knee    Vasopneumatic Pressure  Medium    Vasopneumatic Temperature   36      Manual Therapy   Manual Therapy  Joint mobilization;Passive ROM    Passive ROM  PROM kn ee flexion and extension               PT Short Term Goals - 08/15/19 1032      PT SHORT TERM GOAL #1   Title  independent with initial HEP    Status  Achieved        PT Long Term Goals - 09/24/19 3244  PT LONG TERM GOAL #1   Title  increase AROM of the right knee to 5-115 degrees flexion    Status  On-going      PT LONG TERM GOAL #2   Title  decrease pain 50%    Status  Achieved            Plan - 09/24/19 0925    Clinical Impression Statement  Patient is still very stiff, he moves better at times, like on the stairs he was very stiff but when we broke it down he was able to move a little easier.  He c/o pain mostly posterior with all activities    PT Next Visit Plan  keep pushing ROM and functional    Consulted and Agree with Plan of Care  Patient       Patient will benefit from skilled therapeutic intervention in order to improve the following deficits and impairments:  Abnormal gait, Pain, Impaired tone, Decreased scar mobility, Decreased mobility, Cardiopulmonary status limiting activity, Decreased activity tolerance, Decreased endurance, Decreased range of motion, Decreased strength, Impaired flexibility, Increased edema, Difficulty walking, Decreased balance  Visit Diagnosis: Stiffness of right knee, not elsewhere  classified  Difficulty in walking, not elsewhere classified  Localized edema  Acute pain of right knee     Problem List Patient Active Problem List   Diagnosis Date Noted  . Osteoarthritis of right knee 08/06/2019  . Postoperative stiffness of total knee replacement ([place]) 10/12/2015  . OA (osteoarthritis) of knee 07/28/2015    Sumner Boast., PT 09/24/2019, 9:30 AM  [place], [place], [address] Phone: [phone]   Fax:  [phone]  Name: Erastus Bartolomei MRN: [id number] Date of Birth: August 26, 1956

## 2019-09-26 ENCOUNTER — Encounter: Payer: Self-pay | Admitting: Physical Therapy

## 2019-09-26 ENCOUNTER — Ambulatory Visit: Payer: Commercial Managed Care - PPO | Admitting: Physical Therapy

## 2019-09-26 ENCOUNTER — Other Ambulatory Visit: Payer: Self-pay

## 2019-09-26 DIAGNOSIS — M25661 Stiffness of right knee, not elsewhere classified: Secondary | ICD-10-CM | POA: Diagnosis not present

## 2019-09-26 DIAGNOSIS — M25561 Pain in right knee: Secondary | ICD-10-CM

## 2019-09-26 DIAGNOSIS — R600 Localized edema: Secondary | ICD-10-CM

## 2019-09-26 DIAGNOSIS — R262 Difficulty in walking, not elsewhere classified: Secondary | ICD-10-CM

## 2019-09-26 NOTE — Therapy (Signed)
[place], [place], [address] Phone: [phone]   Fax:  [phone]  Physical Therapy Treatment  Patient Details  Name: Tim [name] MRN: [id number] Date of Birth: 01-26-1956 Referring Provider (PT): Aluisio   Encounter Date: 09/26/2019  PT End of Session - 09/26/19 0926    Visit Number  20    Date for PT Re-Evaluation  10/10/19    PT Start Time  0850    PT Stop Time  0944    PT Time Calculation (min)  54 min    Activity Tolerance  Patient tolerated treatment well    Behavior During Therapy  [facility] for tasks assessed/performed       Past Medical History:  Diagnosis Date  . Arthritis   . Hypertension     Past Surgical History:  Procedure Laterality Date  . KNEE CLOSED REDUCTION Left 10/13/2015   Procedure: LEFT CLOSED MANIPULATION KNEE;  Surgeon: Gaynelle Arabian, MD;  Location: [facility];  Service: Orthopedics;  Laterality: Left;  . TOTAL KNEE ARTHROPLASTY Left 07/28/2015   Procedure: LEFT TOTAL KNEE ARTHROPLASTY;  Surgeon: Gaynelle Arabian, MD;  [facility];  Service: Orthopedics;  Laterality: Left;  . TOTAL KNEE ARTHROPLASTY Right 08/06/2019   Procedure: TOTAL KNEE ARTHROPLASTY;  Surgeon: Gaynelle Arabian, MD;  [facility];  Service: Orthopedics;  Laterality: Right;  46min    There were no vitals filed for this visit.  Subjective Assessment - 09/26/19 0852    Subjective  "Good" "Hurting after last time"    Pertinent History  left TKR 2016    Currently in Pain?  No/denies                       [facility] Adult PT Treatment/Exercise - 09/26/19 0001      Ambulation/Gait   Stairs  Yes    Stairs Assistance  6: Modified independent (Device/Increase time);5: Supervision    Stair Management Technique  No rails;Alternating pattern    Number of Stairs  72    Height of Stairs  6    Gait Comments  3 flights, LLE appears stiff when decending stairs. Some every other step on the way  up, difficult for pt      Exercises   Exercises  Knee/Hip      Knee/Hip Exercises: Aerobic   Elliptical  I 13 R 7 29fwd/2bckwd    Recumbent Bike  L0 x 4 min       Knee/Hip Exercises: Machines for Strengthening   Cybex Knee Extension  RLE 15# 2x15    Cybex Leg Press  60# working on going deep posistion #7  2x15       Knee/Hip Exercises: Standing   Lateral Step Up  Right;2 sets;10 reps;Hand Hold: 1;Hand Hold: 0;Step Height: 8"   LLE hip drive     Knee/Hip Exercises: Seated   Sit to Sand  2 sets;10 reps;without UE support   blue chair      Knee/Hip Exercises: Supine   Heel Prop for Knee Extension  3 minutes    Heel Prop for Knee Extension Weight (lbs)  10lb    Heel Prop for Knee Extension Limitations  cues to not roll leg out      Vasopneumatic   Number Minutes Vasopneumatic   10 minutes    Vasopnuematic Location   Knee    Vasopneumatic Pressure  Medium    Vasopneumatic Temperature   36  Manual Therapy   Manual Therapy  Joint mobilization;Passive ROM    Passive ROM  PROM kn ee flexion and extension               PT Short Term Goals - 08/15/19 1032      PT SHORT TERM GOAL #1   Title  independent with initial HEP    Status  Achieved        PT Long Term Goals - 09/24/19 0927      PT LONG TERM GOAL #1   Title  increase AROM of the right knee to 5-115 degrees flexion    Status  On-going      PT LONG TERM GOAL #2   Title  decrease pain 50%    Status  Achieved            Plan - 09/26/19 0927    Clinical Impression Statement  Pt continues to have difficulty with R knee extension. The first flight of stairs was difficult for pt, but improved with the next two flights. He did do better achieving level 7 on leg press. He reports that's LLLDS does help. Good effort with all interventions    Stability/Clinical Decision Making  Evolving/Moderate complexity    Rehab Potential  Good    PT Frequency  3x / week    PT Duration  8 weeks    PT  Treatment/Interventions  ADLs/Self Care Home Management;Cryotherapy;Electrical Stimulation;Therapeutic activities;Functional mobility training;Stair training;Gait training;Therapeutic exercise;Balance training;Neuromuscular re-education;Patient/family education;Manual techniques;Vasopneumatic Device    PT Next Visit Plan  keep pushing ROM and functional       Patient will benefit from skilled therapeutic intervention in order to improve the following deficits and impairments:  Abnormal gait, Pain, Impaired tone, Decreased scar mobility, Decreased mobility, Cardiopulmonary status limiting activity, Decreased activity tolerance, Decreased endurance, Decreased range of motion, Decreased strength, Impaired flexibility, Increased edema, Difficulty walking, Decreased balance  Visit Diagnosis: Stiffness of right knee, not elsewhere classified  Difficulty in walking, not elsewhere classified  Localized edema  Acute pain of right knee     Problem List Patient Active Problem List   Diagnosis Date Noted  . Osteoarthritis of right knee 08/06/2019  . Postoperative stiffness of total knee replacement (HCC) 10/12/2015  . OA (osteoarthritis) of knee 07/28/2015    Grayce Sessions, PTA 09/26/2019, 9:33 AM  [facility] [place] [address] [place], [place], [address] Phone: [phone]   Fax:  [phone]  Name: Ahmaud Duthie MRN: [id number] Date of Birth: 1956/08/04

## 2019-09-28 ENCOUNTER — Ambulatory Visit: Payer: Commercial Managed Care - PPO | Admitting: Physical Therapy

## 2019-09-28 ENCOUNTER — Other Ambulatory Visit: Payer: Self-pay

## 2019-09-28 ENCOUNTER — Encounter: Payer: Self-pay | Admitting: Physical Therapy

## 2019-09-28 DIAGNOSIS — M25661 Stiffness of right knee, not elsewhere classified: Secondary | ICD-10-CM

## 2019-09-28 DIAGNOSIS — M25561 Pain in right knee: Secondary | ICD-10-CM

## 2019-09-28 DIAGNOSIS — R600 Localized edema: Secondary | ICD-10-CM

## 2019-09-28 DIAGNOSIS — R262 Difficulty in walking, not elsewhere classified: Secondary | ICD-10-CM

## 2019-09-28 NOTE — Therapy (Signed)
[place], [place], [address] Phone: [phone]   Fax:  [phone]  Physical Therapy Treatment  Patient Details  Name: Tim [name] MRN: [id number] Date of Birth: [birth year] Referring Provider (PT): Aluisio   Encounter Date: 09/28/2019  PT End of Session - 09/28/19 0928    Visit Number  21    Number of Visits  60    Date for PT Re-Evaluation  10/10/19    PT Start Time  0845    PT Stop Time  0937    PT Time Calculation (min)  52 min    Activity Tolerance  Patient tolerated treatment well    Behavior During Therapy  [facility] for tasks assessed/performed       Past Medical History:  Diagnosis Date  . Arthritis   . Hypertension     Past Surgical History:  Procedure Laterality Date  . KNEE CLOSED REDUCTION Left 10/13/2015   Procedure: LEFT CLOSED MANIPULATION KNEE;  Surgeon: Gaynelle Arabian, MD;  [facility];  Service: Orthopedics;  Laterality: Left;  . TOTAL KNEE ARTHROPLASTY Left 07/28/2015   Procedure: LEFT TOTAL KNEE ARTHROPLASTY;  Surgeon: Gaynelle Arabian, MD;  [facility];  Service: Orthopedics;  Laterality: Left;  . TOTAL KNEE ARTHROPLASTY Right 08/06/2019   Procedure: TOTAL KNEE ARTHROPLASTY;  Surgeon: Gaynelle Arabian, MD;  [facility];  Service: Orthopedics;  Laterality: Right;  2min    There were no vitals filed for this visit.  Subjective Assessment - 09/28/19 0850    Subjective  "Had the best one last time"    Currently in Pain?  No/denies         [facility] PT Assessment - 09/28/19 0001      AROM   Right Knee Extension  16    Right Knee Flexion  103      Ambulation/Gait   Stairs  Yes    Stairs Assistance  6: Modified independent (Device/Increase time);5: Supervision    Stair Management Technique  No rails;Alternating pattern    Number of Stairs  72    Height of Stairs  6    Gait Comments  3 flights, LLE appears stiff when decending stairs. Some every other step on  the way up, difficult for pt                   [facility] Adult PT Treatment/Exercise - 09/28/19 0001      Knee/Hip Exercises: Aerobic   Elliptical  I 13 R 7 30fwd/2bckwd    Recumbent Bike  L0 x 4 min       Knee/Hip Exercises: Machines for Strengthening   Cybex Knee Extension  RLE 20# 2x10    Cybex Leg Press  60# working on going deep posistion #7  2x15       Knee/Hip Exercises: Standing   Lateral Step Up  Right;2 sets;10 reps;Hand Hold: 1;Hand Hold: 0;Step Height: 8"   R hip flex      Cryotherapy   Number Minutes Cryotherapy  10 Minutes    Cryotherapy Location  Knee    Type of Cryotherapy  Ice pack      Manual Therapy   Manual Therapy  Joint mobilization;Passive ROM    Joint Mobilization  patella mobs    Passive ROM  PROM kn ee flexion and extension               PT Short Term Goals - 08/15/19 1032  PT SHORT TERM GOAL #1   Title  independent with initial HEP    Status  Achieved        PT Long Term Goals - 09/24/19 0927      PT LONG TERM GOAL #1   Title  increase AROM of the right knee to 5-115 degrees flexion    Status  On-going      PT LONG TERM GOAL #2   Title  decrease pain 50%    Status  Achieved            Plan - 09/28/19 0929    Clinical Impression Statement  Small increase in R knee extension but he is still limited. Good push off with going up stairs every other step. No reports of pain. Pt gives good effort throughout. Postural cues needed with lateral step up    Stability/Clinical Decision Making  Evolving/Moderate complexity    Rehab Potential  Good    PT Frequency  3x / week    PT Duration  8 weeks    PT Treatment/Interventions  ADLs/Self Care Home Management;Cryotherapy;Electrical Stimulation;Therapeutic activities;Functional mobility training;Stair training;Gait training;Therapeutic exercise;Balance training;Neuromuscular re-education;Patient/family education;Manual techniques;Vasopneumatic Device    PT Next Visit Plan   keep pushing ROM and functional       Patient will benefit from skilled therapeutic intervention in order to improve the following deficits and impairments:  Abnormal gait, Pain, Impaired tone, Decreased scar mobility, Decreased mobility, Cardiopulmonary status limiting activity, Decreased activity tolerance, Decreased endurance, Decreased range of motion, Decreased strength, Impaired flexibility, Increased edema, Difficulty walking, Decreased balance  Visit Diagnosis: Stiffness of right knee, not elsewhere classified  Difficulty in walking, not elsewhere classified  Localized edema  Acute pain of right knee     Problem List Patient Active Problem List   Diagnosis Date Noted  . Osteoarthritis of right knee 08/06/2019  . Postoperative stiffness of total knee replacement (HCC) 10/12/2015  . OA (osteoarthritis) of knee 07/28/2015    Grayce Sessions, PTA 09/28/2019, 9:32 AM  [facility] [place] [address] [place], [place], [address] Phone: [phone]   Fax:  [phone]  Name: Tim [name] MRN: [id number] Date of Birth: [birth year]

## 2019-10-01 ENCOUNTER — Encounter: Payer: Self-pay | Admitting: Physical Therapy

## 2019-10-01 ENCOUNTER — Other Ambulatory Visit: Payer: Self-pay

## 2019-10-01 ENCOUNTER — Ambulatory Visit: Payer: Commercial Managed Care - PPO | Admitting: Physical Therapy

## 2019-10-01 DIAGNOSIS — M25661 Stiffness of right knee, not elsewhere classified: Secondary | ICD-10-CM

## 2019-10-01 DIAGNOSIS — R262 Difficulty in walking, not elsewhere classified: Secondary | ICD-10-CM

## 2019-10-01 DIAGNOSIS — M25561 Pain in right knee: Secondary | ICD-10-CM

## 2019-10-01 DIAGNOSIS — R600 Localized edema: Secondary | ICD-10-CM

## 2019-10-01 NOTE — Therapy (Signed)
[place], [place], [address] Phone: [phone]   Fax:  [phone]  Physical Therapy Treatment  Patient Details  Name: Tim [name] MRN: [id number] Date of Birth: 1956/04/24 Referring Provider (PT): Aluisio   Encounter Date: 10/01/2019    Past Medical History:  Diagnosis Date  . Arthritis   . Hypertension     Past Surgical History:  Procedure Laterality Date  . KNEE CLOSED REDUCTION Left 10/13/2015   Procedure: LEFT CLOSED MANIPULATION KNEE;  Surgeon: Gaynelle Arabian, MD;  [facility];  Service: Orthopedics;  Laterality: Left;  . TOTAL KNEE ARTHROPLASTY Left 07/28/2015   Procedure: LEFT TOTAL KNEE ARTHROPLASTY;  Surgeon: Gaynelle Arabian, MD;  [facility];  Service: Orthopedics;  Laterality: Left;  . TOTAL KNEE ARTHROPLASTY Right 08/06/2019   Procedure: TOTAL KNEE ARTHROPLASTY;  Surgeon: Gaynelle Arabian, MD;  [facility];  Service: Orthopedics;  Laterality: Right;  30min    There were no vitals filed for this visit.  Subjective Assessment - 10/01/19 0848    Subjective  pt denies any pain. "doing pretty good"    Currently in Pain?  No/denies                       [facility] Adult PT Treatment/Exercise - 10/01/19 0001      Knee/Hip Exercises: Aerobic   Elliptical  I 13 R 7 66fwd/3bckwd      Knee/Hip Exercises: Machines for Strengthening   Cybex Leg Press  60# working on going deep posistion #6  2x15, RLE 20lb TKE  2x10        Knee/Hip Exercises: Standing   Heel Raises  Both;2 sets;15 reps;2 seconds    Other Standing Knee Exercises  Controlled descents 6in 2x10 some UE assist      Knee/Hip Exercises: Seated   Sit to Sand  2 sets;10 reps;without UE support   from mat table      Vasopneumatic   Number Minutes Vasopneumatic   10 minutes    Vasopnuematic Location   Knee    Vasopneumatic Pressure  Medium    Vasopneumatic Temperature   36      Manual Therapy   Manual Therapy  Joint mobilization;Passive ROM    Joint Mobilization  patella mobs    Passive ROM  PROM kn ee flexion and extension               PT Short Term Goals - 08/15/19 1032      PT SHORT TERM GOAL #1   Title  independent with initial HEP    Status  Achieved        PT Long Term Goals - 09/24/19 0927      PT LONG TERM GOAL #1   Title  increase AROM of the right knee to 5-115 degrees flexion    Status  On-going      PT LONG TERM GOAL #2   Title  decrease pain 50%    Status  Achieved            Plan - 10/01/19 0926    Clinical Impression Statement  Main limitation is R knee extension but it is improving. Progressed to some controlled decent's but had some weakness. Lower level on leg press achieved today increasing R knee flexion.    Rehab Potential  Good    PT Frequency  3x / week    PT Treatment/Interventions  ADLs/Self Care Home Management;Cryotherapy;Electrical Stimulation;Therapeutic  activities;Functional mobility training;Stair training;Gait training;Therapeutic exercise;Balance training;Neuromuscular re-education;Patient/family education;Manual techniques;Vasopneumatic Device    PT Next Visit Plan  keep pushing ROM and functional       Patient will benefit from skilled therapeutic intervention in order to improve the following deficits and impairments:  Abnormal gait, Pain, Impaired tone, Decreased scar mobility, Decreased mobility, Cardiopulmonary status limiting activity, Decreased activity tolerance, Decreased endurance, Decreased range of motion, Decreased strength, Impaired flexibility, Increased edema, Difficulty walking, Decreased balance  Visit Diagnosis: Difficulty in walking, not elsewhere classified  Acute pain of right knee  Localized edema  Stiffness of right knee, not elsewhere classified     Problem List Patient Active Problem List   Diagnosis Date Noted  . Osteoarthritis of right knee 08/06/2019  . Postoperative stiffness  of total knee replacement (HCC) 10/12/2015  . OA (osteoarthritis) of knee 07/28/2015    Grayce Sessions 10/01/2019, 9:29 AM  [facility] [place] [address] [place], [place], [address] Phone: [phone]   Fax:  [phone]  Name: Tim [name] MRN: [id number] Date of Birth: December 14, 1956

## 2019-10-03 ENCOUNTER — Ambulatory Visit: Payer: Commercial Managed Care - PPO | Admitting: Physical Therapy

## 2019-10-03 ENCOUNTER — Other Ambulatory Visit: Payer: Self-pay

## 2019-10-03 DIAGNOSIS — M25661 Stiffness of right knee, not elsewhere classified: Secondary | ICD-10-CM | POA: Diagnosis not present

## 2019-10-03 DIAGNOSIS — R262 Difficulty in walking, not elsewhere classified: Secondary | ICD-10-CM

## 2019-10-03 NOTE — Therapy (Signed)
[facility] [place] [address] [place], [place], [address] Phone: [phone]   Fax:  [phone]  Physical Therapy Treatment  Patient Details  Name: Tim [name] MRN: [id number] Date of Birth: 22-Nov-1956 Referring Provider (PT): Aluisio   Encounter Date: 10/03/2019  PT End of Session - 10/03/19 0934    PT Start Time  0853    PT Stop Time  0938    PT Time Calculation (min)  45 min       Past Medical History:  Diagnosis Date  . Arthritis   . Hypertension     Past Surgical History:  Procedure Laterality Date  . KNEE CLOSED REDUCTION Left 10/13/2015   Procedure: LEFT CLOSED MANIPULATION KNEE;  Surgeon: Ollen Gross, MD;  [facility];  Service: Orthopedics;  Laterality: Left;  . TOTAL KNEE ARTHROPLASTY Left 07/28/2015   Procedure: LEFT TOTAL KNEE ARTHROPLASTY;  Surgeon: Ollen Gross, MD;  [facility];  Service: Orthopedics;  Laterality: Left;  . TOTAL KNEE ARTHROPLASTY Right 08/06/2019   Procedure: TOTAL KNEE ARTHROPLASTY;  Surgeon: Ollen Gross, MD;  [facility];  Service: Orthopedics;  Laterality: Right;     There were no vitals filed for this visit.  Subjective Assessment - 10/03/19 0853    Subjective  pt denies any pain. "leg is a little stiff"    Currently in Pain?  No/denies                       [facility] Adult PT Treatment/Exercise - 10/03/19 0001      Knee/Hip Exercises: Aerobic   Elliptical  I 13 R 7 69fwd/3bckwd    Recumbent Bike  L0 x 4 min       Knee/Hip Exercises: Machines for Strengthening   Cybex Leg Press  60# working on going deep posistion #6  2x15, RLE 20lb TKE  2x10        Knee/Hip Exercises: Standing   Walking with Sports Cord  50 lb x 5 in all directions      Cryotherapy   Number Minutes Cryotherapy  10 Minutes    Cryotherapy Location  Knee    Type of Cryotherapy  Ice pack      Manual Therapy   Passive ROM  PROM kn ee flexion and extension                PT Short Term Goals - 08/15/19 1032      PT SHORT TERM GOAL #1   Title  independent with initial HEP    Status  Achieved        PT Long Term Goals - 09/24/19 0927      PT LONG TERM GOAL #1   Title  increase AROM of the right knee to 5-115 degrees flexion    Status  On-going      PT LONG TERM GOAL #2   Title  decrease pain 50%    Status  Achieved            Plan - 10/03/19 0931    Clinical Impression Statement  pt arrived late to therapy. Pt handled treatment well as demonstrated by no increase in pain during interventions. Pt continutes to work toward increasing knee extension.    Rehab Potential  Good    PT Frequency  3x / week    PT Treatment/Interventions  ADLs/Self Care Home Management;Cryotherapy;Electrical Stimulation;Therapeutic activities;Functional mobility training;Stair training;Gait training;Therapeutic exercise;Balance training;Neuromuscular re-education;Patient/family education;Manual techniques;Vasopneumatic Device  PT Next Visit Plan  keep pushing ROM and functional       Patient will benefit from skilled therapeutic intervention in order to improve the following deficits and impairments:  Abnormal gait, Pain, Impaired tone, Decreased scar mobility, Decreased mobility, Cardiopulmonary status limiting activity, Decreased activity tolerance, Decreased endurance, Decreased range of motion, Decreased strength, Impaired flexibility, Increased edema, Difficulty walking, Decreased balance  Visit Diagnosis: Stiffness of right knee, not elsewhere classified  Difficulty in walking, not elsewhere classified     Problem List Patient Active Problem List   Diagnosis Date Noted  . Osteoarthritis of right knee 08/06/2019  . Postoperative stiffness of total knee replacement ([place]) 10/12/2015  . OA (osteoarthritis) of knee 07/28/2015    Barrett Henle, [place] 10/03/2019, 9:40 AM  [place], [place], [address] Phone: [phone]   Fax:  [phone]  Name: Tim [name] MRN: [id number] Date of Birth: 1956-05-15

## 2019-10-05 ENCOUNTER — Ambulatory Visit: Payer: Commercial Managed Care - PPO | Admitting: Physical Therapy

## 2019-10-05 ENCOUNTER — Encounter: Payer: Self-pay | Admitting: Physical Therapy

## 2019-10-05 ENCOUNTER — Other Ambulatory Visit: Payer: Self-pay

## 2019-10-05 DIAGNOSIS — M25561 Pain in right knee: Secondary | ICD-10-CM

## 2019-10-05 DIAGNOSIS — M25661 Stiffness of right knee, not elsewhere classified: Secondary | ICD-10-CM

## 2019-10-05 DIAGNOSIS — R262 Difficulty in walking, not elsewhere classified: Secondary | ICD-10-CM

## 2019-10-05 DIAGNOSIS — R600 Localized edema: Secondary | ICD-10-CM

## 2019-10-05 NOTE — Therapy (Signed)
[place], [place], [address] Phone: [phone]   Fax:  [phone]  Physical Therapy Treatment  Patient Details  Name: Tim [name] MRN: [id number] Date of Birth: [birth year] Referring Provider (PT): Aluisio   Encounter Date: 10/05/2019  PT End of Session - 10/05/19 0948    Visit Number  22    Number of Visits  60    Date for PT Re-Evaluation  10/10/19    PT Start Time  0845    PT Stop Time  0940    PT Time Calculation (min)  55 min    Activity Tolerance  Patient tolerated treatment well    Behavior During Therapy  [facility] for tasks assessed/performed       Past Medical History:  Diagnosis Date  . Arthritis   . Hypertension     Past Surgical History:  Procedure Laterality Date  . KNEE CLOSED REDUCTION Left 10/13/2015   Procedure: LEFT CLOSED MANIPULATION KNEE;  Surgeon: Gaynelle Arabian, MD;  [facility];  Service: Orthopedics;  Laterality: Left;  . TOTAL KNEE ARTHROPLASTY Left 07/28/2015   Procedure: LEFT TOTAL KNEE ARTHROPLASTY;  Surgeon: Gaynelle Arabian, MD;  [facility];  Service: Orthopedics;  Laterality: Left;  . TOTAL KNEE ARTHROPLASTY Right 08/06/2019   Procedure: TOTAL KNEE ARTHROPLASTY;  Surgeon: Gaynelle Arabian, MD;  [facility];  Service: Orthopedics;  Laterality: Right;  6min    There were no vitals filed for this visit.  Subjective Assessment - 10/05/19 0849    Subjective  "Good"    Currently in Pain?  No/denies    Pain Score  0-No pain                       [facility] Adult PT Treatment/Exercise - 10/05/19 0001      Ambulation/Gait   Stairs  Yes    Stairs Assistance  6: Modified independent (Device/Increase time);5: Supervision    Stair Management Technique  No rails;Alternating pattern    Number of Stairs  72    Height of Stairs  6    Gait Comments  3 flights, LLE appears stiff when decending stairs. Some every other step on the way up,  difficult for pt      Knee/Hip Exercises: Aerobic   Elliptical  I 15 R 7 2 fwd/ 2bckwd    Recumbent Bike  L0 x 4 min       Knee/Hip Exercises: Machines for Strengthening   Cybex Knee Extension  RLE 20# 2x10    Cybex Knee Flexion  RLE 35# 2x10    Cybex Leg Press  70# working on going deep posistion #6  2x15, RLE 20lb TKE  2x10        Knee/Hip Exercises: Seated   Sit to Sand  2 sets;10 reps;without UE support      Cryotherapy   Number Minutes Cryotherapy  10 Minutes    Cryotherapy Location  Knee    Type of Cryotherapy  Ice pack      Manual Therapy   Passive ROM  PROM knee flexion and extension               PT Short Term Goals - 08/15/19 1032      PT SHORT TERM GOAL #1   Title  independent with initial HEP    Status  Achieved        PT Long Term Goals - 09/24/19 7209  PT LONG TERM GOAL #1   Title  increase AROM of the right knee to 5-115 degrees flexion    Status  On-going      PT LONG TERM GOAL #2   Title  decrease pain 50%    Status  Achieved            Plan - 10/05/19 0949    Clinical Impression Statement  Overall pt did really well with today's treatment session. Little compensation on the bike today for the first time. Stair negotiation remains difficult ascending with every other step. Does well with progressive lowering on leg press, cues needed to prevent hip compensation. AROM taken after MT and he was able to flex his R knee to 110 degrees.    Stability/Clinical Decision Making  Evolving/Moderate complexity    Rehab Potential  Good    PT Frequency  3x / week    PT Treatment/Interventions  ADLs/Self Care Home Management;Cryotherapy;Electrical Stimulation;Therapeutic activities;Functional mobility training;Stair training;Gait training;Therapeutic exercise;Balance training;Neuromuscular re-education;Patient/family education;Manual techniques;Vasopneumatic Device    PT Next Visit Plan  keep pushing ROM and functional       Patient will  benefit from skilled therapeutic intervention in order to improve the following deficits and impairments:  Abnormal gait, Pain, Impaired tone, Decreased scar mobility, Decreased mobility, Cardiopulmonary status limiting activity, Decreased activity tolerance, Decreased endurance, Decreased range of motion, Decreased strength, Impaired flexibility, Increased edema, Difficulty walking, Decreased balance  Visit Diagnosis: Localized edema  Acute pain of right knee  Difficulty in walking, not elsewhere classified  Stiffness of right knee, not elsewhere classified     Problem List Patient Active Problem List   Diagnosis Date Noted  . Osteoarthritis of right knee 08/06/2019  . Postoperative stiffness of total knee replacement (HCC) 10/12/2015  . OA (osteoarthritis) of knee 07/28/2015    Grayce Sessions, PTA 10/05/2019, 9:53 AM  [facility] [place] [address] [place], [place], [address] Phone: [phone]   Fax:  [phone]  Name: Tim [name] MRN: [id number] Date of Birth: 1956/03/19

## 2019-10-08 ENCOUNTER — Ambulatory Visit: Payer: Commercial Managed Care - PPO | Admitting: Physical Therapy

## 2019-10-10 ENCOUNTER — Ambulatory Visit: Payer: Commercial Managed Care - PPO | Admitting: Physical Therapy

## 2019-10-10 ENCOUNTER — Other Ambulatory Visit: Payer: Self-pay

## 2019-10-10 DIAGNOSIS — M25661 Stiffness of right knee, not elsewhere classified: Secondary | ICD-10-CM

## 2019-10-10 NOTE — Therapy (Signed)
[facility] [address] [place], [place], [address] Phone: [phone]   Fax:  [phone]  Physical Therapy Treatment  Patient Details  Name: Tim [name] MRN: [id number] Date of Birth: Nov 29, 1956 Referring Provider (PT): Aluisio   Encounter Date: 10/10/2019  PT End of Session - 10/10/19 1114    Visit Number  23    PT Start Time  0847    PT Stop Time  0948    PT Time Calculation (min)  61 min       Past Medical History:  Diagnosis Date  . Arthritis   . Hypertension     Past Surgical History:  Procedure Laterality Date  . KNEE CLOSED REDUCTION Left 10/13/2015   Procedure: LEFT CLOSED MANIPULATION KNEE;  Surgeon: Ollen Gross, MD;  Location: [facility];  Service: Orthopedics;  Laterality: Left;  . TOTAL KNEE ARTHROPLASTY Left 07/28/2015   Procedure: LEFT TOTAL KNEE ARTHROPLASTY;  Surgeon: Ollen Gross, MD;  Location: [facility];  Service: Orthopedics;  Laterality: Left;  . TOTAL KNEE ARTHROPLASTY Right 08/06/2019   Procedure: TOTAL KNEE ARTHROPLASTY;  Surgeon: Ollen Gross, MD;  [facility]: [facility];  Service: Orthopedics;  Laterality: Right;     There were no vitals filed for this visit.  Subjective Assessment - 10/10/19 0848    Subjective  "feeling pretty good" "knee is a little stiff"    Currently in Pain?  No/denies         [facility] PT Assessment - 10/10/19 0001      AROM   Right Knee Extension  15    Right Knee Flexion  103                   [facility] Adult PT Treatment/Exercise - 10/10/19 0001      Ambulation/Gait   Stairs  Yes    Stairs Assistance  6: Modified independent (Device/Increase time)    Stair Management Technique  No rails;Alternating pattern    Number of Stairs  72    Height of Stairs  6      Knee/Hip Exercises: Aerobic   Elliptical  I 15 R 7 3 fwd/ 3bckwd    Recumbent Bike  L0 x 4 min       Knee/Hip Exercises: Machines for Strengthening   Cybex Knee Extension  RLE 20#  2x10    Cybex Knee Flexion  RLE 35# 2x10    Cybex Leg Press  70# working on going deep posistion #6  2x15, RLE 20lb TKE  2x10        Knee/Hip Exercises: Standing   Forward Step Up  Right;10 reps;Hand Hold: 0;Step Height: 8"   w/ green tband     Vasopneumatic   Number Minutes Vasopneumatic   15 minutes    Vasopnuematic Location   Knee    Vasopneumatic Pressure  Medium    Vasopneumatic Temperature   36      Manual Therapy   Passive ROM  PROM knee flexion and extension               PT Short Term Goals - 08/15/19 1032      PT SHORT TERM GOAL #1   Title  independent with initial HEP    Status  Achieved        PT Long Term Goals - 10/10/19 1112      PT LONG TERM GOAL #1   Title  increase AROM of the right knee to 5-115 degrees flexion  Status  On-going      PT LONG TERM GOAL #4   Title  go up and down stairs reciprocally    Status  Achieved            Plan - 10/10/19 1117    Clinical Impression Statement  Pt continues to reduce compensation on the bike. Pt able to reach level 6 on the leg press today. AROM taken after MT, he is able to flex R knee to 103 degrees. Pt needs cues for TKE on leg press.    Rehab Potential  Good    PT Frequency  3x / week    PT Duration  8 weeks    PT Treatment/Interventions  ADLs/Self Care Home Management;Cryotherapy;Electrical Stimulation;Therapeutic activities;Functional mobility training;Stair training;Gait training;Therapeutic exercise;Balance training;Neuromuscular re-education;Patient/family education;Manual techniques;Vasopneumatic Device    PT Next Visit Plan  keep pushing ROM and functional       Patient will benefit from skilled therapeutic intervention in order to improve the following deficits and impairments:  Abnormal gait, Pain, Impaired tone, Decreased scar mobility, Decreased mobility, Cardiopulmonary status limiting activity, Decreased activity tolerance, Decreased endurance, Decreased range of motion,  Decreased strength, Impaired flexibility, Increased edema, Difficulty walking, Decreased balance  Visit Diagnosis: Stiffness of right knee, not elsewhere classified     Problem List Patient Active Problem List   Diagnosis Date Noted  . Osteoarthritis of right knee 08/06/2019  . Postoperative stiffness of total knee replacement ([place]) 10/12/2015  . OA (osteoarthritis) of knee 07/28/2015    Barrett Henle, [place] 10/10/2019, 11:25 AM  [place], [place], [address] Phone: [phone]   Fax:  [phone]  Name: Tim [name] MRN: [id number] Date of Birth: 12/10/1956

## 2019-10-12 ENCOUNTER — Other Ambulatory Visit: Payer: Self-pay

## 2019-10-12 ENCOUNTER — Encounter: Payer: Self-pay | Admitting: Physical Therapy

## 2019-10-12 ENCOUNTER — Ambulatory Visit: Payer: Commercial Managed Care - PPO | Admitting: Physical Therapy

## 2019-10-12 DIAGNOSIS — R262 Difficulty in walking, not elsewhere classified: Secondary | ICD-10-CM

## 2019-10-12 DIAGNOSIS — M25661 Stiffness of right knee, not elsewhere classified: Secondary | ICD-10-CM | POA: Diagnosis not present

## 2019-10-12 DIAGNOSIS — R600 Localized edema: Secondary | ICD-10-CM

## 2019-10-12 DIAGNOSIS — M25561 Pain in right knee: Secondary | ICD-10-CM

## 2019-10-12 NOTE — Therapy (Signed)
[facility] [address] [place], [place], [address] Phone: [phone]   Fax:  [phone]  Physical Therapy Treatment  Patient Details  Name: Tim [name] MRN: [id number] Date of Birth: [birth year] Referring Provider (PT): Aluisio   Encounter Date: 10/12/2019  PT End of Session - 10/12/19 0852    Visit Number  24    Date for PT Re-Evaluation  10/10/19    PT Start Time  0800    PT Stop Time  0857    PT Time Calculation (min)  57 min    Activity Tolerance  Patient tolerated treatment well    Behavior During Therapy  [facility] for tasks assessed/performed       Past Medical History:  Diagnosis Date  . Arthritis   . Hypertension     Past Surgical History:  Procedure Laterality Date  . KNEE CLOSED REDUCTION Left 10/13/2015   Procedure: LEFT CLOSED MANIPULATION KNEE;  Surgeon: Ollen Gross, MD;  [facility];  Service: Orthopedics;  Laterality: Left;  . TOTAL KNEE ARTHROPLASTY Left 07/28/2015   Procedure: LEFT TOTAL KNEE ARTHROPLASTY;  Surgeon: Ollen Gross, MD;  [facility];  Service: Orthopedics;  Laterality: Left;  . TOTAL KNEE ARTHROPLASTY Right 08/06/2019   Procedure: TOTAL KNEE ARTHROPLASTY;  Surgeon: Ollen Gross, MD;  [facility];  Service: Orthopedics;  Laterality: Right;     There were no vitals filed for this visit.  Subjective Assessment - 10/12/19 0802    Subjective  "Im good"    Currently in Pain?  No/denies                       [facility] Adult PT Treatment/Exercise - 10/12/19 0001      Ambulation/Gait   Stairs  Yes    Stairs Assistance  6: Modified independent (Device/Increase time)    Stair Management Technique  No rails;Alternating pattern    Number of Stairs  72    Height of Stairs  6    Gait Comments  3 flights, LLE appears stiff when decending stairs. Some every other step on the way up, difficult for pt      Knee/Hip Exercises: Aerobic   Elliptical  I 15  R 7 3 fwd/ 3bckwd    Recumbent Bike  L0 x 3 min       Knee/Hip Exercises: Machines for Strengthening   Cybex Knee Extension  RLE 20# 2x10    Cybex Knee Flexion  RLE 35# 2x10    Cybex Leg Press  30lb Level 7 x15, RLE 40lb 2x10 at level 8       Knee/Hip Exercises: Standing   Heel Raises  Both;2 sets;15 reps;2 seconds    Lateral Step Up  Right;2 sets;10 reps;Hand Hold: 0;Step Height: 8"   L hip flex      Knee/Hip Exercises: Seated   Sit to Sand  2 sets;10 reps;without UE support      Cryotherapy   Number Minutes Cryotherapy  10 Minutes    Cryotherapy Location  Knee    Type of Cryotherapy  Ice pack      Manual Therapy   Passive ROM  R knee ext with some end range holding               PT Short Term Goals - 08/15/19 1032      PT SHORT TERM GOAL #1   Title  independent with initial HEP    Status  Achieved        PT Long Term Goals - 10/10/19 1112      PT LONG TERM GOAL #1   Title  increase AROM of the right knee to 5-115 degrees flexion    Status  On-going      PT LONG TERM GOAL #4   Title  go up and down stairs reciprocally    Status  Achieved            Plan - 10/12/19 0854    Clinical Impression Statement  Pt continues to have the most difficulty with R knee extension. Despite deficits he functions really well. No reports of increase pain. Some difficulty ascending stairs every other step. SL on leg press was very taxing.    Stability/Clinical Decision Making  Evolving/Moderate complexity    Rehab Potential  Good    PT Frequency  3x / week    PT Duration  8 weeks    PT Treatment/Interventions  ADLs/Self Care Home Management;Cryotherapy;Electrical Stimulation;Therapeutic activities;Functional mobility training;Stair training;Gait training;Therapeutic exercise;Balance training;Neuromuscular re-education;Patient/family education;Manual techniques;Vasopneumatic Device    PT Next Visit Plan  keep pushing ROM and functional       Patient will benefit from  skilled therapeutic intervention in order to improve the following deficits and impairments:  Abnormal gait, Pain, Impaired tone, Decreased scar mobility, Decreased mobility, Cardiopulmonary status limiting activity, Decreased activity tolerance, Decreased endurance, Decreased range of motion, Decreased strength, Impaired flexibility, Increased edema, Difficulty walking, Decreased balance  Visit Diagnosis: Stiffness of right knee, not elsewhere classified  Localized edema  Acute pain of right knee  Difficulty in walking, not elsewhere classified     Problem List Patient Active Problem List   Diagnosis Date Noted  . Osteoarthritis of right knee 08/06/2019  . Postoperative stiffness of total knee replacement ([place]) 10/12/2015  . OA (osteoarthritis) of knee 07/28/2015    Scot Jun, PTA 10/12/2019, 8:59 AM  [place], [place], [address] Phone: [phone]   Fax:  [phone]  Name: Tim [name] MRN: [id number] Date of Birth: 1956/04/08

## 2019-10-15 ENCOUNTER — Encounter: Payer: Self-pay | Admitting: Physical Therapy

## 2019-10-15 ENCOUNTER — Other Ambulatory Visit: Payer: Self-pay

## 2019-10-15 ENCOUNTER — Ambulatory Visit: Payer: Commercial Managed Care - PPO | Admitting: Physical Therapy

## 2019-10-15 DIAGNOSIS — M25661 Stiffness of right knee, not elsewhere classified: Secondary | ICD-10-CM | POA: Diagnosis not present

## 2019-10-15 DIAGNOSIS — M25561 Pain in right knee: Secondary | ICD-10-CM

## 2019-10-15 DIAGNOSIS — R600 Localized edema: Secondary | ICD-10-CM

## 2019-10-15 DIAGNOSIS — R262 Difficulty in walking, not elsewhere classified: Secondary | ICD-10-CM

## 2019-10-15 NOTE — Addendum Note (Signed)
Addended by: Sumner Boast on: 10/15/2019 03:35 PM   Modules accepted: Orders

## 2019-10-15 NOTE — Therapy (Signed)
[place], [place], [address] Phone: [phone]   Fax:  [phone]  Physical Therapy Treatment  Patient Details  Name: Tim [name] MRN: [id number] Date of Birth: 07-Jan-1956 Referring Provider (PT): Aluisio   Encounter Date: 10/15/2019  PT End of Session - 10/15/19 0848    Visit Number  25    Date for PT Re-Evaluation  10/10/19    PT Start Time  0802    PT Stop Time  0900    PT Time Calculation (min)  58 min    Activity Tolerance  Patient tolerated treatment well    Behavior During Therapy  [facility] for tasks assessed/performed       Past Medical History:  Diagnosis Date  . Arthritis   . Hypertension     Past Surgical History:  Procedure Laterality Date  . KNEE CLOSED REDUCTION Left 10/13/2015   Procedure: LEFT CLOSED MANIPULATION KNEE;  Surgeon: Gaynelle Arabian, MD;  [facility];  Service: Orthopedics;  Laterality: Left;  . TOTAL KNEE ARTHROPLASTY Left 07/28/2015   Procedure: LEFT TOTAL KNEE ARTHROPLASTY;  Surgeon: Gaynelle Arabian, MD;  [facility];  Service: Orthopedics;  Laterality: Left;  . TOTAL KNEE ARTHROPLASTY Right 08/06/2019   Procedure: TOTAL KNEE ARTHROPLASTY;  Surgeon: Gaynelle Arabian, MD;  [facility];  Service: Orthopedics;  Laterality: Right;  45mn    There were no vitals filed for this visit.  Subjective Assessment - 10/15/19 0803    Subjective  "I am doing good"    Currently in Pain?  No/denies                       [facility] - [facility] 0001      Knee/Hip Exercises: Machines for Strengthening   Cybex Knee Extension  RLE 20# 2x10    Cybex Knee Flexion  RLE 45# 2x10    Cybex Leg Press  80# 2x15, RLE 20lb TKE  2x10        Knee/Hip Exercises: Standing   Heel Raises  Both;2 sets;15 reps;2 seconds    Lateral Step Up  Both;1 set;15 reps;Hand Hold: 0;Step Height: 8"   Opposite hip flex   Forward Step Up  Right;1 set;10  reps;Hand Hold: 0   mat table      Cryotherapy   Number Minutes Cryotherapy  10 Minutes    Cryotherapy Location  Knee    Type of Cryotherapy  Ice pack      Manual Therapy   Passive ROM  R knee ext with some end range holding               PT Short Term Goals - 08/15/19 1032      PT SHORT TERM GOAL #1   Title  independent with initial HEP    Status  Achieved        PT Long Term Goals - 10/15/19 0848      PT LONG TERM GOAL #1   Title  increase AROM of the right knee to 5-115 degrees flexion    Status  On-going      PT LONG TERM GOAL #2   Title  decrease pain 50%    Status  Achieved      PT LONG TERM GOAL #3   Title  walk community distances without assistive device and minimal deviation    Status  Partially Met      PT LONG TERM GOAL #  4   Title  go up and down stairs reciprocally    Status  Achieved            Plan - 10/15/19 0850    Clinical Impression Statement  Pt continues to do functionally well.  He had some difficulty with step up on mat table due to weakness. Good ROM in leg press demo ing good strength as well. No reports of increase pain. Cues not to compensate with heel raises.    Stability/Clinical Decision Making  Evolving/Moderate complexity    Rehab Potential  Good    PT Frequency  3x / week    PT Treatment/Interventions  ADLs/Self Care Home Management;Cryotherapy;Electrical Stimulation;Therapeutic activities;Functional mobility training;Stair training;Gait training;Therapeutic exercise;Balance training;Neuromuscular re-education;Patient/family education;Manual techniques;Vasopneumatic Device    PT Next Visit Plan  keep pushing ROM and functional       Patient will benefit from skilled therapeutic intervention in order to improve the following deficits and impairments:     Visit Diagnosis: Stiffness of right knee, not elsewhere classified  Localized edema  Acute pain of right knee  Difficulty in walking, not elsewhere  classified     Problem List Patient Active Problem List   Diagnosis Date Noted  . Osteoarthritis of right knee 08/06/2019  . Postoperative stiffness of total knee replacement ([place]) 10/12/2015  . OA (osteoarthritis) of knee 07/28/2015    Scot Jun, PTA 10/15/2019, 8:54 AM  [place], [place], [address] Phone: [phone]   Fax:  [phone]  Name: Tim [name] MRN: [id number] Date of Birth: 07-12-1956

## 2019-10-17 ENCOUNTER — Ambulatory Visit: Payer: Commercial Managed Care - PPO | Admitting: Physical Therapy

## 2019-10-17 ENCOUNTER — Other Ambulatory Visit: Payer: Self-pay

## 2019-10-17 ENCOUNTER — Encounter: Payer: Self-pay | Admitting: Physical Therapy

## 2019-10-17 DIAGNOSIS — R262 Difficulty in walking, not elsewhere classified: Secondary | ICD-10-CM

## 2019-10-17 DIAGNOSIS — M25661 Stiffness of right knee, not elsewhere classified: Secondary | ICD-10-CM | POA: Diagnosis not present

## 2019-10-17 DIAGNOSIS — M25561 Pain in right knee: Secondary | ICD-10-CM

## 2019-10-17 NOTE — Therapy (Signed)
[place], [place], [address] Phone: [phone]   Fax:  [phone]  Physical Therapy Treatment  Patient Details  Name: Tim [name] MRN: [id number] Date of Birth: 1956/08/05 Referring Provider (PT): Aluisio   Encounter Date: 10/17/2019  PT End of Session - 10/17/19 0925    Visit Number  26    Date for PT Re-Evaluation  11/11/19    PT Start Time  0845    PT Stop Time  0930    PT Time Calculation (min)  45 min    Activity Tolerance  Patient tolerated treatment well    Behavior During Therapy  [facility] for tasks assessed/performed       Past Medical History:  Diagnosis Date  . Arthritis   . Hypertension     Past Surgical History:  Procedure Laterality Date  . KNEE CLOSED REDUCTION Left 10/13/2015   Procedure: LEFT CLOSED MANIPULATION KNEE;  Surgeon: Gaynelle Arabian, MD;  [facility];  Service: Orthopedics;  Laterality: Left;  . TOTAL KNEE ARTHROPLASTY Left 07/28/2015   Procedure: LEFT TOTAL KNEE ARTHROPLASTY;  Surgeon: Gaynelle Arabian, MD;  [facility];  Service: Orthopedics;  Laterality: Left;  . TOTAL KNEE ARTHROPLASTY Right 08/06/2019   Procedure: TOTAL KNEE ARTHROPLASTY;  Surgeon: Gaynelle Arabian, MD;  [facility];  Service: Orthopedics;  Laterality: Right;  6mn    There were no vitals filed for this visit.  Subjective Assessment - 10/17/19 0853    Subjective  Patient reports that he is doing well, sees MD tomorrow, is planning on return to work on Monday, reports fatigue is the biggest issue and some stiffnes    Currently in Pain?  No/denies         [facility]      AROM   Right Knee Extension  10    Right Knee Flexion  109      PROM   Right Knee Extension  5    Right Knee Flexion  117                   [facility] Adult PT Treatment/Exercise - 10/17/19 0001      Ambulation/Gait   Gait Comments  had him climb a ladder, stairs step  over step, and outside 1.5 laps around the building      Knee/Hip Exercises: Aerobic   Elliptical  I 15 R 7 3 fwd/ 3bckwd    Tread Mill  turned off push and pull 20 seconds    Other Aerobic  resisted gait all directions      Knee/Hip Exercises: Machines for Strengthening   Cybex Leg Press  working on depth to get better ROM in to flexion, 40#      Manual Therapy   Manual Therapy  Passive ROM    Passive ROM  flexion and extension to end range               PT Short Term Goals - 08/15/19 1032      PT SHORT TERM GOAL #1   Title  independent with initial HEP    Status  Achieved        PT Long Term Goals - 10/17/19 0926      PT LONG TERM GOAL #1   Title  increase AROM of the right knee to 5-115 degrees flexion    Status  Partially Met      PT LONG TERM GOAL #2  Title  decrease pain 50%    Status  Achieved      PT LONG TERM GOAL #3   Title  walk community distances without assistive device and minimal deviation    Status  Achieved      PT LONG TERM GOAL #4   Title  go up and down stairs reciprocally    Status  Achieved            Plan - 10/17/19 0926    Clinical Impression Statement  Patient doing much better, less pain, increased ROM, still some c/o tightness and stiffness, also has issues with endurance and fatigue but feels like he can return to work    PT Next Visit Plan  will possibly d/c after he sees the MD    Consulted and Agree with Plan of Care  Patient       Patient will benefit from skilled therapeutic intervention in order to improve the following deficits and impairments:  Abnormal gait, Pain, Impaired tone, Decreased scar mobility, Decreased mobility, Cardiopulmonary status limiting activity, Decreased activity tolerance, Decreased endurance, Decreased range of motion, Decreased strength, Impaired flexibility, Increased edema, Difficulty walking, Decreased balance  Visit Diagnosis: Stiffness of right knee, not elsewhere classified  Acute  pain of right knee  Difficulty in walking, not elsewhere classified     Problem List Patient Active Problem List   Diagnosis Date Noted  . Osteoarthritis of right knee 08/06/2019  . Postoperative stiffness of total knee replacement ([place]) 10/12/2015  . OA (osteoarthritis) of knee 07/28/2015    Sumner Boast., PT 10/17/2019, 9:27 AM  [place] [address] [place], [place], [address] Phone: [phone]   Fax:  [phone]  Name: Tim [name] MRN: [id number] Date of Birth: 05-01-1956

## 2019-10-19 ENCOUNTER — Ambulatory Visit: Payer: Commercial Managed Care - PPO | Admitting: Physical Therapy

## 2020-02-01 ENCOUNTER — Ambulatory Visit: Payer: Commercial Managed Care - PPO

## 2020-02-14 ENCOUNTER — Ambulatory Visit: Payer: Commercial Managed Care - PPO | Attending: Internal Medicine

## 2020-02-14 DIAGNOSIS — Z23 Encounter for immunization: Secondary | ICD-10-CM

## 2020-02-14 NOTE — Progress Notes (Signed)
   Covid-19 Vaccination Clinic  Name:  Tim [name]    MRN: [id number] DOB: 24-Feb-1956  02/14/2020  [name] was observed post Covid-19 immunization for 15 minutes without incidence. He was provided with Vaccine Information Sheet and instruction to access the V-Safe system.   [name] was instructed to call 911 with any severe reactions post vaccine: Marland Kitchen Difficulty breathing  . Swelling of your face and throat  . A fast heartbeat  . A bad rash all over your body  . Dizziness and weakness    Immunizations Administered    Name Date Dose VIS Date Route   Pfizer COVID-19 Vaccine 02/14/2020 10:37 AM 0.3 mL 11/30/2019 Intramuscular   Manufacturer: [employer], [employer]   Lot: [id number]   NDC: [address]-8

## 2020-03-05 ENCOUNTER — Ambulatory Visit: Payer: Commercial Managed Care - PPO | Attending: Internal Medicine

## 2020-03-05 DIAGNOSIS — Z23 Encounter for immunization: Secondary | ICD-10-CM

## 2020-03-05 NOTE — Progress Notes (Signed)
   Covid-19 Vaccination Clinic  Name:  Claud Gowan    MRN: [id number] DOB: 1956-02-20  03/05/2020  Mr. Fendley was observed post Covid-19 immunization for 15 minutes without incident. He was provided with Vaccine Information Sheet and instruction to access the V-Safe system.   Mr. Bhalla was instructed to call 911 with any severe reactions post vaccine: Marland Kitchen Difficulty breathing  . Swelling of face and throat  . A fast heartbeat  . A bad rash all over body  . Dizziness and weakness   Immunizations Administered    Name Date Dose VIS Date Route   Pfizer COVID-19 Vaccine 03/05/2020  3:20 PM 0.3 mL 11/30/2019 Intramuscular   Manufacturer: [employer], [employer]   Lot: [id number]   NDC: [id number]

## 2020-04-12 IMAGING — NM NM THYROID IMAGING W/ UPTAKE MULTI (4&24 HR)
4 series · 4 of 4 positions shown · non-contrast
Comparison: None

CLINICAL DATA: Hyperthyroidism

EXAM:
THYROID SCAN AND UPTAKE - 4 AND 24 HOURS
TECHNIQUE: Following oral administration of F-IBN capsule, anterior planar
imaging was acquired at 24 hours. Thyroid uptake was calculated with
a thyroid probe at 4-6 hours and 24 hours.
RADIOPHARMACEUTICALS:  459 uCi F-IBN sodium iodide p.o.

[iu thyroid uptake i123 · 3.10mm/px · 1 of 1 slices shown (1 of 4)]
[im 1/1]
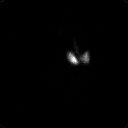

[iu thyroid uptake i123 · 3.10mm/px · 1 of 1 slices shown (2 of 4)]
[im 1/1]
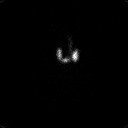

[iu thyroid uptake i123 · 3.10mm/px · 1 of 1 slices shown (3 of 4)]
[im 1/1]
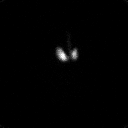

[iu thyroid uptake i123 · 3.10mm/px · 1 of 1 slices shown (4 of 4)]
[im 1/1]
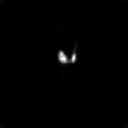

[4 of 4 positions shown; findings below may reference images not displayed]

FINDINGS: Homogeneous tracer distribution in both thyroid lobes.

No focal areas of increased or decreased tracer localization seen.

Subtle linear increased tracer accumulation at the anterior aspect
of the cervical region at the midline question thin linear pyramidal
lobe.

4 hour F-IBN uptake = 12.1% (normal 5-20%)

24 hour F-IBN uptake = 28.3% (normal 10-30%)
IMPRESSION: Normal 4 hour and 24 hour radio iodine uptakes.

No evidence of thyroid nodules.

Suspect thin faint pyramidal lobe.

## 2022-02-19 DIAGNOSIS — H401131 Primary open-angle glaucoma, bilateral, mild stage: Secondary | ICD-10-CM | POA: Diagnosis not present

## 2022-04-19 DIAGNOSIS — H401131 Primary open-angle glaucoma, bilateral, mild stage: Secondary | ICD-10-CM | POA: Diagnosis not present

## 2022-04-23 DIAGNOSIS — E782 Mixed hyperlipidemia: Secondary | ICD-10-CM | POA: Diagnosis not present

## 2022-04-23 DIAGNOSIS — I10 Essential (primary) hypertension: Secondary | ICD-10-CM | POA: Diagnosis not present

## 2022-04-23 DIAGNOSIS — E1122 Type 2 diabetes mellitus with diabetic chronic kidney disease: Secondary | ICD-10-CM | POA: Diagnosis not present

## 2022-04-23 DIAGNOSIS — E890 Postprocedural hypothyroidism: Secondary | ICD-10-CM | POA: Diagnosis not present

## 2022-04-23 DIAGNOSIS — N182 Chronic kidney disease, stage 2 (mild): Secondary | ICD-10-CM | POA: Diagnosis not present

## 2022-05-05 DIAGNOSIS — E1165 Type 2 diabetes mellitus with hyperglycemia: Secondary | ICD-10-CM | POA: Diagnosis not present

## 2022-05-05 DIAGNOSIS — N182 Chronic kidney disease, stage 2 (mild): Secondary | ICD-10-CM | POA: Diagnosis not present

## 2022-05-05 DIAGNOSIS — E782 Mixed hyperlipidemia: Secondary | ICD-10-CM | POA: Diagnosis not present

## 2022-05-24 DIAGNOSIS — E890 Postprocedural hypothyroidism: Secondary | ICD-10-CM | POA: Diagnosis not present

## 2022-06-15 DIAGNOSIS — Z20822 Contact with and (suspected) exposure to covid-19: Secondary | ICD-10-CM | POA: Diagnosis not present

## 2022-06-15 DIAGNOSIS — I10 Essential (primary) hypertension: Secondary | ICD-10-CM | POA: Diagnosis not present

## 2022-06-15 DIAGNOSIS — U071 COVID-19: Secondary | ICD-10-CM | POA: Diagnosis not present

## 2022-10-13 DIAGNOSIS — R5383 Other fatigue: Secondary | ICD-10-CM | POA: Diagnosis not present

## 2022-10-13 DIAGNOSIS — D649 Anemia, unspecified: Secondary | ICD-10-CM | POA: Diagnosis not present

## 2022-10-13 DIAGNOSIS — Z125 Encounter for screening for malignant neoplasm of prostate: Secondary | ICD-10-CM | POA: Diagnosis not present

## 2022-10-13 DIAGNOSIS — E1165 Type 2 diabetes mellitus with hyperglycemia: Secondary | ICD-10-CM | POA: Diagnosis not present

## 2022-10-13 DIAGNOSIS — E782 Mixed hyperlipidemia: Secondary | ICD-10-CM | POA: Diagnosis not present

## 2022-10-13 DIAGNOSIS — N182 Chronic kidney disease, stage 2 (mild): Secondary | ICD-10-CM | POA: Diagnosis not present

## 2022-10-14 DIAGNOSIS — H401131 Primary open-angle glaucoma, bilateral, mild stage: Secondary | ICD-10-CM | POA: Diagnosis not present

## 2022-10-20 DIAGNOSIS — I10 Essential (primary) hypertension: Secondary | ICD-10-CM | POA: Diagnosis not present

## 2022-10-20 DIAGNOSIS — E890 Postprocedural hypothyroidism: Secondary | ICD-10-CM | POA: Diagnosis not present

## 2022-10-20 DIAGNOSIS — E1122 Type 2 diabetes mellitus with diabetic chronic kidney disease: Secondary | ICD-10-CM | POA: Diagnosis not present

## 2022-10-20 DIAGNOSIS — N182 Chronic kidney disease, stage 2 (mild): Secondary | ICD-10-CM | POA: Diagnosis not present

## 2022-10-20 DIAGNOSIS — Z23 Encounter for immunization: Secondary | ICD-10-CM | POA: Diagnosis not present

## 2022-10-20 DIAGNOSIS — Z0000 Encounter for general adult medical examination without abnormal findings: Secondary | ICD-10-CM | POA: Diagnosis not present

## 2022-10-20 DIAGNOSIS — E782 Mixed hyperlipidemia: Secondary | ICD-10-CM | POA: Diagnosis not present

## 2022-11-25 DIAGNOSIS — H524 Presbyopia: Secondary | ICD-10-CM | POA: Diagnosis not present

## 2022-11-25 DIAGNOSIS — H5203 Hypermetropia, bilateral: Secondary | ICD-10-CM | POA: Diagnosis not present

## 2022-11-25 DIAGNOSIS — H52203 Unspecified astigmatism, bilateral: Secondary | ICD-10-CM | POA: Diagnosis not present

## 2023-02-23 DIAGNOSIS — E782 Mixed hyperlipidemia: Secondary | ICD-10-CM | POA: Diagnosis not present

## 2023-02-23 DIAGNOSIS — E1122 Type 2 diabetes mellitus with diabetic chronic kidney disease: Secondary | ICD-10-CM | POA: Diagnosis not present

## 2023-02-23 DIAGNOSIS — N182 Chronic kidney disease, stage 2 (mild): Secondary | ICD-10-CM | POA: Diagnosis not present

## 2023-02-23 DIAGNOSIS — I10 Essential (primary) hypertension: Secondary | ICD-10-CM | POA: Diagnosis not present

## 2023-03-02 DIAGNOSIS — E1165 Type 2 diabetes mellitus with hyperglycemia: Secondary | ICD-10-CM | POA: Diagnosis not present

## 2023-03-02 DIAGNOSIS — E11649 Type 2 diabetes mellitus with hypoglycemia without coma: Secondary | ICD-10-CM | POA: Diagnosis not present

## 2023-03-02 DIAGNOSIS — E1122 Type 2 diabetes mellitus with diabetic chronic kidney disease: Secondary | ICD-10-CM | POA: Diagnosis not present

## 2023-03-02 DIAGNOSIS — E782 Mixed hyperlipidemia: Secondary | ICD-10-CM | POA: Diagnosis not present

## 2023-03-02 DIAGNOSIS — Z23 Encounter for immunization: Secondary | ICD-10-CM | POA: Diagnosis not present

## 2023-04-14 DIAGNOSIS — H401131 Primary open-angle glaucoma, bilateral, mild stage: Secondary | ICD-10-CM | POA: Diagnosis not present

## 2023-05-25 DIAGNOSIS — I10 Essential (primary) hypertension: Secondary | ICD-10-CM | POA: Diagnosis not present

## 2023-05-25 DIAGNOSIS — E890 Postprocedural hypothyroidism: Secondary | ICD-10-CM | POA: Diagnosis not present

## 2023-05-25 DIAGNOSIS — E118 Type 2 diabetes mellitus with unspecified complications: Secondary | ICD-10-CM | POA: Diagnosis not present

## 2023-05-25 DIAGNOSIS — E782 Mixed hyperlipidemia: Secondary | ICD-10-CM | POA: Diagnosis not present

## 2023-05-25 DIAGNOSIS — N182 Chronic kidney disease, stage 2 (mild): Secondary | ICD-10-CM | POA: Diagnosis not present

## 2023-05-25 DIAGNOSIS — R5383 Other fatigue: Secondary | ICD-10-CM | POA: Diagnosis not present

## 2023-07-26 DIAGNOSIS — E890 Postprocedural hypothyroidism: Secondary | ICD-10-CM | POA: Diagnosis not present

## 2023-10-14 DIAGNOSIS — Z23 Encounter for immunization: Secondary | ICD-10-CM | POA: Diagnosis not present

## 2023-10-27 DIAGNOSIS — E118 Type 2 diabetes mellitus with unspecified complications: Secondary | ICD-10-CM | POA: Diagnosis not present

## 2023-10-27 DIAGNOSIS — R5383 Other fatigue: Secondary | ICD-10-CM | POA: Diagnosis not present

## 2023-10-27 DIAGNOSIS — E782 Mixed hyperlipidemia: Secondary | ICD-10-CM | POA: Diagnosis not present

## 2023-10-27 DIAGNOSIS — N182 Chronic kidney disease, stage 2 (mild): Secondary | ICD-10-CM | POA: Diagnosis not present

## 2023-10-27 DIAGNOSIS — I10 Essential (primary) hypertension: Secondary | ICD-10-CM | POA: Diagnosis not present

## 2023-11-03 DIAGNOSIS — E782 Mixed hyperlipidemia: Secondary | ICD-10-CM | POA: Diagnosis not present

## 2023-11-03 DIAGNOSIS — E1122 Type 2 diabetes mellitus with diabetic chronic kidney disease: Secondary | ICD-10-CM | POA: Diagnosis not present

## 2023-11-03 DIAGNOSIS — E890 Postprocedural hypothyroidism: Secondary | ICD-10-CM | POA: Diagnosis not present

## 2023-11-03 DIAGNOSIS — N182 Chronic kidney disease, stage 2 (mild): Secondary | ICD-10-CM | POA: Diagnosis not present

## 2023-11-03 DIAGNOSIS — Z125 Encounter for screening for malignant neoplasm of prostate: Secondary | ICD-10-CM | POA: Diagnosis not present

## 2023-11-03 DIAGNOSIS — I10 Essential (primary) hypertension: Secondary | ICD-10-CM | POA: Diagnosis not present

## 2023-11-03 DIAGNOSIS — E11649 Type 2 diabetes mellitus with hypoglycemia without coma: Secondary | ICD-10-CM | POA: Diagnosis not present

## 2023-11-03 DIAGNOSIS — Z0000 Encounter for general adult medical examination without abnormal findings: Secondary | ICD-10-CM | POA: Diagnosis not present

## 2023-11-14 DIAGNOSIS — Z1212 Encounter for screening for malignant neoplasm of rectum: Secondary | ICD-10-CM | POA: Diagnosis not present

## 2023-11-14 DIAGNOSIS — Z1211 Encounter for screening for malignant neoplasm of colon: Secondary | ICD-10-CM | POA: Diagnosis not present

## 2024-05-10 DIAGNOSIS — E890 Postprocedural hypothyroidism: Secondary | ICD-10-CM | POA: Diagnosis not present

## 2024-05-10 DIAGNOSIS — E1122 Type 2 diabetes mellitus with diabetic chronic kidney disease: Secondary | ICD-10-CM | POA: Diagnosis not present

## 2024-05-10 DIAGNOSIS — E11649 Type 2 diabetes mellitus with hypoglycemia without coma: Secondary | ICD-10-CM | POA: Diagnosis not present

## 2024-05-10 DIAGNOSIS — E782 Mixed hyperlipidemia: Secondary | ICD-10-CM | POA: Diagnosis not present

## 2024-05-10 DIAGNOSIS — N182 Chronic kidney disease, stage 2 (mild): Secondary | ICD-10-CM | POA: Diagnosis not present

## 2024-05-10 DIAGNOSIS — I10 Essential (primary) hypertension: Secondary | ICD-10-CM | POA: Diagnosis not present

## 2024-05-17 DIAGNOSIS — N182 Chronic kidney disease, stage 2 (mild): Secondary | ICD-10-CM | POA: Diagnosis not present

## 2024-05-17 DIAGNOSIS — I10 Essential (primary) hypertension: Secondary | ICD-10-CM | POA: Diagnosis not present

## 2024-05-17 DIAGNOSIS — E890 Postprocedural hypothyroidism: Secondary | ICD-10-CM | POA: Diagnosis not present

## 2024-05-17 DIAGNOSIS — E11649 Type 2 diabetes mellitus with hypoglycemia without coma: Secondary | ICD-10-CM | POA: Diagnosis not present

## 2024-05-17 DIAGNOSIS — E782 Mixed hyperlipidemia: Secondary | ICD-10-CM | POA: Diagnosis not present

## 2024-05-17 DIAGNOSIS — E1122 Type 2 diabetes mellitus with diabetic chronic kidney disease: Secondary | ICD-10-CM | POA: Diagnosis not present

## 2024-05-24 DIAGNOSIS — I10 Essential (primary) hypertension: Secondary | ICD-10-CM | POA: Diagnosis not present

## 2024-05-24 DIAGNOSIS — E890 Postprocedural hypothyroidism: Secondary | ICD-10-CM | POA: Diagnosis not present

## 2024-05-24 DIAGNOSIS — E118 Type 2 diabetes mellitus with unspecified complications: Secondary | ICD-10-CM | POA: Diagnosis not present

## 2024-06-04 DIAGNOSIS — H401131 Primary open-angle glaucoma, bilateral, mild stage: Secondary | ICD-10-CM | POA: Diagnosis not present

## 2024-10-09 DIAGNOSIS — Z23 Encounter for immunization: Secondary | ICD-10-CM | POA: Diagnosis not present

## 2024-11-01 DIAGNOSIS — E782 Mixed hyperlipidemia: Secondary | ICD-10-CM | POA: Diagnosis not present

## 2024-11-01 DIAGNOSIS — I10 Essential (primary) hypertension: Secondary | ICD-10-CM | POA: Diagnosis not present

## 2024-11-01 DIAGNOSIS — E890 Postprocedural hypothyroidism: Secondary | ICD-10-CM | POA: Diagnosis not present

## 2024-11-01 DIAGNOSIS — E11649 Type 2 diabetes mellitus with hypoglycemia without coma: Secondary | ICD-10-CM | POA: Diagnosis not present

## 2024-11-01 DIAGNOSIS — Z125 Encounter for screening for malignant neoplasm of prostate: Secondary | ICD-10-CM | POA: Diagnosis not present

## 2024-11-01 DIAGNOSIS — E1122 Type 2 diabetes mellitus with diabetic chronic kidney disease: Secondary | ICD-10-CM | POA: Diagnosis not present

## 2024-11-01 DIAGNOSIS — N182 Chronic kidney disease, stage 2 (mild): Secondary | ICD-10-CM | POA: Diagnosis not present

## 2024-11-08 DIAGNOSIS — E1122 Type 2 diabetes mellitus with diabetic chronic kidney disease: Secondary | ICD-10-CM | POA: Diagnosis not present

## 2024-11-08 DIAGNOSIS — E782 Mixed hyperlipidemia: Secondary | ICD-10-CM | POA: Diagnosis not present

## 2024-11-08 DIAGNOSIS — E11649 Type 2 diabetes mellitus with hypoglycemia without coma: Secondary | ICD-10-CM | POA: Diagnosis not present

## 2024-11-08 DIAGNOSIS — E890 Postprocedural hypothyroidism: Secondary | ICD-10-CM | POA: Diagnosis not present

## 2024-11-08 DIAGNOSIS — I10 Essential (primary) hypertension: Secondary | ICD-10-CM | POA: Diagnosis not present

## 2024-11-08 DIAGNOSIS — Z0000 Encounter for general adult medical examination without abnormal findings: Secondary | ICD-10-CM | POA: Diagnosis not present

## 2024-11-08 DIAGNOSIS — N182 Chronic kidney disease, stage 2 (mild): Secondary | ICD-10-CM | POA: Diagnosis not present
